# Patient Record
Sex: Male | Born: 1955 | Race: White | Hispanic: No | Marital: Married | Smoking: Former smoker
Health system: Southern US, Community
[De-identification: ages and names within clinical notes are randomized; demographics above are authoritative.]

## PROBLEM LIST (undated history)

## (undated) DIAGNOSIS — F32A Depression, unspecified: Secondary | ICD-10-CM

## (undated) DIAGNOSIS — C159 Malignant neoplasm of esophagus, unspecified: Secondary | ICD-10-CM

## (undated) DIAGNOSIS — C419 Malignant neoplasm of bone and articular cartilage, unspecified: Secondary | ICD-10-CM

## (undated) DIAGNOSIS — G4730 Sleep apnea, unspecified: Secondary | ICD-10-CM

## (undated) DIAGNOSIS — I2699 Other pulmonary embolism without acute cor pulmonale: Secondary | ICD-10-CM

## (undated) DIAGNOSIS — E785 Hyperlipidemia, unspecified: Secondary | ICD-10-CM

## (undated) DIAGNOSIS — F418 Other specified anxiety disorders: Secondary | ICD-10-CM

## (undated) DIAGNOSIS — I10 Essential (primary) hypertension: Secondary | ICD-10-CM

## (undated) DIAGNOSIS — F419 Anxiety disorder, unspecified: Secondary | ICD-10-CM

## (undated) DIAGNOSIS — C801 Malignant (primary) neoplasm, unspecified: Secondary | ICD-10-CM

## (undated) DIAGNOSIS — F329 Major depressive disorder, single episode, unspecified: Secondary | ICD-10-CM

## (undated) HISTORY — DX: Essential (primary) hypertension: I10

## (undated) HISTORY — PX: OTHER SURGICAL HISTORY: SHX169

## (undated) HISTORY — PX: CHOLECYSTECTOMY: SHX55

---

## 1997-07-30 ENCOUNTER — Ambulatory Visit (HOSPITAL_COMMUNITY): Admission: RE | Admit: 1997-07-30 | Discharge: 1997-07-30 | Payer: Self-pay | Admitting: Emergency Medicine

## 1997-07-30 ENCOUNTER — Emergency Department (HOSPITAL_COMMUNITY): Admission: EM | Admit: 1997-07-30 | Discharge: 1997-07-30 | Payer: Self-pay | Admitting: Emergency Medicine

## 1997-09-02 ENCOUNTER — Ambulatory Visit (HOSPITAL_COMMUNITY): Admission: RE | Admit: 1997-09-02 | Discharge: 1997-09-02 | Payer: Self-pay | Admitting: *Deleted

## 2009-11-12 ENCOUNTER — Emergency Department (HOSPITAL_COMMUNITY): Admission: EM | Admit: 2009-11-12 | Discharge: 2009-11-12 | Payer: Self-pay | Admitting: Emergency Medicine

## 2016-02-01 ENCOUNTER — Other Ambulatory Visit: Payer: Self-pay | Admitting: Family Medicine

## 2016-02-01 DIAGNOSIS — R0789 Other chest pain: Secondary | ICD-10-CM

## 2016-02-03 DIAGNOSIS — C801 Malignant (primary) neoplasm, unspecified: Secondary | ICD-10-CM

## 2016-02-03 HISTORY — DX: Malignant (primary) neoplasm, unspecified: C80.1

## 2016-02-09 ENCOUNTER — Ambulatory Visit
Admission: RE | Admit: 2016-02-09 | Discharge: 2016-02-09 | Disposition: A | Discharge status: Home / Self Care | Payer: BLUE CROSS/BLUE SHIELD | Source: Ambulatory Visit | Attending: Family Medicine | Admitting: Family Medicine

## 2016-02-09 DIAGNOSIS — R0789 Other chest pain: Secondary | ICD-10-CM

## 2016-02-09 MED ORDER — IOPAMIDOL (ISOVUE-300) INJECTION 61%
75.0000 mL | Freq: Once | INTRAVENOUS | Status: AC | PRN
  Administered 2016-02-09: 75 mL via INTRAVENOUS

## 2016-02-10 ENCOUNTER — Telehealth: Payer: Self-pay | Admitting: *Deleted

## 2016-02-10 ENCOUNTER — Other Ambulatory Visit: Payer: Self-pay

## 2016-02-10 NOTE — Telephone Encounter (Signed)
Oncology Nurse Navigator Documentation  Oncology Nurse Navigator Flowsheets 02/10/2016  Navigator Location [facility]  Referral date to RadOnc/MedOnc 02/10/2016  Navigator Encounter Type Introductory phone call  Abnormal Finding Date 02/09/2016  Spoke with patient and provided new patient appointment for 02/21/16 at 2:15/2:30 with Dr. Truitt Merle. Informed of location of [place], valet service, and registration process. Reminded to bring photo ID,  insurance cards and a current medication list, including supplements. Patient verbalizes understanding. Informed him that navigator called [facility] and he is scheduled to see Dr. Penelope Coop on 12/13, but if they can speak w/patient before, he may be able to arrange the EGD that same week so results will be available on 12/19 visit. Gave him the phone # for  [facility] and he will call when this conversation is completed. Tells RN he is able to swallow soft foods and liquids. Meat and pizza is difficult to swallow. The pain is managed with the Norco. Notified HIM of appointment.

## 2016-02-20 ENCOUNTER — Telehealth: Payer: Self-pay | Admitting: *Deleted

## 2016-02-20 NOTE — Telephone Encounter (Signed)
Call to [facility] to follow up on endoscopy report from 02/15/16 and pathology. Confirmed the upper endo was done. They will fax report. Pathology is pending. Stressed that we need this report by 12/19 at 2:30 pm when he sees Dr. Burr Medico. Verbal on the path will be adequate if printed report not available.

## 2016-02-21 ENCOUNTER — Ambulatory Visit (HOSPITAL_BASED_OUTPATIENT_CLINIC_OR_DEPARTMENT_OTHER): Payer: BLUE CROSS/BLUE SHIELD

## 2016-02-21 ENCOUNTER — Encounter: Payer: Self-pay | Admitting: *Deleted

## 2016-02-21 ENCOUNTER — Telehealth: Payer: Self-pay | Admitting: Hematology

## 2016-02-21 ENCOUNTER — Ambulatory Visit (HOSPITAL_BASED_OUTPATIENT_CLINIC_OR_DEPARTMENT_OTHER): Payer: BLUE CROSS/BLUE SHIELD | Admitting: Hematology

## 2016-02-21 ENCOUNTER — Encounter: Payer: Self-pay | Admitting: Hematology

## 2016-02-21 VITALS — BP 140/94 | HR 127 | Temp 97.6°F | Resp 16 | Wt 228.0 lb

## 2016-02-21 DIAGNOSIS — C155 Malignant neoplasm of lower third of esophagus: Secondary | ICD-10-CM

## 2016-02-21 DIAGNOSIS — C159 Malignant neoplasm of esophagus, unspecified: Secondary | ICD-10-CM

## 2016-02-21 DIAGNOSIS — R1310 Dysphagia, unspecified: Secondary | ICD-10-CM

## 2016-02-21 DIAGNOSIS — C7951 Secondary malignant neoplasm of bone: Secondary | ICD-10-CM

## 2016-02-21 DIAGNOSIS — R0789 Other chest pain: Secondary | ICD-10-CM

## 2016-02-21 DIAGNOSIS — R634 Abnormal weight loss: Secondary | ICD-10-CM

## 2016-02-21 LAB — CBC WITH DIFFERENTIAL/PLATELET
BASO%: 0.3 % (ref 0.0–2.0)
BASOS ABS: 0 10*3/uL (ref 0.0–0.1)
EOS ABS: 0.7 10*3/uL — AB (ref 0.0–0.5)
EOS%: 6.1 % (ref 0.0–7.0)
HEMATOCRIT: 45.3 % (ref 38.4–49.9)
HEMOGLOBIN: 15.8 g/dL (ref 13.0–17.1)
LYMPH%: 11.7 % — ABNORMAL LOW (ref 14.0–49.0)
MCH: 31.6 pg (ref 27.2–33.4)
MCHC: 34.9 g/dL (ref 32.0–36.0)
MCV: 90.6 fL (ref 79.3–98.0)
MONO#: 0.9 10*3/uL (ref 0.1–0.9)
MONO%: 7.3 % (ref 0.0–14.0)
NEUT#: 8.9 10*3/uL — ABNORMAL HIGH (ref 1.5–6.5)
NEUT%: 74.6 % (ref 39.0–75.0)
Platelets: 376 10*3/uL (ref 140–400)
RBC: 5 10*6/uL (ref 4.20–5.82)
RDW: 12.5 % (ref 11.0–14.6)
WBC: 12 10*3/uL — ABNORMAL HIGH (ref 4.0–10.3)
lymph#: 1.4 10*3/uL (ref 0.9–3.3)

## 2016-02-21 LAB — COMPREHENSIVE METABOLIC PANEL
ALBUMIN: 3.5 g/dL (ref 3.5–5.0)
ALK PHOS: 123 U/L (ref 40–150)
ALT: 55 U/L (ref 0–55)
AST: 47 U/L — ABNORMAL HIGH (ref 5–34)
Anion Gap: 11 mEq/L (ref 3–11)
BUN: 21.3 mg/dL (ref 7.0–26.0)
CALCIUM: 14.6 mg/dL — AB (ref 8.4–10.4)
CHLORIDE: 97 meq/L — AB (ref 98–109)
CO2: 32 mEq/L — ABNORMAL HIGH (ref 22–29)
Creatinine: 1.1 mg/dL (ref 0.7–1.3)
EGFR: 73 mL/min/{1.73_m2} — AB (ref >90)
Glucose: 110 mg/dl (ref 70–140)
POTASSIUM: 4 meq/L (ref 3.5–5.1)
SODIUM: 139 meq/L (ref 136–145)
Total Bilirubin: 0.6 mg/dL (ref 0.20–1.20)
Total Protein: 8.4 g/dL — ABNORMAL HIGH (ref 6.4–8.3)

## 2016-02-21 MED ORDER — MORPHINE SULFATE (CONCENTRATE) 10 MG/0.5ML PO SOLN: 5.0000 mg | ORAL | 0 refills | Status: DC | PRN

## 2016-02-21 MED FILL — MORPHINE SULF 100 MG/5 ML S: 100 | 20 days supply | Qty: 30 | Fill #0

## 2016-02-21 NOTE — Patient Instructions (Signed)
Care Plan Summary- 02/21/2016 Name:  Walter Craig      DOB:  03/18/1955 Your Medical Team:  Medical Oncologist:  Dr. Truitt Merle Radiation Oncologist:   Surgeon:    Type of Cancer: Esophageal Cancer  Stage/Grade: Stage IV *Exact staging of your cancer is based on size of the tumor, depth of invasion, involvement of lymph nodes or not, and whether or not the cancer has spread beyond the primary site   Recommendations: Based on information available as of today's consult. Recommendations may change depending on the results of further tests or exams. 1) Complete staging with PET and labs 2) Chemotherapy with FOLFOX (5FU, Leucovorin, Oxaliplatin) every 2 weeks (start week of January) 3) Work on your pain control-monitor for constipation Next Steps: 1) Labs today; PET scan in next 1-2 weeks (after authorized) 2)  Interventional radiology for port-they will call 3)  Chemo education class and see Dr. Burr Medico on 12/26 4)   Will work on dietician to see you soon     Questions? Merceda Elks, RN, BSN at [phone]. Manuela Schwartz is your Oncology Nurse Navigator and is available to assist you while you're receiving your medical care at [facility].

## 2016-02-21 NOTE — Telephone Encounter (Signed)
Lab added for today, per 02/21/16 los. Chemo class and follow up with Dr Burr Medico, was scheduled for 02/28/16. Nutrition appointment scheduled for 02/29/16 per 02/21/16 los. No orders entered for [facility] placement. Spoke with desk nurse, Janifer Adie, she stated that she will take care of it. Appointments scheduled per 02/21/16 los. A copy of the appointment schedule and AVS report, was given to the patient, per 02/21/16 los.

## 2016-02-21 NOTE — Progress Notes (Signed)
[place]  Telephone:([phone]) [phone] Fax:([phone]) [place] Note   Patient Care Team: Hulan Fess, MD as PCP - General (Family Medicine) Wonda Horner, MD as Consulting Physician (Gastroenterology) 02/21/2016  REFFERAL PHYSICIAN:  DR. Penelope Coop    No history exists.    CHIEF COMPLAINTS/PURPOSE OF CONSULTATION:  Newly diagnosed esophageal cancer   HISTORY OF PRESENTING ILLNESS:  Walter Craig 60 y.o. male is here because of His recently diagnosed metastatic esophageal cancer. He is accompanied by his sister-in-law to my clinic today. His daughter was on the phone during the clinic visit also.  He presented with bilateral chest and back pain in early February 08, 2023, after his brother's death. He was seen by his PCP. He had CXR which was negative and he took NSAIDs for the pain. He was referred to PT, bu this pain did not improve. A CT scan was subsequently obtained, which showed distal surgery. Thoracic esophagus masslike soft tissue thickening and irregularity up to his 2 cm, multiple mediastinal and osseous metastasis compatible with stage IV esophageal cancer. He was referred to S retinologist Dr. Penelope Coop, underwent EGD on 02/15/2016 which showed esophageal mucosal change consistent with long segment Barrett's esophagus, a large malignant mass was found in the distal esophagus, partially obstructing and 3/4 circumferential. Biopsies showed invasive adenocarcinoma.  He denies any dysphagia, he recently developed mid chest pain when he eats lately, better with soft and liquid diet. Pain 9/10, he takes hydrocordone 2 tab a day, not well controlled. He still works, he is a Chiropractor, desk job   He had not had BM for 8 days, probably secondary to his pain medication.  He has good appetite, but eats less due to dysphagea, he lost about 20 lbs in the past few months   He has three chidren, his daughter lives in [place], 2 sons living in [place]. His  brother Inocente Salles recently died from clinical carcinoma, and [name] is currently receiving Keytruda for her cholangiocarcinoma.  MEDICAL HISTORY:  Past Medical History:  Diagnosis Date  . Hypertension     SURGICAL HISTORY: Past Surgical History:  Procedure Laterality Date  . CHOLECYSTECTOMY    . left knee meniscus repare       SOCIAL HISTORY: Social History   Social History  . Marital status: Married    Spouse name: N/A  . Number of children: N/A  . Years of education: N/A   Occupational History  . Not on file.   Social History Main Topics  . Smoking status: Former Smoker    Packs/day: 1.50    Years: 30.00    Quit date: 03/05/2004  . Smokeless tobacco: Never Used  . Alcohol use 1.8 - 2.4 oz/week    1 Shots of liquor, 2 - 3 Cans of beer per week     Comment: daily   . Drug use:     Types: Marijuana  . Sexual activity: Not on file   Other Topics Concern  . 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  ALLERGIES:  has no allergies on file.  MEDICATIONS:  Current Outpatient Prescriptions  Medication Sig Dispense Refill  . pravastatin (PRAVACHOL) 80 MG tablet Take 80 mg by mouth daily.    Marland Kitchen ALPRAZolam (XANAX) 1 MG tablet Take 1 mg by mouth at bedtime  as needed.    . diclofenac (VOLTAREN) 75 MG EC tablet Take 1 tablet by mouth 2 (two) times daily as needed.  1  . FLUTICASONE FUROATE-VILANTEROL IN Inhale into the lungs.    . hydrochlorothiazide (HYDRODIURIL) 25 MG tablet Take 25 mg by mouth daily.  1  . HYDROcodone-acetaminophen (NORCO) 10-325 MG tablet Take 1 tablet by mouth 2 (two) times daily as needed.  0  . metoprolol succinate (TOPROL-XL) 25 MG 24 hr tablet Take 1 tablet by mouth daily.  1  . zolpidem (AMBIEN) 10 MG tablet Take 10 mg by mouth at bedtime as needed.  0   No current facility-administered medications for this  visit.     REVIEW OF SYSTEMS:   Constitutional: Denies fevers, chills or abnormal night sweats Eyes: Denies blurriness of vision, double vision or watery eyes Ears, nose, mouth, throat, and face: Denies mucositis or sore throat Respiratory: Denies cough, dyspnea or wheezes Cardiovascular: Denies palpitation, chest discomfort or lower extremity swelling Gastrointestinal:  Denies nausea, heartburn or change in bowel habits Skin: Denies abnormal skin rashes Lymphatics: Denies new lymphadenopathy or easy bruising Neurological:Denies numbness, tingling or new weaknesses Behavioral/Psych: Mood is stable, no new changes  All other systems were reviewed with the patient and are negative.  PHYSICAL EXAMINATION: ECOG PERFORMANCE STATUS: 1 - Symptomatic but completely ambulatory  Vitals:   02/21/16 1448  BP: (!) 140/94  Pulse: (!) 127  Resp: 16  Temp: 97.6 F (36.4 C)   Filed Weights   02/21/16 1448  Weight: 228 lb (103.4 kg)    GENERAL:alert, no distress and comfortable SKIN: skin color, texture, turgor are normal, no rashes or significant lesions EYES: normal, conjunctiva are pink and non-injected, sclera clear OROPHARYNX:no exudate, no erythema and lips, buccal mucosa, and tongue normal  NECK: supple, thyroid normal size, non-tender, without nodularity LYMPH:  no palpable lymphadenopathy in the cervical, axillary or inguinal LUNGS: clear to auscultation and percussion with normal breathing effort HEART: regular rate & rhythm and no murmurs and no lower extremity edema ABDOMEN:abdomen soft, non-tender and normal bowel sounds Musculoskeletal:no cyanosis of digits and no clubbing  PSYCH: alert & oriented x 3 with fluent speech NEURO: no focal motor/sensory deficits  LABORATORY DATA:  I have reviewed the data as listed CBC Latest Ref Rng & Units 02/21/2016  WBC 4.0 - 10.3 10e3/uL 12.0(H)  Hemoglobin 13.0 - 17.1 g/dL 15.8  Hematocrit 38.4 - 49.9 % 45.3  Platelets 140 - 400  10e3/uL 376     CMP Latest Ref Rng & Units 02/21/2016  Glucose 70 - 140 mg/dl 110  BUN 7.0 - 26.0 mg/dL 21.3  Creatinine 0.7 - 1.3 mg/dL 1.1  Sodium 136 - 145 mEq/L 139  Potassium 3.5 - 5.1 mEq/L 4.0  CO2 22 - 29 mEq/L 32(H)  Calcium 8.4 - 10.4 mg/dL 14.6(HH)  Total Protein 6.4 - 8.3 g/dL 8.4(H)  Total Bilirubin 0.20 - 1.20 mg/dL 0.60  Alkaline Phos 40 - 150 U/L 123  AST 5 - 34 U/L 47(H)  ALT 0 - 55 U/L 55    PATHOLOGY REPORT  Diagnosis 02/15/2016 Adenocarcinoma present involving at least laminal propria and arising in background of intestinal metaplasia associated high-grade granular dysplasia. Positive for lymphovascular invasion.   RADIOGRAPHIC STUDIES: I have personally reviewed the radiological images as listed and agreed with the findings in the report. Ct Chest W Contrast  Addendum Date: 02/09/2016   ADDENDUM REPORT: 02/09/2016 15:42 ADDENDUM: Study discussed by telephone with Dr. Lennette Bihari LITTLE on 02/09/2016 At  1528 hours. Electronically Signed   By: Genevie Ann M.D.   On: 02/09/2016 15:42   Result Date: 02/09/2016 CLINICAL DATA:  60 year old male with chest wall pain, she bilateral shoulder blade pain, low back pain. Symptoms for 3-4 weeks. Former smoker. Initial encounter. EXAM: CT CHEST WITH CONTRAST TECHNIQUE: Multidetector CT imaging of the chest was performed during intravenous contrast administration. CONTRAST:  54m ISOVUE-300 IOPAMIDOL (ISOVUE-300) INJECTION 61% COMPARISON:  Chest radiographs 01/05/2016. FINDINGS: Cardiovascular: Calcified coronary artery atherosclerosis. Minimal calcified aortic atherosclerosis. No pericardial effusion. Mediastinum/Nodes: Bulky and eccentric soft tissue thickening in the lower third of the thoracic esophagus which appears irregular and masslike. See sagittal image 100. Soft tissue thickening up to 2 cm. Associated posterior and subcarinal mediastinal lymphadenopathy, with increased size and number of lymph nodes. Nodes individually up to  13-16 mm short axis. Superimposed mild AP window and moderate right peritracheal adenopathy (right peritracheal node up to 15 mm). Lungs/Pleura: Major airways are patent. Areas of curvilinear subpleural and perihilar lung scarring mostly on the left. Two small 3-4 mm indeterminate left lung nodules (series 5, image 78 and image 91). No pleural effusion. Small probable intrapleural lymph node at the confluence of the right major and minor fissures (series 5, image 79 and coronal image 90). Other subtle nodularity along the pleural fissures is indeterminate. Upper Abdomen: The gastroesophageal junction appears normal. Negative visible stomach and other bowel in the upper abdomen. Mild retrocrural lymphadenopathy. Surgically absent gallbladder. Negative visible liver, spleen, pancreas, adrenal glands and kidneys. Musculoskeletal: Osseous metastatic disease. Destructive 3 x 5 cm lesion of the posterior right fifth rib (series 2, image 48). Lytic lesion of the posterior right seventh rib. Nearby mildly displaced right posterior lateral seventh rib fracture might be pathologic (series 5, image 77). 2.5 x 3.5 lytic metastasis anterior left third rib. Smaller lytic metastasis with pathologic fracture posterior left fifth rib (series 2, image 59). Mildly displaced probable pathologic fracture posterior lateral left third rib (image 43). Scattered vertebral metastases including multiple small lytic vertebral body metastases in the thoracic spine (e.g. T4 series 4, image 106). Larger lytic left T11 metastasis (series 2, image 125) without strong evidence of epidural extension. Similar lytic posterior element metastasis right L1 vertebra (series 2, image 154). IMPRESSION: 1. Constellation of abnormal posterior mediastinum and lytic osseous metastatic disease most compatible with stage IV esophageal carcinoma. 2. Distal third thoracic esophagus masslike soft tissue thickening and irregularity up to 2 cm. See sagittal image 100.  Recommend EGD. 3. Multiple destructive rib metastases with pathologic fractures. Multiple thoracic and upper lumbar vertebral metastases, though none with suspected epidural extension at this time. Electronically Signed: By: HGenevie AnnM.D. On: 02/09/2016 15:20    ASSESSMENT & PLAN:   60year old Caucasian male, with past medical history of hypertension, presented with bilateral chest pain and dysphagia  1. 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 -I recommend a PET scan for staging -I reviewed the natural history of metastatic esophageal cancer, unfortunately this is very aggressive malignancy, and it is not curable at this stage. His overall prognosis is very poor. -We discussed the role of palliative radiation, for his dysphagia and pain from bone metastasis. -Due  to the diffuse symptomatic metastasis, and he is still able to tolerate soft diet and adequate, I recommend him to start systemic chemotherapy as soon as possible. -We discussed the systemic therapy options, including chemotherapy, such as FOLFOX, carboplatin and Taxol, and immunotherapy Keytruda if his tumor expressed PD-L1, and biological agent such as Herceptin if his tumor expressed HER-2. -I will contact his path lab and order PD-L1 and HER2 IHC -Given his adenocarcinoma histology, I recommend him to start the first-line chemotherapy with FOLFOX --Chemotherapy consent: Side effects including but does not not limited to, fatigue, nausea, vomiting, diarrhea, hair loss, neuropathy, fluid retention, renal and kidney dysfunction, neutropenic fever, needed for blood transfusion,  bleeding, coronary artery spasm and heart attack were discussed with patient in great detail. He agrees to proceed. -Port placement -I'll tentatively starting his chemotherapy in 1-2 weeks   2. Hypercalcemia -His calcium is 14.6 today, likely secondary to his underlying malignancy -He has no mental status change, I recommend Zometa infusion tomorrow, along with IV fluids -On follow-up for his calcium level closely  3. Dysphagia, weight loss -I encouraged him to take nutrition supplement, such as ensure boost -Dietitian consult  4. Chest pain -Secondary to his bone metastasis -I recommend him to start morphine 5-10 mg every 6 hours as needed   *Recommendations: Based on information available as of today's consult. Recommendations may change depending on the results of further tests or exams. 1) Complete staging with PET and labs 2) Chemotherapy with FOLFOX (5FU, Leucovorin, Oxaliplatin) every 2 weeks (start week of January) 3) Work on your pain control-monitor for constipation Next Steps: 1) Labs today; PET scan in next 1-2 weeks (after authorized) 2)  Interventional radiology for port-they will call 3)  Chemo education class and see Dr. Burr Medico on 12/26 4)   Will work on dietician to see you soon ** No orders of the defined types were placed in this encounter.   All questions were answered. The patient knows to call the clinic with any problems, questions or concerns. I spent 55 minutes counseling the patient face to face. The total time spent in the appointment was 60 minutes and more than 50% was on counseling.     Truitt Merle, MD 02/21/2016 3:25 PM

## 2016-02-21 NOTE — Progress Notes (Signed)
Oncology Nurse Navigator Documentation  Oncology Nurse Navigator Flowsheets 02/21/2016  Navigator Location [facility]  Referral date to RadOnc/MedOnc -  Navigator Encounter Type Initial MedOnc  Abnormal Finding Date -  Patient Visit Type MedOnc;Initial  Treatment Phase Pre-Tx/Tx Discussion  Barriers/Navigation Needs Education;Coordination of Care  Education Newly Diagnosed Cancer Education;Pain/ Symptom Management;Understanding Cancer/ Treatment Options;Preparing for Upcoming Treatment  Interventions Education;Coordination of Care  Coordination of Care Radiology  Education Method Verbal;Written;Teach-back  Support Groups/Services GI Support Group;[place]  Acuity Level 2  Time Spent with Patient 30  Met with patient and sister-in-law, Manuela Schwartz after new patient visit. Explained the role of the GI Nurse Navigator and provided New Patient Packet with information on: 1.  Esophagus  Cancer--info provided on CEA test, PET scan, PAC and showed him what port looks like 2. Support groups 3. Advanced Directives 4. Fall Safety Plan Answered questions, reviewed current treatment plan using TEACH back and provided emotional support. Provided copy of current treatment plan. Discussed procedure for PET and needing prior authorization (message sent to managed care) and he is willing to go to [facility] if necessary. Informed him navigator will call him with the [facility] information and try to move his dietician appointment sooner as well. Currently he can swallow liquids and soft foods like yogurt, grits, puddings. Has lost 15-20 pounds since October according to his sister-in-law who has cholangiocarcinoma and is being treated in [place]. She adds that he has lost two siblings this year to cancer as well.   Merceda Elks, RN, BSN GI Oncology [place]

## 2016-02-22 ENCOUNTER — Ambulatory Visit (HOSPITAL_BASED_OUTPATIENT_CLINIC_OR_DEPARTMENT_OTHER): Payer: BLUE CROSS/BLUE SHIELD

## 2016-02-22 ENCOUNTER — Ambulatory Visit: Payer: BLUE CROSS/BLUE SHIELD | Admitting: Nutrition

## 2016-02-22 ENCOUNTER — Telehealth: Payer: Self-pay | Admitting: *Deleted

## 2016-02-22 ENCOUNTER — Encounter: Payer: Self-pay | Admitting: *Deleted

## 2016-02-22 VITALS — BP 156/97 | HR 121 | Temp 98.4°F | Resp 18

## 2016-02-22 DIAGNOSIS — C7951 Secondary malignant neoplasm of bone: Secondary | ICD-10-CM | POA: Diagnosis not present

## 2016-02-22 DIAGNOSIS — C159 Malignant neoplasm of esophagus, unspecified: Secondary | ICD-10-CM | POA: Insufficient documentation

## 2016-02-22 LAB — CEA (IN HOUSE-CHCC): CEA (CHCC-IN HOUSE): 17.75 ng/mL — AB (ref 0.00–5.00)

## 2016-02-22 MED ORDER — SODIUM CHLORIDE 0.9 % IV SOLN
Freq: Once | INTRAVENOUS | Status: AC
  Administered 2016-02-22: 13:00:00 via INTRAVENOUS

## 2016-02-22 MED ORDER — ZOLEDRONIC ACID 4 MG/100ML IV SOLN
4.0000 mg | Freq: Once | INTRAVENOUS | Status: AC
  Administered 2016-02-22: 4 mg via INTRAVENOUS
  Filled 2016-02-22: qty 100

## 2016-02-22 MED ORDER — HEPARIN SOD (PORK) LOCK FLUSH 100 UNIT/ML IV SOLN
500.0000 [IU] | Freq: Once | INTRAVENOUS | Status: AC | PRN
  Administered 2016-02-22: 500 [IU]
  Filled 2016-02-22: qty 5

## 2016-02-22 MED ORDER — SODIUM CHLORIDE 0.9% FLUSH
10.0000 mL | INTRAVENOUS | Status: DC | PRN
  Administered 2016-02-22: 10 mL
  Filled 2016-02-22: qty 10

## 2016-02-22 NOTE — Telephone Encounter (Signed)
Left VM for [facility] IR requesting port in 1-2 weeks. No blood thinners. Will be in office at 12:30 today and navigator can provide information at this time. Left direct # for return call.

## 2016-02-22 NOTE — Telephone Encounter (Signed)
Spoke with patient and let him know that labs from yesterday showed that his calcium is very high and we need to correct this.  He will come today at 12:15 for zometa and additional IVF.

## 2016-02-22 NOTE — Patient Instructions (Signed)
Dehydration, Adult Dehydration is when there is not enough fluid or water in your body. This happens when you lose more fluids than you take in. Dehydration can range from mild to very bad. It should be treated right away to keep it from getting very bad. Symptoms of mild dehydration may include:   Thirst.  Dry lips.  Slightly dry mouth.  Dry, warm skin.  Dizziness. Symptoms of moderate dehydration may include:   Very dry mouth.  Muscle cramps.  Dark pee (urine). Pee may be the color of tea.  Your body making less pee.  Your eyes making fewer tears.  Heartbeat that is uneven or faster than normal (palpitations).  Headache.  Light-headedness, especially when you stand up from sitting.  Fainting (syncope). Symptoms of very bad dehydration may include:   Changes in skin, such as:  Cold and clammy skin.  Blotchy (mottled) or pale skin.  Skin that does not quickly return to normal after being lightly pinched and let go (poor skin turgor).  Changes in body fluids, such as:  Feeling very thirsty.  Your eyes making fewer tears.  Not sweating when body temperature is high, such as in hot weather.  Your body making very little pee.  Changes in vital signs, such as:  Weak pulse.  Pulse that is more than 100 beats a minute when you are sitting still.  Fast breathing.  Low blood pressure.  Other changes, such as:  Sunken eyes.  Cold hands and feet.  Confusion.  Lack of energy (lethargy).  Trouble waking up from sleep.  Short-term weight loss.  Unconsciousness. Follow these instructions at home:  If told by your doctor, drink an ORS:  Make an ORS by using instructions on the package.  Start by drinking small amounts, about  cup (120 mL) every 5-10 minutes.  Slowly drink more until you have had the amount that your doctor said to have.  Drink enough clear fluid to keep your pee clear or pale yellow. If you were told to drink an ORS, finish the  ORS first, then start slowly drinking clear fluids. Drink fluids such as:  Water. Do not drink only water by itself. Doing that can make the salt (sodium) level in your body get too low (hyponatremia).  Ice chips.  Fruit juice that you have added water to (diluted).  Low-calorie sports drinks.  Avoid:  Alcohol.  Drinks that have a lot of sugar. These include high-calorie sports drinks, fruit juice that does not have water added, and soda.  Caffeine.  Foods that are greasy or have a lot of fat or sugar.  Take over-the-counter and prescription medicines only as told by your doctor.  Do not take salt tablets. Doing that can make the salt level in your body get too high (hypernatremia).  Eat foods that have minerals (electrolytes). Examples include bananas, oranges, potatoes, tomatoes, and spinach.  Keep all follow-up visits as told by your doctor. This is important. Contact a doctor if:  You have belly (abdominal) pain that:  Gets worse.  Stays in one area (localizes).  You have a rash.  You have a stiff neck.  You get angry or annoyed more easily than normal (irritability).  You are more sleepy than normal.  You have a harder time waking up than normal.  You feel:  Weak.  Dizzy.  Very thirsty.  You have peed (urinated) only a small amount of very dark pee during 6-8 hours. Get help right away if:  You   have symptoms of very bad dehydration.  You cannot drink fluids without throwing up (vomiting).  Your symptoms get worse with treatment.  You have a fever.  You have a very bad headache.  You are throwing up or having watery poop (diarrhea) and it:  Gets worse.  Does not go away.  You have blood or something green (bile) in your throw-up.  You have blood in your poop (stool). This may cause poop to look black and tarry.  You have not peed in 6-8 hours.  You pass out (faint).  Your heart rate when you are sitting still is more than 100 beats a  minute.  You have trouble breathing. This information is not intended to replace advice given to you by your health care provider. Make sure you discuss any questions you have with your health care provider. Document Released: 12/16/2008 Document Revised: 09/09/2015 Document Reviewed: 04/15/2015 Elsevier Interactive Patient Education  2017 [employer]vier Inc[employer]  

## 2016-02-22 NOTE — Progress Notes (Signed)
Oncology Nurse Navigator Documentation  Oncology Nurse Navigator Flowsheets 02/22/2016  Navigator Location [facility]  Referral date to RadOnc/MedOnc -  Navigator Encounter Type Treatment--IV fluids and zometa  Telephone -  Abnormal Finding Date -  Patient Visit Type MedOnc  Treatment Phase -  Barriers/Navigation Needs Coordination of Care;Education  Education Pain/ Symptom Management;Preparing for Upcoming Surgery/ Treatment--pain 8 before morphine and down to 5 afterwards. OK per MD to increase dose to 0.5 ml every 4 hours as needed and patient informed.  Interventions Coordination of Care  Coordination of Care Radiology--PAC scheduled for 12/22-provided appointment information and prep including need for driver  Education Method Verbal;Teach-back  Support Groups/Services -  Acuity -  Time Spent with Patient 15  Voice mail from daughter, Roxanna Mew left earlier today requesting to speak with navigator regarding treatment plans and prognosis [phone]). Attempted return call-no answer. Left VM that navigator called.

## 2016-02-22 NOTE — Progress Notes (Signed)
60 year old male diagnosed with esophageal cancer. He will receive FOLFOX.  He is a patient of Dr. Burr Medico.  Past medical history includes hypertension.  Medications include Xanax  Labs include albumin 3.5.  Height: 5 feet 10 inches. Weight: 228 pounds. Usual body weight: 242 pounds. BMI: 32.71.  Patient reports dysphagia with most foods but tolerates smooth foods such as applesauce and yogurt. He has been drinking smoothies every day. He would like to try a variety of oral nutrition supplements. He endorses 20 pound weight loss.  Nutrition diagnosis:  Unintended weight loss related to new diagnosis of esophagus cancer as evidenced by 6% weight loss from usual body weight.  Intervention: Educated patient to increase calories and protein utilizing high-calorie, high-protein foods in small frequent meals and snacks. Educated patient on pureing soft foods for increased tolerance. Recipes provided. Encouraged patient to continue oral nutrition supplements and provided samples and coupons. Questions were answered.  Teach back method used.  Contact information provided.  Monitoring, evaluation, goals: Patient will tolerate increased calories and protein to minimize weight loss.  Next visit: To be scheduled as needed.  **Disclaimer: This note was dictated with voice recognition software. Similar sounding words can inadvertently be transcribed and this note may contain transcription errors which may not have been corrected upon publication of note.**

## 2016-02-22 NOTE — Telephone Encounter (Signed)
Received call from patient's dtr Janett Billow.  She had questions re treatment and prognosis  in follow-up to his appt with Dr. Burr Medico yesterday.  I provided her [facility] phone number.  Gayleen Orem, RN, BSN, [facility] Nurse [place] [facility] [place] [phone]

## 2016-02-23 ENCOUNTER — Encounter: Payer: Self-pay | Admitting: Hematology

## 2016-02-23 ENCOUNTER — Telehealth: Payer: Self-pay | Admitting: *Deleted

## 2016-02-23 ENCOUNTER — Other Ambulatory Visit: Payer: Self-pay | Admitting: Hematology

## 2016-02-23 ENCOUNTER — Other Ambulatory Visit: Payer: Self-pay | Admitting: General Surgery

## 2016-02-23 DIAGNOSIS — C159 Malignant neoplasm of esophagus, unspecified: Secondary | ICD-10-CM

## 2016-02-23 NOTE — Telephone Encounter (Signed)
Call from daughter asking for information on how his treatments will go and when will he need someone to stay with him. She [place] of [place] and wants to be here to help during his potential "down" times. She would also want to be here for the 1st chemo treatment. Made her aware that he will most likely be feeling most of his fatigue on day 3-5 after the treatment, however many patients are still able to function normally and work. Expressed concern that waiting till next week or following is too long-assured her this will not impact his survival. Explained that the cancer has been in his body to some extent for many months. She is asking to speak with MD regarding prognosis and what/when plans need to be made. Forwarded her contact information to Dr. Burr Medico.

## 2016-02-24 ENCOUNTER — Other Ambulatory Visit: Payer: Self-pay | Admitting: Hematology

## 2016-02-24 ENCOUNTER — Ambulatory Visit (HOSPITAL_COMMUNITY)
Admission: RE | Admit: 2016-02-24 | Discharge: 2016-02-24 | Disposition: A | Discharge status: Home / Self Care | Payer: BLUE CROSS/BLUE SHIELD | Source: Ambulatory Visit | Attending: Hematology | Admitting: Hematology

## 2016-02-24 ENCOUNTER — Encounter (HOSPITAL_COMMUNITY): Payer: Self-pay

## 2016-02-24 DIAGNOSIS — Z87891 Personal history of nicotine dependence: Secondary | ICD-10-CM | POA: Diagnosis not present

## 2016-02-24 DIAGNOSIS — Z808 Family history of malignant neoplasm of other organs or systems: Secondary | ICD-10-CM | POA: Diagnosis not present

## 2016-02-24 DIAGNOSIS — Z800 Family history of malignant neoplasm of digestive organs: Secondary | ICD-10-CM | POA: Insufficient documentation

## 2016-02-24 DIAGNOSIS — I10 Essential (primary) hypertension: Secondary | ICD-10-CM | POA: Insufficient documentation

## 2016-02-24 DIAGNOSIS — C159 Malignant neoplasm of esophagus, unspecified: Secondary | ICD-10-CM

## 2016-02-24 DIAGNOSIS — C7951 Secondary malignant neoplasm of bone: Secondary | ICD-10-CM | POA: Insufficient documentation

## 2016-02-24 DIAGNOSIS — C155 Malignant neoplasm of lower third of esophagus: Secondary | ICD-10-CM | POA: Insufficient documentation

## 2016-02-24 DIAGNOSIS — F1290 Cannabis use, unspecified, uncomplicated: Secondary | ICD-10-CM | POA: Diagnosis not present

## 2016-02-24 DIAGNOSIS — G4730 Sleep apnea, unspecified: Secondary | ICD-10-CM | POA: Insufficient documentation

## 2016-02-24 HISTORY — DX: Sleep apnea, unspecified: G47.30

## 2016-02-24 HISTORY — DX: Malignant neoplasm of esophagus, unspecified: C15.9

## 2016-02-24 HISTORY — PX: IR GENERIC HISTORICAL: IMG1180011

## 2016-02-24 HISTORY — DX: Malignant (primary) neoplasm, unspecified: C80.1

## 2016-02-24 LAB — CBC WITH DIFFERENTIAL/PLATELET
Basophils Absolute: 0 10*3/uL (ref 0.0–0.1)
Basophils Relative: 0 %
EOS PCT: 1 %
Eosinophils Absolute: 0 10*3/uL (ref 0.0–0.7)
HCT: 40.3 % (ref 39.0–52.0)
Hemoglobin: 14.4 g/dL (ref 13.0–17.0)
LYMPHS ABS: 0.7 10*3/uL (ref 0.7–4.0)
LYMPHS PCT: 8 %
MCH: 31.5 pg (ref 26.0–34.0)
MCHC: 35.7 g/dL (ref 30.0–36.0)
MCV: 88.2 fL (ref 78.0–100.0)
MONO ABS: 0.4 10*3/uL (ref 0.1–1.0)
MONOS PCT: 5 %
Neutro Abs: 7.4 10*3/uL (ref 1.7–7.7)
Neutrophils Relative %: 86 %
PLATELETS: 229 10*3/uL (ref 150–400)
RBC: 4.57 MIL/uL (ref 4.22–5.81)
RDW: 12.4 % (ref 11.5–15.5)
WBC: 8.6 10*3/uL (ref 4.0–10.5)

## 2016-02-24 LAB — BASIC METABOLIC PANEL
Anion gap: 13 (ref 5–15)
BUN: 24 mg/dL — AB (ref 6–20)
CHLORIDE: 95 mmol/L — AB (ref 101–111)
CO2: 25 mmol/L (ref 22–32)
Calcium: 9.7 mg/dL (ref 8.9–10.3)
Creatinine, Ser: 1.18 mg/dL (ref 0.61–1.24)
GFR calc Af Amer: >60 mL/min (ref >60)
GFR calc non Af Amer: >60 mL/min (ref >60)
GLUCOSE: 128 mg/dL — AB (ref 65–99)
POTASSIUM: 3.2 mmol/L — AB (ref 3.5–5.1)
Sodium: 133 mmol/L — ABNORMAL LOW (ref 135–145)

## 2016-02-24 LAB — PROTIME-INR
INR: 1.11
Prothrombin Time: 14.3 seconds (ref 11.4–15.2)

## 2016-02-24 MED ORDER — HEPARIN SOD (PORK) LOCK FLUSH 100 UNIT/ML IV SOLN
INTRAVENOUS | Status: DC | PRN
  Administered 2016-02-24: 500 [IU] via INTRAVENOUS

## 2016-02-24 MED ORDER — LIDOCAINE HCL 1 % IJ SOLN
INTRAMUSCULAR | Status: AC
  Administered 2016-02-24: 10 mL
  Filled 2016-02-24: qty 20

## 2016-02-24 MED ORDER — FENTANYL CITRATE (PF) 100 MCG/2ML IJ SOLN
INTRAMUSCULAR | Status: AC | PRN
  Administered 2016-02-24: 50 ug via INTRAVENOUS
  Administered 2016-02-24: 25 ug via INTRAVENOUS

## 2016-02-24 MED ORDER — LIDOCAINE-EPINEPHRINE (PF) 2 %-1:200000 IJ SOLN
INTRAMUSCULAR | Status: AC
  Administered 2016-02-24: 10 mL
  Filled 2016-02-24: qty 20

## 2016-02-24 MED ORDER — MIDAZOLAM HCL 2 MG/2ML IJ SOLN
INTRAMUSCULAR | Status: AC | PRN
  Administered 2016-02-24 (×3): 1 mg via INTRAVENOUS

## 2016-02-24 MED ORDER — MIDAZOLAM HCL 2 MG/2ML IJ SOLN
INTRAMUSCULAR | Status: AC
  Filled 2016-02-24: qty 6

## 2016-02-24 MED ORDER — FENTANYL CITRATE (PF) 100 MCG/2ML IJ SOLN
INTRAMUSCULAR | Status: DC | PRN
  Administered 2016-02-24: 25 ug via INTRAVENOUS

## 2016-02-24 MED ORDER — MIDAZOLAM HCL 2 MG/2ML IJ SOLN
INTRAMUSCULAR | Status: DC | PRN
  Administered 2016-02-24: 1 mg via INTRAVENOUS

## 2016-02-24 MED ORDER — HEPARIN SOD (PORK) LOCK FLUSH 100 UNIT/ML IV SOLN
INTRAVENOUS | Status: AC
  Filled 2016-02-24: qty 5

## 2016-02-24 MED ORDER — FENTANYL CITRATE (PF) 100 MCG/2ML IJ SOLN
INTRAMUSCULAR | Status: AC
  Filled 2016-02-24: qty 4

## 2016-02-24 MED ORDER — SODIUM CHLORIDE 0.9 % IV SOLN
INTRAVENOUS | Status: DC
  Administered 2016-02-24: 12:00:00 via INTRAVENOUS

## 2016-02-24 MED ORDER — CEFAZOLIN SODIUM-DEXTROSE 2-4 GM/100ML-% IV SOLN
2.0000 g | INTRAVENOUS | Status: AC
  Administered 2016-02-24: 2 g via INTRAVENOUS
  Filled 2016-02-24: qty 100

## 2016-02-24 NOTE — Discharge Instructions (Signed)
Implanted Port Home Guide °An implanted port is a type of central line that is placed under the skin. Central lines are used to provide IV access when treatment or nutrition needs to be given through a person's veins. Implanted ports are used for long-term IV access. An implanted port may be placed because:  °· You need IV medicine that would be irritating to the small veins in your hands or arms.   °· You need long-term IV medicines, such as antibiotics.   °· You need IV nutrition for a long period.   °· You need frequent blood draws for lab tests.   °· You need dialysis.   °Implanted ports are usually placed in the chest area, but they can also be placed in the upper arm, the abdomen, or the leg. An implanted port has two main parts:  °· Reservoir. The reservoir is round and will appear as a small, raised area under your skin. The reservoir is the part where a needle is inserted to give medicines or draw blood.   °· Catheter. The catheter is a thin, flexible tube that extends from the reservoir. The catheter is placed into a large vein. Medicine that is inserted into the reservoir goes into the catheter and then into the vein.   °HOW WILL I CARE FOR MY INCISION SITE? °Do not get the incision site wet. Bathe or shower as directed by your health care provider.  °HOW IS MY PORT ACCESSED? °Special steps must be taken to access the port:  °· Before the port is accessed, a numbing cream can be placed on the skin. This helps numb the skin over the port site.   °· Your health care provider uses a sterile technique to access the port. °¨ Your health care provider must put on a mask and sterile gloves. °¨ The skin over your port is cleaned carefully with an antiseptic and allowed to dry. °¨ The port is gently pinched between sterile gloves, and a needle is inserted into the port. °· Only "non-coring" port needles should be used to access the port. Once the port is accessed, a blood return should be checked. This helps  ensure that the port is in the vein and is not clogged.   °· If your port needs to remain accessed for a constant infusion, a clear (transparent) bandage will be placed over the needle site. The bandage and needle will need to be changed every week, or as directed by your health care provider.   °· Keep the bandage covering the needle clean and dry. Do not get it wet. Follow your health care provider's instructions on how to take a shower or bath while the port is accessed.   °· If your port does not need to stay accessed, no bandage is needed over the port.   °WHAT IS FLUSHING? °Flushing helps keep the port from getting clogged. Follow your health care provider's instructions on how and when to flush the port. Ports are usually flushed with saline solution or a medicine called heparin. The need for flushing will depend on how the port is used.  °· If the port is used for intermittent medicines or blood draws, the port will need to be flushed:   °¨ After medicines have been given.   °¨ After blood has been drawn.   °¨ As part of routine maintenance.   °· If a constant infusion is running, the port may not need to be flushed.   °HOW LONG WILL MY PORT STAY IMPLANTED? °The port can stay in for as long as your health care   provider thinks it is needed. When it is time for the port to come out, surgery will be done to remove it. The procedure is similar to the one performed when the port was put in.  °WHEN SHOULD I SEEK IMMEDIATE MEDICAL CARE? °When you have an implanted port, you should seek immediate medical care if:  °· You notice a bad smell coming from the incision site.   °· You have swelling, redness, or drainage at the incision site.   °· You have more swelling or pain at the port site or the surrounding area.   °· You have a fever that is not controlled with medicine. °This information is not intended to replace advice given to you by your health care provider. Make sure you discuss any questions you have with  your health care provider. °Document Released: 02/19/2005 Document Revised: 12/10/2012 Document Reviewed: 10/27/2012 °Elsevier Interactive Patient Education © 2017 [employer] °Implanted Port Insertion, Care After °Refer to this sheet in the next few weeks. These instructions provide you with information on caring for yourself after your procedure. Your health care provider may also give you more specific instructions. Your treatment has been planned according to current medical practices, but problems sometimes occur. Call your health care provider if you have any problems or questions after your procedure. °WHAT TO EXPECT AFTER THE PROCEDURE °After your procedure, it is typical to have the following:  °· Discomfort at the port insertion site. Ice packs to the area will help. °· Bruising on the skin over the port. This will subside in 3-4 days. °HOME CARE INSTRUCTIONS °· After your port is placed, you will get a manufacturer's information card. The card has information about your port. Keep this card with you at all times.   °· Know what kind of port you have. There are many types of ports available.   °· Wear a medical alert bracelet in case of an emergency. This can help alert health care workers that you have a port.   °· The port can stay in for as long as your health care provider believes it is necessary.   °· A home health care nurse may give medicines and take care of the port.   °· You or a family member can get special training and directions for giving medicine and taking care of the port at home.   °SEEK MEDICAL CARE IF:  °· Your port does not flush or you are unable to get a blood return.   °· You have a fever or chills. °SEEK IMMEDIATE MEDICAL CARE IF: °· You have new fluid or pus coming from your incision.   °· You notice a bad smell coming from your incision site.   °· You have swelling, pain, or more redness at the incision or port site.   °· You have chest pain or shortness of breath. °This  information is not intended to replace advice given to you by your health care provider. Make sure you discuss any questions you have with your health care provider. °Document Released: 12/10/2012 Document Revised: 02/24/2013 Document Reviewed: 12/10/2012 °Elsevier Interactive Patient Education © 2017 [employer] °Moderate Conscious Sedation, Adult, Care After °These instructions provide you with information about caring for yourself after your procedure. Your health care provider may also give you more specific instructions. Your treatment has been planned according to current medical practices, but problems sometimes occur. Call your health care provider if you have any problems or questions after your procedure. °What can I expect after the procedure? °After your procedure, it is common: °·   To feel sleepy for several hours. °· To feel clumsy and have poor balance for several hours. °· To have poor judgment for several hours. °· To vomit if you eat too soon. °Follow these instructions at home: °For at least 24 hours after the procedure:  °· Do not: °¨ Participate in activities where you could fall or become injured. °¨ Drive. °¨ Use heavy machinery. °¨ Drink alcohol. °¨ Take sleeping pills or medicines that cause drowsiness. °¨ Make important decisions or sign legal documents. °¨ Take care of children on your own. °· Rest. °Eating and drinking °· Follow the diet recommended by your health care provider. °· If you vomit: °¨ Drink water, juice, or soup when you can drink without vomiting. °¨ Make sure you have little or no nausea before eating solid foods. °General instructions °· Have a responsible adult stay with you until you are awake and alert. °· Take over-the-counter and prescription medicines only as told by your health care provider. °· If you smoke, do not smoke without supervision. °· Keep all follow-up visits as told by your health care provider. This is important. °Contact a health care provider  if: °· You keep feeling nauseous or you keep vomiting. °· You feel light-headed. °· You develop a rash. °· You have a fever. °Get help right away if: °· You have trouble breathing. °This information is not intended to replace advice given to you by your health care provider. Make sure you discuss any questions you have with your health care provider. °Document Released: 12/10/2012 Document Revised: 07/25/2015 Document Reviewed: 06/11/2015 °Elsevier Interactive Patient Education © 2017 Else[employer] Inc. ° °

## 2016-02-24 NOTE — Procedures (Signed)
Esophageal cancer stage 4  S/p RT IJ POWER PORT Tip svcra No comp Ready for use Full report in PACS

## 2016-02-24 NOTE — H&P (Signed)
Chief Complaint: esophageal cancer  Referring Physician:Dr. Truitt Merle  Supervising Physician: Daryll Brod  Patient Status: [facility] - Out-pt  HPI: Walter Craig is an 60 y.o. male who was recently diagnosed with metastatic esophageal cancer.  He is going to start chemotherapy hopefully in the first week of January.  A request has been made for a PAC to be placed to assist with access.  The patient denies any SOB, URI symptoms, urinary symptoms, abdominal pain etc.  He denies recent fevers at home.  He admits that his pulse has been up recently, but isn't sure when it went up as high.  He presents today for placement of this PAC.  Past Medical History:  Past Medical History:  Diagnosis Date  . Cancer ([place]) 02/2016   low esophagus cancer, mets   . Hypertension   . Sleep apnea     Past Surgical History:  Past Surgical History:  Procedure Laterality Date  . CHOLECYSTECTOMY    . left knee meniscus repare       Family History:  Family History  Problem Relation Age of Onset  . Cancer Father     colon cancer   . Cancer Sister     colon cancer   . Cancer Brother 47    gallbladder cancer     Social History:  reports that he quit smoking about 11 years ago. He has a 45.00 pack-year smoking history. He has never used smokeless tobacco. He reports that he drinks about 1.8 - 2.4 oz of alcohol per week . He reports that he uses drugs, including Marijuana.  Allergies: Not on File  Medications: Medications reviewed in epic  Please HPI for pertinent positives, otherwise complete 10 system ROS negative.  Mallampati Score: MD Evaluation Airway: WNL Heart: WNL Abdomen: WNL Chest/ Lungs: WNL ASA  Classification: 3 Mallampati/Airway Score: Two  Physical Exam: BP (!) 156/101 (BP Location: Right Arm)   Pulse (!) 128   Temp (!) 100.6 F (38.1 C) (Oral)   Resp 18   SpO2 100%  There is no height or weight on file to calculate BMI. General: pleasant, WD, WN white male who is laying  in bed in NAD HEENT: head is normocephalic, atraumatic.  Sclera are noninjected.  PERRL.  Ears and nose without any masses or lesions.  Mouth is pink and moist Heart: regular rhythm, but tachycardic.  Normal s1,s2. No obvious murmurs, gallops, or rubs noted.  Palpable radial and pedal pulses bilaterally Lungs: CTAB, no wheezes, rhonchi, or rales noted.  Respiratory effort nonlabored Abd: soft, NT, ND, +BS, no masses, hernias, or organomegaly MS: all 4 extremities are symmetrical with no cyanosis, clubbing, or edema. Skin: warm and dry with no masses, lesions, or rashes Psych: A&Ox3 with an appropriate affect.   Labs: Results for orders placed or performed during the hospital encounter of 02/24/16 (from the past 48 hour(s))  Basic metabolic panel     Status: Abnormal   Collection Time: 02/24/16 12:07 PM  Result Value Ref Range   Sodium 133 (L) 135 - 145 mmol/L   Potassium 3.2 (L) 3.5 - 5.1 mmol/L   Chloride 95 (L) 101 - 111 mmol/L   CO2 25 22 - 32 mmol/L   Glucose, Bld 128 (H) 65 - 99 mg/dL   BUN 24 (H) 6 - 20 mg/dL   Creatinine, Ser 1.18 0.61 - 1.24 mg/dL   Calcium 9.7 8.9 - 10.3 mg/dL   GFR calc non Af Amer >60 >60 mL/min  GFR calc Af Amer >60 >60 mL/min    Comment: (NOTE) The eGFR has been calculated using the CKD EPI equation. This calculation has not been validated in all clinical situations. eGFR's persistently <60 mL/min signify possible Chronic Kidney Disease.    Anion gap 13 5 - 15  CBC with Differential/Platelet     Status: None   Collection Time: 02/24/16 12:07 PM  Result Value Ref Range   WBC 8.6 4.0 - 10.5 K/uL   RBC 4.57 4.22 - 5.81 MIL/uL   Hemoglobin 14.4 13.0 - 17.0 g/dL   HCT 40.3 39.0 - 52.0 %   MCV 88.2 78.0 - 100.0 fL   MCH 31.5 26.0 - 34.0 pg   MCHC 35.7 30.0 - 36.0 g/dL   RDW 12.4 11.5 - 15.5 %   Platelets 229 150 - 400 K/uL   Neutrophils Relative % 86 %   Neutro Abs 7.4 1.7 - 7.7 K/uL   Lymphocytes Relative 8 %   Lymphs Abs 0.7 0.7 - 4.0 K/uL    Monocytes Relative 5 %   Monocytes Absolute 0.4 0.1 - 1.0 K/uL   Eosinophils Relative 1 %   Eosinophils Absolute 0.0 0.0 - 0.7 K/uL   Basophils Relative 0 %   Basophils Absolute 0.0 0.0 - 0.1 K/uL  Protime-INR     Status: None   Collection Time: 02/24/16 12:07 PM  Result Value Ref Range   Prothrombin Time 14.3 11.4 - 15.2 seconds   INR 1.11     Imaging: No results found.  Assessment/Plan 1. 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 -his labs and vitals reviewed.  His admitting temp was 100.6, but has come down to 99.5.  His WBC has normalized from 3 days ago from 12 to 8.6 today.  This was reviewed with Dr. Annamaria Boots and he is agreeable to proceed -Risks and Benefits discussed with the patient including, but not limited to bleeding, infection, pneumothorax, or fibrin sheath development and need for additional procedures. All of the patient's questions were answered, patient is agreeable to proceed. Consent signed and in chart.  Thank you for this interesting consult.  I greatly enjoyed meeting [name] and look forward to participating in their care.  A copy of this report was sent to the requesting provider on this date.  Electronically Signed: Henreitta Cea 02/24/2016, 12:46 PM   I spent a total of  30 Minutes   in face to face in clinical consultation, greater than 50% of which was counseling/coordinating care for esophageal cancer

## 2016-02-28 ENCOUNTER — Telehealth: Payer: Self-pay | Admitting: Hematology

## 2016-02-28 ENCOUNTER — Encounter: Payer: Self-pay | Admitting: *Deleted

## 2016-02-28 ENCOUNTER — Encounter: Payer: Self-pay | Admitting: Hematology

## 2016-02-28 ENCOUNTER — Other Ambulatory Visit: Payer: BLUE CROSS/BLUE SHIELD

## 2016-02-28 ENCOUNTER — Ambulatory Visit (HOSPITAL_BASED_OUTPATIENT_CLINIC_OR_DEPARTMENT_OTHER): Payer: BLUE CROSS/BLUE SHIELD | Admitting: Hematology

## 2016-02-28 VITALS — BP 147/95 | HR 135 | Resp 18 | Ht 70.0 in | Wt 219.5 lb

## 2016-02-28 DIAGNOSIS — C7951 Secondary malignant neoplasm of bone: Secondary | ICD-10-CM

## 2016-02-28 DIAGNOSIS — R634 Abnormal weight loss: Secondary | ICD-10-CM

## 2016-02-28 DIAGNOSIS — G893 Neoplasm related pain (acute) (chronic): Secondary | ICD-10-CM

## 2016-02-28 DIAGNOSIS — R1310 Dysphagia, unspecified: Secondary | ICD-10-CM

## 2016-02-28 DIAGNOSIS — C155 Malignant neoplasm of lower third of esophagus: Secondary | ICD-10-CM | POA: Diagnosis not present

## 2016-02-28 DIAGNOSIS — C159 Malignant neoplasm of esophagus, unspecified: Secondary | ICD-10-CM

## 2016-02-28 MED ORDER — ONDANSETRON HCL 8 MG PO TABS: 8.0000 mg | ORAL_TABLET | Freq: Two times a day (BID) | ORAL | 1 refills | Status: DC | PRN

## 2016-02-28 MED ORDER — LIDOCAINE-PRILOCAINE 2.5-2.5 % EX CREA: TOPICAL_CREAM | CUTANEOUS | 3 refills | Status: DC

## 2016-02-28 MED ORDER — CYCLOBENZAPRINE HCL 5 MG PO TABS: 5.0000 mg | ORAL_TABLET | Freq: Three times a day (TID) | ORAL | 0 refills | Status: DC | PRN

## 2016-02-28 MED ORDER — PROCHLORPERAZINE MALEATE 10 MG PO TABS: 10.0000 mg | ORAL_TABLET | Freq: Four times a day (QID) | ORAL | 1 refills | Status: DC | PRN

## 2016-02-28 MED ORDER — MORPHINE SULFATE ER 15 MG PO TBCR: 15.0000 mg | EXTENDED_RELEASE_TABLET | Freq: Three times a day (TID) | ORAL | 0 refills | Status: DC

## 2016-02-28 MED FILL — MORPHINE SULF ER 15 MG TAB: 15 | 30 days supply | Qty: 90 | Fill #0

## 2016-02-28 MED FILL — CYCLOBENZAPRINE 5 MG TABLET: 5 | 5 days supply | Qty: 30 | Fill #0

## 2016-02-28 NOTE — Telephone Encounter (Signed)
Per Dr Burr Medico, patient is to start Chemo on 03/01/16, per 03/02/16 los. Date is capped. This is also the patient's 1st time for treatment and will need prior-authorization. A message was sent to [place], Cc: Thompson Caul & myself. Will await response from [place] regarding start date of 03/01/16. Patient was advised that he will receive a call regarding this appointment. 02/28/16

## 2016-02-28 NOTE — Progress Notes (Signed)
[place]  [phone] Fax:([phone]) [phone]  Clinic follow Up Note   Patient Care Team: Hulan Fess, MD as PCP - General (Family Medicine) Wonda Horner, MD as Consulting Physician (Gastroenterology) Truitt Merle, MD as Consulting Physician (Hematology) 02/28/2016     Esophageal cancer, stage IV ([place])   02/09/2016 Imaging    CT chest with contrast showed constellation of abnormal posterior mediastinum and lytic osseous metastatic disease most compatible with stage IV esophageal cancer, distal thoracic esophagus masslike soft tissue thickening and irregularity up to 2 cm. Multiple destructive rib metastasis with pathological fractures. Multiple thoracic and upper lumbar vertebral metastasis, without suspected epidural extension.      02/15/2016 Procedure     esophageal mucosal change consistent with long segment Barrett's esophagus, a large malignant mass was found in the distal esophagus, partially obstructing and 3/4 circumferential.      02/15/2016 Initial Biopsy    Distal esophageal mass biopsy showed adenocarcinoma, arising in the background of intestinal metaplasia associate with high-grade granular dysplasia. Positive for lymphovascular invasion.      02/15/2016 Initial Diagnosis    Esophageal cancer, stage IV ([facility])       CHIEF COMPLAINTS:  Follow up metastatic esophageal cancer   HISTORY OF PRESENTING ILLNESS (02/21/2016):  Walter Craig 60 y.o. male is here because of His recently diagnosed metastatic esophageal cancer. He is accompanied by his sister-in-law to my clinic today. His daughter was on the phone during the clinic visit also.  He presented with bilateral chest and back pain in early 2023/02/04, after his brother's death. He was seen by his PCP. He had CXR which was negative and he took NSAIDs for the pain. He was referred to PT, bu this pain did not improve. A CT scan was subsequently obtained, which showed distal surgery. Thoracic esophagus  masslike soft tissue thickening and irregularity up to his 2 cm, multiple mediastinal and osseous metastasis compatible with stage IV esophageal cancer. He was referred to S retinologist Dr. Penelope Coop, underwent EGD on 02/15/2016 which showed esophageal mucosal change consistent with long segment Barrett's esophagus, a large malignant mass was found in the distal esophagus, partially obstructing and 3/4 circumferential. Biopsies showed invasive adenocarcinoma.  He denies any dysphagia, he recently developed mid chest pain when he eats lately, better with soft and liquid diet. Pain 9/10, he takes hydrocordone 2 tab a day, not well controlled. He still works, he is a Chiropractor, desk job   He had not had BM for 8 days, probably secondary to his pain medication.  He has good appetite, but eats less due to dysphagea, he lost about 20 lbs in the past few months   He has three chidren, his daughter lives in [place], 2 sons living in [place]. His brother Inocente Salles recently died from clinical carcinoma, and [name] is currently receiving Keytruda for her cholangiocarcinoma.  CURRENT THERAPY: pending first line chemo FOLFOX   INTERIM HISTORY: [name] returns for follow up. He has been using morphine 10 mg every 4 hours as needed, 4-5 times a day. He is tolerating well, mild drowsiness, and his pain overall has improved. He still has quite significant pain when he changes position, no significant pain when he lays down. He feels his back muscle is very tight. He has been able to tolerate soft and liquid site, he drinks ensure 2 bottles a day. He has lost about 9 pounds in the past week. He did meet our dietician.   MEDICAL  HISTORY:  Past Medical History:  Diagnosis Date  . Cancer ([place]) 02/2016   low esophagus cancer, mets   . Hypertension   . Sleep apnea     SURGICAL HISTORY: Past Surgical History:  Procedure Laterality Date  . CHOLECYSTECTOMY    . IR GENERIC HISTORICAL  02/24/2016     IR US GUIDE VASC ACCESS RIGHT 02/24/2016 Greggory Keen, MD [facility]  . IR GENERIC HISTORICAL  02/24/2016   IR FLUORO GUIDE PORT INSERTION RIGHT 02/24/2016 Greggory Keen, MD [facility]  . left knee meniscus repare       SOCIAL HISTORY: Social History   Social History  . Marital status: Married    Spouse name: N/A  . Number of children: N/A  . Years of education: N/A   Occupational History  . Not on file.   Social History Main Topics  . Smoking status: Former Smoker    Packs/day: 1.50    Years: 30.00    Quit date: 03/05/2004  . Smokeless tobacco: Never Used  . Alcohol use 1.8 - 2.4 oz/week    2 - 3 Cans of beer, 1 Shots of liquor per week     Comment: daily   . Drug use:     Types: Marijuana  . Sexual activity: Not on file   Other Topics Concern  . 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 Cancer Father     colon cancer   . Cancer Sister     colon cancer   . Cancer Brother 58    gallbladder cancer     ALLERGIES:  has No Known Allergies.  MEDICATIONS:  Current Outpatient Prescriptions  Medication Sig Dispense Refill  . ALPRAZolam (XANAX) 1 MG tablet Take 1 mg by mouth at bedtime as needed.    . diclofenac (VOLTAREN) 75 MG EC tablet Take 1 tablet by mouth 2 (two) times daily as needed.  1  . FLUTICASONE FUROATE-VILANTEROL IN Inhale into the lungs.    . hydrochlorothiazide (HYDRODIURIL) 25 MG tablet Take 25 mg by mouth daily.  1  . HYDROcodone-acetaminophen (NORCO) 10-325 MG tablet Take 1 tablet by mouth 2 (two) times daily as needed.  0  . metoprolol succinate (TOPROL-XL) 25 MG 24 hr tablet Take 1 tablet by mouth daily.  1  . Morphine Sulfate (MORPHINE CONCENTRATE) 10 MG/0.5ML SOLN concentrated solution Take 0.25 mLs (5 mg total) by mouth every 4 (four) hours as needed. 30 mL 0  . pravastatin  (PRAVACHOL) 80 MG tablet Take 80 mg by mouth daily.    Marland Kitchen zolpidem (AMBIEN) 10 MG tablet Take 10 mg by mouth at bedtime as needed.  0  . cyclobenzaprine (FLEXERIL) 5 MG tablet Take 1-2 tablets (5-10 mg total) by mouth 3 (three) times daily as needed for muscle spasms. 30 tablet 0  . lidocaine-prilocaine (EMLA) cream Apply to affected area once 30 g 3  . morphine (MS CONTIN) 15 MG 12 hr tablet Take 1 tablet (15 mg total) by mouth every 8 (eight) hours. 90 tablet 0  . ondansetron (ZOFRAN) 8 MG tablet Take 1 tablet (8 mg total) by mouth 2 (two) times daily as needed for refractory nausea / vomiting. Start on day 3 after chemotherapy. 30 tablet 1  . prochlorperazine (COMPAZINE) 10 MG tablet Take 1 tablet (10  mg total) by mouth every 6 (six) hours as needed (Nausea or vomiting). 30 tablet 1   No current facility-administered medications for this visit.     REVIEW OF SYSTEMS:   Constitutional: Denies fevers, chills or abnormal night sweats Eyes: Denies blurriness of vision, double vision or watery eyes Ears, nose, mouth, throat, and face: Denies mucositis or sore throat Respiratory: Denies cough, dyspnea or wheezes Cardiovascular: Denies palpitation, chest discomfort or lower extremity swelling Gastrointestinal:  Denies nausea, heartburn or change in bowel habits Skin: Denies abnormal skin rashes Lymphatics: Denies new lymphadenopathy or easy bruising Neurological:Denies numbness, tingling or new weaknesses Behavioral/Psych: Mood is stable, no new changes  All other systems were reviewed with the patient and are negative.  PHYSICAL EXAMINATION: ECOG PERFORMANCE STATUS: 1 - Symptomatic but completely ambulatory  Vitals:   02/28/16 0853  BP: (!) 147/95  Pulse: (!) 135  Resp: 18   Filed Weights   02/28/16 0853  Weight: 219 lb 8 oz (99.6 kg)    GENERAL:alert, no distress and comfortable SKIN: skin color, texture, turgor are normal, no rashes or significant lesions EYES: normal,  conjunctiva are pink and non-injected, sclera clear OROPHARYNX:no exudate, no erythema and lips, buccal mucosa, and tongue normal  NECK: supple, thyroid normal size, non-tender, without nodularity LYMPH:  no palpable lymphadenopathy in the cervical, axillary or inguinal LUNGS: clear to auscultation and percussion with normal breathing effort HEART: regular rate & rhythm and no murmurs and no lower extremity edema ABDOMEN:abdomen soft, non-tender and normal bowel sounds Musculoskeletal:no cyanosis of digits and no clubbing  PSYCH: alert & oriented x 3 with fluent speech NEURO: no focal motor/sensory deficits  LABORATORY DATA:  I have reviewed the data as listed CBC Latest Ref Rng & Units 02/24/2016 02/21/2016  WBC 4.0 - 10.5 K/uL 8.6 12.0(H)  Hemoglobin 13.0 - 17.0 g/dL 14.4 15.8  Hematocrit 39.0 - 52.0 % 40.3 45.3  Platelets 150 - 400 K/uL 229 376     CMP Latest Ref Rng & Units 02/24/2016 02/21/2016  Glucose 65 - 99 mg/dL 128(H) 110  BUN 6 - 20 mg/dL 24(H) 21.3  Creatinine 0.61 - 1.24 mg/dL 1.18 1.1  Sodium 135 - 145 mmol/L 133(L) 139  Potassium 3.5 - 5.1 mmol/L 3.2(L) 4.0  Chloride 101 - 111 mmol/L 95(L) -  CO2 22 - 32 mmol/L 25 32(H)  Calcium 8.9 - 10.3 mg/dL 9.7 14.6(HH)  Total Protein 6.4 - 8.3 g/dL - 8.4(H)  Total Bilirubin 0.20 - 1.20 mg/dL - 0.60  Alkaline Phos 40 - 150 U/L - 123  AST 5 - 34 U/L - 47(H)  ALT 0 - 55 U/L - 55    PATHOLOGY REPORT  Diagnosis 02/15/2016 Adenocarcinoma present involving at least laminal propria and arising in background of intestinal metaplasia associated high-grade granular dysplasia. Positive for lymphovascular invasion.   RADIOGRAPHIC STUDIES: I have personally reviewed the radiological images as listed and agreed with the findings in the report. Ct Chest W Contrast  Addendum Date: 02/09/2016   ADDENDUM REPORT: 02/09/2016 15:42 ADDENDUM: Study discussed by telephone with Dr. Lennette Bihari LITTLE on 02/09/2016 At 1528 hours. Electronically  Signed   By: Genevie Ann M.D.   On: 02/09/2016 15:42   Result Date: 02/09/2016 CLINICAL DATA:  60 year old male with chest wall pain, she bilateral shoulder blade pain, low back pain. Symptoms for 3-4 weeks. Former smoker. Initial encounter. EXAM: CT CHEST WITH CONTRAST TECHNIQUE: Multidetector CT imaging of the chest was performed during intravenous contrast administration. CONTRAST:  55m ISOVUE-300 IOPAMIDOL (  ISOVUE-300) INJECTION 61% COMPARISON:  Chest radiographs 01/05/2016. FINDINGS: Cardiovascular: Calcified coronary artery atherosclerosis. Minimal calcified aortic atherosclerosis. No pericardial effusion. Mediastinum/Nodes: Bulky and eccentric soft tissue thickening in the lower third of the thoracic esophagus which appears irregular and masslike. See sagittal image 100. Soft tissue thickening up to 2 cm. Associated posterior and subcarinal mediastinal lymphadenopathy, with increased size and number of lymph nodes. Nodes individually up to 13-16 mm short axis. Superimposed mild AP window and moderate right peritracheal adenopathy (right peritracheal node up to 15 mm). Lungs/Pleura: Major airways are patent. Areas of curvilinear subpleural and perihilar lung scarring mostly on the left. Two small 3-4 mm indeterminate left lung nodules (series 5, image 78 and image 91). No pleural effusion. Small probable intrapleural lymph node at the confluence of the right major and minor fissures (series 5, image 79 and coronal image 90). Other subtle nodularity along the pleural fissures is indeterminate. Upper Abdomen: The gastroesophageal junction appears normal. Negative visible stomach and other bowel in the upper abdomen. Mild retrocrural lymphadenopathy. Surgically absent gallbladder. Negative visible liver, spleen, pancreas, adrenal glands and kidneys. Musculoskeletal: Osseous metastatic disease. Destructive 3 x 5 cm lesion of the posterior right fifth rib (series 2, image 48). Lytic lesion of the posterior right  seventh rib. Nearby mildly displaced right posterior lateral seventh rib fracture might be pathologic (series 5, image 77). 2.5 x 3.5 lytic metastasis anterior left third rib. Smaller lytic metastasis with pathologic fracture posterior left fifth rib (series 2, image 59). Mildly displaced probable pathologic fracture posterior lateral left third rib (image 43). Scattered vertebral metastases including multiple small lytic vertebral body metastases in the thoracic spine (e.g. T4 series 4, image 106). Larger lytic left T11 metastasis (series 2, image 125) without strong evidence of epidural extension. Similar lytic posterior element metastasis right L1 vertebra (series 2, image 154). IMPRESSION: 1. Constellation of abnormal posterior mediastinum and lytic osseous metastatic disease most compatible with stage IV esophageal carcinoma. 2. Distal third thoracic esophagus masslike soft tissue thickening and irregularity up to 2 cm. See sagittal image 100. Recommend EGD. 3. Multiple destructive rib metastases with pathologic fractures. Multiple thoracic and upper lumbar vertebral metastases, though none with suspected epidural extension at this time. Electronically Signed: By: Genevie Ann M.D. On: 02/09/2016 15:20   Ir US Guide Vasc Access Right  Result Date: 02/24/2016 CLINICAL DATA:  Metastatic esophageal cancer EXAM: RIGHT INTERNAL JUGULAR SINGLE LUMEN POWER PORT CATHETER INSERTION Date:  12/22/201712/22/2017 2:30 pm Radiologist:  M. Daryll Brod, MD Guidance:  Ultrasound and fluoroscopic MEDICATIONS: 2 g Ancef; The antibiotic was administered within an appropriate time interval prior to skin puncture. ANESTHESIA/SEDATION: Versed 4.0 mg IV; Fentanyl 100 mcg IV; Moderate Sedation Time:  27 minutes The patient was continuously monitored during the procedure by the interventional radiology nurse under my direct supervision. FLUOROSCOPY TIME:  36 seconds (16 mGy) COMPLICATIONS: None immediate. CONTRAST:  None. PROCEDURE:  Informed consent was obtained from the patient following explanation of the procedure, risks, benefits and alternatives. The patient understands, agrees and consents for the procedure. All questions were addressed. A time out was performed. Maximal barrier sterile technique utilized including caps, mask, sterile gowns, sterile gloves, large sterile drape, hand hygiene, and 2% chlorhexidine scrub. Under sterile conditions and local anesthesia, right internal jugular micropuncture venous access was performed. Access was performed with ultrasound. Images were obtained for documentation. A guide wire was inserted followed by a transitional dilator. This allowed insertion of a guide wire and catheter into the IVC. Measurements  were obtained from the SVC / RA junction back to the right IJ venotomy site. In the right infraclavicular chest, a subcutaneous pocket was created over the second anterior rib. This was done under sterile conditions and local anesthesia. 1% lidocaine with epinephrine was utilized for this. A 2.5 cm incision was made in the skin. Blunt dissection was performed to create a subcutaneous pocket over the right pectoralis major muscle. The pocket was flushed with saline vigorously. There was adequate hemostasis. The port catheter was assembled and checked for leakage. The port catheter was secured in the pocket with two retention sutures. The tubing was tunneled subcutaneously to the right venotomy site and inserted into the SVC/RA junction through a valved peel-away sheath. Position was confirmed with fluoroscopy. Images were obtained for documentation. The patient tolerated the procedure well. No immediate complications. Incisions were closed in a two layer fashion with 4 - 0 Vicryl suture. Dermabond was applied to the skin. The port catheter was accessed, blood was aspirated followed by saline and heparin flushes. Needle was removed. A dry sterile dressing was applied. IMPRESSION: Ultrasound and  fluoroscopically guided right internal jugular single lumen power port catheter insertion. Tip in the SVC/RA junction. Catheter ready for use. Electronically Signed   By: Jerilynn Mages.  Shick M.D.   On: 02/24/2016 14:34   Ir Fluoro Guide Port Insertion Right  Result Date: 02/24/2016 CLINICAL DATA:  Metastatic esophageal cancer EXAM: RIGHT INTERNAL JUGULAR SINGLE LUMEN POWER PORT CATHETER INSERTION Date:  12/22/201712/22/2017 2:30 pm Radiologist:  M. Daryll Brod, MD Guidance:  Ultrasound and fluoroscopic MEDICATIONS: 2 g Ancef; The antibiotic was administered within an appropriate time interval prior to skin puncture. ANESTHESIA/SEDATION: Versed 4.0 mg IV; Fentanyl 100 mcg IV; Moderate Sedation Time:  27 minutes The patient was continuously monitored during the procedure by the interventional radiology nurse under my direct supervision. FLUOROSCOPY TIME:  36 seconds (16 mGy) COMPLICATIONS: None immediate. CONTRAST:  None. PROCEDURE: Informed consent was obtained from the patient following explanation of the procedure, risks, benefits and alternatives. The patient understands, agrees and consents for the procedure. All questions were addressed. A time out was performed. Maximal barrier sterile technique utilized including caps, mask, sterile gowns, sterile gloves, large sterile drape, hand hygiene, and 2% chlorhexidine scrub. Under sterile conditions and local anesthesia, right internal jugular micropuncture venous access was performed. Access was performed with ultrasound. Images were obtained for documentation. A guide wire was inserted followed by a transitional dilator. This allowed insertion of a guide wire and catheter into the IVC. Measurements were obtained from the SVC / RA junction back to the right IJ venotomy site. In the right infraclavicular chest, a subcutaneous pocket was created over the second anterior rib. This was done under sterile conditions and local anesthesia. 1% lidocaine with epinephrine was  utilized for this. A 2.5 cm incision was made in the skin. Blunt dissection was performed to create a subcutaneous pocket over the right pectoralis major muscle. The pocket was flushed with saline vigorously. There was adequate hemostasis. The port catheter was assembled and checked for leakage. The port catheter was secured in the pocket with two retention sutures. The tubing was tunneled subcutaneously to the right venotomy site and inserted into the SVC/RA junction through a valved peel-away sheath. Position was confirmed with fluoroscopy. Images were obtained for documentation. The patient tolerated the procedure well. No immediate complications. Incisions were closed in a two layer fashion with 4 - 0 Vicryl suture. Dermabond was applied to the skin. The port catheter  was accessed, blood was aspirated followed by saline and heparin flushes. Needle was removed. A dry sterile dressing was applied. IMPRESSION: Ultrasound and fluoroscopically guided right internal jugular single lumen power port catheter insertion. Tip in the SVC/RA junction. Catheter ready for use. Electronically Signed   By: Judie Petit.  Shick M.D.   On: 02/24/2016 14:34    ASSESSMENT & PLAN:  60 y.o. Caucasian male, with past medical history of hypertension, presented with bilateral chest pain and dysphagia  1. 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 -I recommend a PET scan for staging but it was due now to by his insurance. He is scheduled to have a CT of abdomen and pelvis with contrast and a bone scan later this week. -I reviewed the natural history of metastatic esophageal  cancer, unfortunately this is very aggressive malignancy, and it is not curable at this stage. His overall prognosis is very poor. -We discussed the role of palliative radiation, for his dysphagia and pain from bone metastasis. -Due to the diffuse symptomatic metastasis, and he is still able to tolerate soft diet and adequate, I recommend him to start systemic chemotherapy as soon as possible. -We discussed the systemic therapy options, including chemotherapy, such as FOLFOX, carboplatin and Taxol, and immunotherapy Keytruda if his tumor expressed PD-L1, and biological agent such as Herceptin if his tumor expressed HER-2. -I have requested PD-L1 and HER2 IHC on his biopsy  -Given his adenocarcinoma histology, I recommend him to start the first-line chemotherapy with FOLFOX --Chemotherapy consent: Side effects including but does not not limited to, fatigue, nausea, vomiting, diarrhea, hair loss, neuropathy, fluid retention, renal and kidney dysfunction, neutropenic fever, needed for blood transfusion, bleeding, coronary artery spasm and heart attack were discussed with patient in great detail. He agrees to proceed. -He is scheduled to start FOLFOX this Thursday 12/28 -I called in EMLA cream, Compazine and Zofran  2. Hypercalcemia -His calcium was 14.6 on 12/19, likely secondary to his underlying malignancy -he received zometa on 12/20 -follow-up for his calcium level closely  3. Dysphagia, weight loss -I encouraged him to take nutrition supplement, such as ensure boost, at least 3 bottles daily  -follow up with dietician   4. Chest pain -Secondary to his bone metastasis -He is on morphine 10 mg every 4 hours as needed, tolerating well -I recommend him to start morphine extended release 15 mg every 8 hours for better pain control  -I also called in Flexeril 5-10 mg every 8 hours as needed  -Constipation and management strategies reviewed with patient again  5. Hypertension and his  tachycardia -Likely secondary to his pain -We'll monitor  Plan -First cycle chemotherapy FOLFOX on 12/28, no neulasta  -He is scheduled for CT abdomen and pelvis with contrast and a bone scan on 12/29 -he will start morphine extensively is 15 mg every 8 hours, and continue morphine 10 mg every 4-6 hours as needed -I plan to see him back in 2-3 weeks before cycle 2 chemo   All questions were answered. The patient knows to call the clinic with any problems, questions or concerns.  I spent 25 minutes counseling the patient face to face. The total time  spent in the appointment was 30 minutes and more than 50% was on counseling.     Truitt Merle, MD 02/28/2016 8:51 PM

## 2016-02-29 ENCOUNTER — Telehealth: Payer: Self-pay | Admitting: Hematology

## 2016-02-29 ENCOUNTER — Other Ambulatory Visit (HOSPITAL_BASED_OUTPATIENT_CLINIC_OR_DEPARTMENT_OTHER): Payer: BLUE CROSS/BLUE SHIELD

## 2016-02-29 ENCOUNTER — Encounter: Payer: BLUE CROSS/BLUE SHIELD | Admitting: Nutrition

## 2016-02-29 DIAGNOSIS — C159 Malignant neoplasm of esophagus, unspecified: Secondary | ICD-10-CM | POA: Diagnosis not present

## 2016-02-29 LAB — COMPREHENSIVE METABOLIC PANEL
ALBUMIN: 3.4 g/dL — AB (ref 3.5–5.0)
ALK PHOS: 123 U/L (ref 40–150)
ALT: 41 U/L (ref 0–55)
AST: 48 U/L — ABNORMAL HIGH (ref 5–34)
Anion Gap: 12 mEq/L — ABNORMAL HIGH (ref 3–11)
BUN: 22.6 mg/dL (ref 7.0–26.0)
CO2: 29 mEq/L (ref 22–29)
Calcium: 9.8 mg/dL (ref 8.4–10.4)
Chloride: 91 mEq/L — ABNORMAL LOW (ref 98–109)
Creatinine: 1.3 mg/dL (ref 0.7–1.3)
EGFR: 60 mL/min/{1.73_m2} — ABNORMAL LOW (ref >90)
GLUCOSE: 118 mg/dL (ref 70–140)
POTASSIUM: 3.2 meq/L — AB (ref 3.5–5.1)
SODIUM: 133 meq/L — AB (ref 136–145)
Total Bilirubin: 0.85 mg/dL (ref 0.20–1.20)
Total Protein: 7.9 g/dL (ref 6.4–8.3)

## 2016-02-29 LAB — CBC WITH DIFFERENTIAL/PLATELET
BASO%: 0.5 % (ref 0.0–2.0)
BASOS ABS: 0.1 10*3/uL (ref 0.0–0.1)
EOS ABS: 0.4 10*3/uL (ref 0.0–0.5)
EOS%: 2.3 % (ref 0.0–7.0)
HCT: 43.1 % (ref 38.4–49.9)
HEMOGLOBIN: 15.1 g/dL (ref 13.0–17.1)
LYMPH%: 5.4 % — ABNORMAL LOW (ref 14.0–49.0)
MCH: 31.1 pg (ref 27.2–33.4)
MCHC: 34.9 g/dL (ref 32.0–36.0)
MCV: 89.1 fL (ref 79.3–98.0)
MONO#: 1.2 10*3/uL — ABNORMAL HIGH (ref 0.1–0.9)
MONO%: 7.3 % (ref 0.0–14.0)
NEUT#: 13.6 10*3/uL — ABNORMAL HIGH (ref 1.5–6.5)
NEUT%: 84.5 % — AB (ref 39.0–75.0)
Platelets: 316 10*3/uL (ref 140–400)
RBC: 4.84 10*6/uL (ref 4.20–5.82)
RDW: 12.5 % (ref 11.0–14.6)
WBC: 16.1 10*3/uL — ABNORMAL HIGH (ref 4.0–10.3)
lymph#: 0.9 10*3/uL (ref 0.9–3.3)

## 2016-02-29 NOTE — Telephone Encounter (Signed)
Lab, flush, follow up and chemo scheduled for 03/14/16, per 02/28/16 los. Patient was given a copy of the AVS report and appointment schedule, per 02/28/16 los.

## 2016-03-01 ENCOUNTER — Other Ambulatory Visit: Payer: Self-pay | Admitting: Hematology

## 2016-03-01 ENCOUNTER — Ambulatory Visit (HOSPITAL_BASED_OUTPATIENT_CLINIC_OR_DEPARTMENT_OTHER): Payer: BLUE CROSS/BLUE SHIELD

## 2016-03-01 ENCOUNTER — Other Ambulatory Visit: Payer: Self-pay | Admitting: *Deleted

## 2016-03-01 ENCOUNTER — Encounter: Payer: Self-pay | Admitting: *Deleted

## 2016-03-01 VITALS — BP 112/80 | HR 106 | Temp 97.9°F | Resp 18

## 2016-03-01 DIAGNOSIS — C159 Malignant neoplasm of esophagus, unspecified: Secondary | ICD-10-CM

## 2016-03-01 DIAGNOSIS — Z5111 Encounter for antineoplastic chemotherapy: Secondary | ICD-10-CM

## 2016-03-01 DIAGNOSIS — Z452 Encounter for adjustment and management of vascular access device: Secondary | ICD-10-CM

## 2016-03-01 MED ORDER — PALONOSETRON HCL INJECTION 0.25 MG/5ML
INTRAVENOUS | Status: AC
  Filled 2016-03-01: qty 5

## 2016-03-01 MED ORDER — SODIUM CHLORIDE 0.9 % IV SOLN
2400.0000 mg/m2 | INTRAVENOUS | Status: DC
  Administered 2016-03-01: 5350 mg via INTRAVENOUS
  Filled 2016-03-01: qty 107

## 2016-03-01 MED ORDER — DEXAMETHASONE SODIUM PHOSPHATE 10 MG/ML IJ SOLN
INTRAMUSCULAR | Status: AC
  Filled 2016-03-01: qty 1

## 2016-03-01 MED ORDER — OXALIPLATIN CHEMO INJECTION 100 MG/20ML
89.0000 mg/m2 | Freq: Once | INTRAVENOUS | Status: AC
  Administered 2016-03-01: 200 mg via INTRAVENOUS
  Filled 2016-03-01: qty 40

## 2016-03-01 MED ORDER — LEUCOVORIN CALCIUM INJECTION 350 MG
400.0000 mg/m2 | Freq: Once | INTRAMUSCULAR | Status: AC
  Administered 2016-03-01: 888 mg via INTRAVENOUS
  Filled 2016-03-01: qty 44.4

## 2016-03-01 MED ORDER — DEXTROSE 5 % IV SOLN
Freq: Once | INTRAVENOUS | Status: AC
  Administered 2016-03-01: 10:00:00 via INTRAVENOUS

## 2016-03-01 MED ORDER — PALONOSETRON HCL INJECTION 0.25 MG/5ML
0.2500 mg | Freq: Once | INTRAVENOUS | Status: AC
  Administered 2016-03-01: 0.25 mg via INTRAVENOUS

## 2016-03-01 MED ORDER — MORPHINE SULFATE (CONCENTRATE) 10 MG/0.5ML PO SOLN: 5.0000 mg | ORAL | 0 refills | Status: AC | PRN

## 2016-03-01 MED ORDER — ALTEPLASE 2 MG IJ SOLR
2.0000 mg | Freq: Once | INTRAMUSCULAR | Status: AC | PRN
  Administered 2016-03-01: 2 mg
  Filled 2016-03-01: qty 2

## 2016-03-01 MED ORDER — DEXAMETHASONE SODIUM PHOSPHATE 10 MG/ML IJ SOLN
10.0000 mg | Freq: Once | INTRAMUSCULAR | Status: AC
  Administered 2016-03-01: 10 mg via INTRAVENOUS

## 2016-03-01 NOTE — Progress Notes (Signed)
Oncology Nurse Navigator Documentation  Oncology Nurse Navigator Flowsheets 03/01/2016  Navigator Location [facility]  Referral date to RadOnc/MedOnc -  Navigator Encounter Type Treatment  Telephone -  Abnormal Finding Date -  Confirmed Diagnosis Date 02/15/2016  Treatment Initiated Date 03/01/2016  Patient Visit Type MedOnc  Treatment Phase First Chemo Tx--FOLFOX #1  Barriers/Navigation Needs Education;Coordination of Care  Education Pain/ Symptom Management;Other--prep and times/locations of bone scan and CT scan tomorrow; discussed how to take Zofran and Compazine. Also reassured him that fibrin sheath on port cath is very common. Encouraged him to eat/drink constantly for nutrition and try to get in 30 minutes exercise/day--can break up into 10 minute blocks--just walk. Informed him of Saturday procedure for pump d/c  Interventions Education;Coordination of Care  Coordination of Care Other--LOS to scheduling for next chemo 03/14/16 and he requests early am appointments and on Wednesday; inquired if he needs refill on MS liquid and had APP refill for him. Explained in future to call 24 hour notice for narcotic refills and he needs to pick these up in person w/photo ID>  Education Method Verbal;Teach-back  Support Groups/Services -  Acuity -  Time Spent with Patient 30  Met with patient and his sister during initial chemotherapy treatment to provide support and assess for needs to promote continuity of care  Merceda Elks, RN, BSN GI Oncology [place]

## 2016-03-01 NOTE — Patient Instructions (Addendum)
[facility] Discharge Instructions for Patients Receiving Chemotherapy  Today you received the following chemotherapy agents 5-FU/Leucovorin/ Oxaliplatin.   To help prevent nausea and vomiting after your treatment, we encourage you to take your nausea medication as directed.    If you develop nausea and vomiting that is not controlled by your nausea medication, call the clinic.   BELOW ARE SYMPTOMS THAT SHOULD BE REPORTED IMMEDIATELY:  *FEVER GREATER THAN 100.5 F  *CHILLS WITH OR WITHOUT FEVER  NAUSEA AND VOMITING THAT IS NOT CONTROLLED WITH YOUR NAUSEA MEDICATION  *UNUSUAL SHORTNESS OF BREATH  *UNUSUAL BRUISING OR BLEEDING  TENDERNESS IN MOUTH AND THROAT WITH OR WITHOUT PRESENCE OF ULCERS  *URINARY PROBLEMS  *BOWEL PROBLEMS  UNUSUAL RASH Items with * indicate a potential emergency and should be followed up as soon as possible.  Feel free to call the clinic you have any questions or concerns. The clinic phone number is [phone].  Please show the [facility] at check-in to the Emergency Department and triage nurse.  Leucovorin injection What is this medicine? LEUCOVORIN (loo koe VOR in) is used to prevent or treat the harmful effects of some medicines. This medicine is used to treat anemia caused by a low amount of folic acid in the body. It is also used with 5-fluorouracil (5-FU) to treat colon cancer. This medicine may be used for other purposes; ask your health care provider or pharmacist if you have questions. What should I tell my health care provider before I take this medicine? They need to know if you have any of these conditions: -anemia from low levels of vitamin B-12 in the blood -an unusual or allergic reaction to leucovorin, folic acid, other medicines, foods, dyes, or preservatives -pregnant or trying to get pregnant -breast-feeding How should I use this medicine? This medicine is for injection into a muscle or into a vein. It is  given by a health care professional in a hospital or clinic setting. Talk to your pediatrician regarding the use of this medicine in children. Special care may be needed. Overdosage: If you think you have taken too much of this medicine contact a poison control center or emergency room at once. NOTE: This medicine is only for you. Do not share this medicine with others. What if I miss a dose? This does not apply. What may interact with this medicine? -capecitabine -fluorouracil -phenobarbital -phenytoin -primidone -trimethoprim-sulfamethoxazole This list may not describe all possible interactions. Give your health care provider a list of all the medicines, herbs, non-prescription drugs, or dietary supplements you use. Also tell them if you smoke, drink alcohol, or use illegal drugs. Some items may interact with your medicine. What should I watch for while using this medicine? Your condition will be monitored carefully while you are receiving this medicine. This medicine may increase the side effects of 5-fluorouracil, 5-FU. Tell your doctor or health care professional if you have diarrhea or mouth sores that do not get better or that get worse. What side effects may I notice from receiving this medicine? Side effects that you should report to your doctor or health care professional as soon as possible: -allergic reactions like skin rash, itching or hives, swelling of the face, lips, or tongue -breathing problems -fever, infection -mouth sores -unusual bleeding or bruising -unusually weak or tired Side effects that usually do not require medical attention (report to your doctor or health care professional if they continue or are bothersome): -constipation or diarrhea -loss of appetite -nausea,  vomiting This list may not describe all possible side effects. Call your doctor for medical advice about side effects. You may report side effects to FDA at 1-800-FDA-1088. Where should I keep my  medicine? This drug is given in a hospital or clinic and will not be stored at home. NOTE: This sheet is a summary. It may not cover all possible information. If you have questions about this medicine, talk to your doctor, pharmacist, or health care provider.  2017 Elsevier/Gold Standard (2007-08-26 16:50:29)   Fluorouracil, 5-FU injection What is this medicine? FLUOROURACIL, 5-FU (flure oh YOOR a sil) is a chemotherapy drug. It slows the growth of cancer cells. This medicine is used to treat many types of cancer like breast cancer, colon or rectal cancer, pancreatic cancer, and stomach cancer. This medicine may be used for other purposes; ask your health care provider or pharmacist if you have questions. COMMON BRAND NAME(S): Adrucil What should I tell my health care provider before I take this medicine? They need to know if you have any of these conditions: -blood disorders -dihydropyrimidine dehydrogenase (DPD) deficiency -infection (especially a virus infection such as chickenpox, cold sores, or herpes) -kidney disease -liver disease -malnourished, poor nutrition -recent or ongoing radiation therapy -an unusual or allergic reaction to fluorouracil, other chemotherapy, other medicines, foods, dyes, or preservatives -pregnant or trying to get pregnant -breast-feeding How should I use this medicine? This drug is given as an infusion or injection into a vein. It is administered in a hospital or clinic by a specially trained health care professional. Talk to your pediatrician regarding the use of this medicine in children. Special care may be needed. Overdosage: If you think you have taken too much of this medicine contact a poison control center or emergency room at once. NOTE: This medicine is only for you. Do not share this medicine with others. What if I miss a dose? It is important not to miss your dose. Call your doctor or health care professional if you are unable to keep an  appointment. What may interact with this medicine? -allopurinol -cimetidine -dapsone -digoxin -hydroxyurea -leucovorin -levamisole -medicines for seizures like ethotoin, fosphenytoin, phenytoin -medicines to increase blood counts like filgrastim, pegfilgrastim, sargramostim -medicines that treat or prevent blood clots like warfarin, enoxaparin, and dalteparin -methotrexate -metronidazole -pyrimethamine -some other chemotherapy drugs like busulfan, cisplatin, estramustine, vinblastine -trimethoprim -trimetrexate -vaccines Talk to your doctor or health care professional before taking any of these medicines: -acetaminophen -aspirin -ibuprofen -ketoprofen -naproxen This list may not describe all possible interactions. Give your health care provider a list of all the medicines, herbs, non-prescription drugs, or dietary supplements you use. Also tell them if you smoke, drink alcohol, or use illegal drugs. Some items may interact with your medicine. What should I watch for while using this medicine? Visit your doctor for checks on your progress. This drug may make you feel generally unwell. This is not uncommon, as chemotherapy can affect healthy cells as well as cancer cells. Report any side effects. Continue your course of treatment even though you feel ill unless your doctor tells you to stop. In some cases, you may be given additional medicines to help with side effects. Follow all directions for their use. Call your doctor or health care professional for advice if you get a fever, chills or sore throat, or other symptoms of a cold or flu. Do not treat yourself. This drug decreases your body's ability to fight infections. Try to avoid being around people who are sick.  This medicine may increase your risk to bruise or bleed. Call your doctor or health care professional if you notice any unusual bleeding. Be careful brushing and flossing your teeth or using a toothpick because you may get  an infection or bleed more easily. If you have any dental work done, tell your dentist you are receiving this medicine. Avoid taking products that contain aspirin, acetaminophen, ibuprofen, naproxen, or ketoprofen unless instructed by your doctor. These medicines may hide a fever. Do not become pregnant while taking this medicine. Women should inform their doctor if they wish to become pregnant or think they might be pregnant. There is a potential for serious side effects to an unborn child. Talk to your health care professional or pharmacist for more information. Do not breast-feed an infant while taking this medicine. Men should inform their doctor if they wish to father a child. This medicine may lower sperm counts. Do not treat diarrhea with over the counter products. Contact your doctor if you have diarrhea that lasts more than 2 days or if it is severe and watery. This medicine can make you more sensitive to the sun. Keep out of the sun. If you cannot avoid being in the sun, wear protective clothing and use sunscreen. Do not use sun lamps or tanning beds/booths. What side effects may I notice from receiving this medicine? Side effects that you should report to your doctor or health care professional as soon as possible: -allergic reactions like skin rash, itching or hives, swelling of the face, lips, or tongue -low blood counts - this medicine may decrease the number of white blood cells, red blood cells and platelets. You may be at increased risk for infections and bleeding. -signs of infection - fever or chills, cough, sore throat, pain or difficulty passing urine -signs of decreased platelets or bleeding - bruising, pinpoint red spots on the skin, black, tarry stools, blood in the urine -signs of decreased red blood cells - unusually weak or tired, fainting spells, lightheadedness -breathing problems -changes in vision -chest pain -mouth sores -nausea and vomiting -pain, swelling, redness  at site where injected -pain, tingling, numbness in the hands or feet -redness, swelling, or sores on hands or feet -stomach pain -unusual bleeding Side effects that usually do not require medical attention (report to your doctor or health care professional if they continue or are bothersome): -changes in finger or toe nails -diarrhea -dry or itchy skin -hair loss -headache -loss of appetite -sensitivity of eyes to the light -stomach upset -unusually teary eyes This list may not describe all possible side effects. Call your doctor for medical advice about side effects. You may report side effects to FDA at 1-800-FDA-1088. Where should I keep my medicine? This drug is given in a hospital or clinic and will not be stored at home. NOTE: This sheet is a summary. It may not cover all possible information. If you have questions about this medicine, talk to your doctor, pharmacist, or health care provider.  2017 Elsevier/Gold Standard (2007-06-25 13:53:16)  Oxaliplatin Injection What is this medicine? OXALIPLATIN (ox AL i PLA tin) is a chemotherapy drug. It targets fast dividing cells, like cancer cells, and causes these cells to die. This medicine is used to treat cancers of the colon and rectum, and many other cancers. This medicine may be used for other purposes; ask your health care provider or pharmacist if you have questions. COMMON BRAND NAME(S): Eloxatin What should I tell my health care provider before I take  this medicine? They need to know if you have any of these conditions: -kidney disease -an unusual or allergic reaction to oxaliplatin, other chemotherapy, other medicines, foods, dyes, or preservatives -pregnant or trying to get pregnant -breast-feeding How should I use this medicine? This drug is given as an infusion into a vein. It is administered in a hospital or clinic by a specially trained health care professional. Talk to your pediatrician regarding the use of this  medicine in children. Special care may be needed. Overdosage: If you think you have taken too much of this medicine contact a poison control center or emergency room at once. NOTE: This medicine is only for you. Do not share this medicine with others. What if I miss a dose? It is important not to miss a dose. Call your doctor or health care professional if you are unable to keep an appointment. What may interact with this medicine? -medicines to increase blood counts like filgrastim, pegfilgrastim, sargramostim -probenecid -some antibiotics like amikacin, gentamicin, neomycin, polymyxin B, streptomycin, tobramycin -zalcitabine Talk to your doctor or health care professional before taking any of these medicines: -acetaminophen -aspirin -ibuprofen -ketoprofen -naproxen This list may not describe all possible interactions. Give your health care provider a list of all the medicines, herbs, non-prescription drugs, or dietary supplements you use. Also tell them if you smoke, drink alcohol, or use illegal drugs. Some items may interact with your medicine. What should I watch for while using this medicine? Your condition will be monitored carefully while you are receiving this medicine. You will need important blood work done while you are taking this medicine. This medicine can make you more sensitive to cold. Do not drink cold drinks or use ice. Cover exposed skin before coming in contact with cold temperatures or cold objects. When out in cold weather wear warm clothing and cover your mouth and nose to warm the air that goes into your lungs. Tell your doctor if you get sensitive to the cold. This drug may make you feel generally unwell. This is not uncommon, as chemotherapy can affect healthy cells as well as cancer cells. Report any side effects. Continue your course of treatment even though you feel ill unless your doctor tells you to stop. In some cases, you may be given additional medicines to  help with side effects. Follow all directions for their use. Call your doctor or health care professional for advice if you get a fever, chills or sore throat, or other symptoms of a cold or flu. Do not treat yourself. This drug decreases your body's ability to fight infections. Try to avoid being around people who are sick. This medicine may increase your risk to bruise or bleed. Call your doctor or health care professional if you notice any unusual bleeding. Be careful brushing and flossing your teeth or using a toothpick because you may get an infection or bleed more easily. If you have any dental work done, tell your dentist you are receiving this medicine. Avoid taking products that contain aspirin, acetaminophen, ibuprofen, naproxen, or ketoprofen unless instructed by your doctor. These medicines may hide a fever. Do not become pregnant while taking this medicine. Women should inform their doctor if they wish to become pregnant or think they might be pregnant. There is a potential for serious side effects to an unborn child. Talk to your health care professional or pharmacist for more information. Do not breast-feed an infant while taking this medicine. Call your doctor or health care professional if you  get diarrhea. Do not treat yourself. What side effects may I notice from receiving this medicine? Side effects that you should report to your doctor or health care professional as soon as possible: -allergic reactions like skin rash, itching or hives, swelling of the face, lips, or tongue -low blood counts - This drug may decrease the number of white blood cells, red blood cells and platelets. You may be at increased risk for infections and bleeding. -signs of infection - fever or chills, cough, sore throat, pain or difficulty passing urine -signs of decreased platelets or bleeding - bruising, pinpoint red spots on the skin, black, tarry stools, nosebleeds -signs of decreased red blood cells -  unusually weak or tired, fainting spells, lightheadedness -breathing problems -chest pain, pressure -cough -diarrhea -jaw tightness -mouth sores -nausea and vomiting -pain, swelling, redness or irritation at the injection site -pain, tingling, numbness in the hands or feet -problems with balance, talking, walking -redness, blistering, peeling or loosening of the skin, including inside the mouth -trouble passing urine or change in the amount of urine Side effects that usually do not require medical attention (report to your doctor or health care professional if they continue or are bothersome): -changes in vision -constipation -hair loss -loss of appetite -metallic taste in the mouth or changes in taste -stomach pain This list may not describe all possible side effects. Call your doctor for medical advice about side effects. You may report side effects to FDA at 1-800-FDA-1088. Where should I keep my medicine? This drug is given in a hospital or clinic and will not be stored at home. NOTE: This sheet is a summary. It may not cover all possible information. If you have questions about this medicine, talk to your doctor, pharmacist, or health care provider.  2017 Elsevier/Gold Standard (2007-09-16 17:22:47)

## 2016-03-02 ENCOUNTER — Encounter (HOSPITAL_COMMUNITY): Payer: Self-pay

## 2016-03-02 ENCOUNTER — Other Ambulatory Visit: Payer: Self-pay

## 2016-03-02 ENCOUNTER — Other Ambulatory Visit (HOSPITAL_COMMUNITY): Payer: Self-pay

## 2016-03-02 ENCOUNTER — Ambulatory Visit (HOSPITAL_COMMUNITY): Admission: RE | Admit: 2016-03-02 | Payer: BLUE CROSS/BLUE SHIELD | Source: Ambulatory Visit

## 2016-03-02 ENCOUNTER — Telehealth: Payer: Self-pay | Admitting: Hematology & Oncology

## 2016-03-02 ENCOUNTER — Encounter (HOSPITAL_COMMUNITY)
Admission: RE | Admit: 2016-03-02 | Discharge: 2016-03-02 | Disposition: A | Discharge status: Home / Self Care | Payer: BLUE CROSS/BLUE SHIELD | Source: Ambulatory Visit | Attending: Hematology | Admitting: Hematology

## 2016-03-02 ENCOUNTER — Inpatient Hospital Stay (HOSPITAL_COMMUNITY)
Admission: EM | Admit: 2016-03-02 | Discharge: 2016-03-06 | DRG: 176 | Disposition: A | Discharge status: Home / Self Care | Payer: BLUE CROSS/BLUE SHIELD | Attending: Family Medicine | Admitting: Family Medicine

## 2016-03-02 ENCOUNTER — Ambulatory Visit (HOSPITAL_COMMUNITY)
Admission: RE | Admit: 2016-03-02 | Discharge: 2016-03-02 | Disposition: A | Discharge status: Home / Self Care | Payer: BLUE CROSS/BLUE SHIELD | Source: Ambulatory Visit | Attending: Hematology | Admitting: Hematology

## 2016-03-02 ENCOUNTER — Emergency Department (HOSPITAL_COMMUNITY): Payer: BLUE CROSS/BLUE SHIELD

## 2016-03-02 DIAGNOSIS — R918 Other nonspecific abnormal finding of lung field: Secondary | ICD-10-CM

## 2016-03-02 DIAGNOSIS — E785 Hyperlipidemia, unspecified: Secondary | ICD-10-CM | POA: Diagnosis present

## 2016-03-02 DIAGNOSIS — I10 Essential (primary) hypertension: Secondary | ICD-10-CM | POA: Diagnosis not present

## 2016-03-02 DIAGNOSIS — E876 Hypokalemia: Secondary | ICD-10-CM | POA: Diagnosis present

## 2016-03-02 DIAGNOSIS — C159 Malignant neoplasm of esophagus, unspecified: Secondary | ICD-10-CM

## 2016-03-02 DIAGNOSIS — C787 Secondary malignant neoplasm of liver and intrahepatic bile duct: Secondary | ICD-10-CM | POA: Insufficient documentation

## 2016-03-02 DIAGNOSIS — M799 Soft tissue disorder, unspecified: Secondary | ICD-10-CM | POA: Insufficient documentation

## 2016-03-02 DIAGNOSIS — I2699 Other pulmonary embolism without acute cor pulmonale: Secondary | ICD-10-CM | POA: Diagnosis not present

## 2016-03-02 DIAGNOSIS — F418 Other specified anxiety disorders: Secondary | ICD-10-CM | POA: Diagnosis not present

## 2016-03-02 DIAGNOSIS — Z800 Family history of malignant neoplasm of digestive organs: Secondary | ICD-10-CM

## 2016-03-02 DIAGNOSIS — F419 Anxiety disorder, unspecified: Secondary | ICD-10-CM | POA: Diagnosis present

## 2016-03-02 DIAGNOSIS — S46912A Strain of unspecified muscle, fascia and tendon at shoulder and upper arm level, left arm, initial encounter: Secondary | ICD-10-CM | POA: Diagnosis present

## 2016-03-02 DIAGNOSIS — C7951 Secondary malignant neoplasm of bone: Secondary | ICD-10-CM | POA: Diagnosis present

## 2016-03-02 DIAGNOSIS — G893 Neoplasm related pain (acute) (chronic): Secondary | ICD-10-CM | POA: Diagnosis present

## 2016-03-02 DIAGNOSIS — C799 Secondary malignant neoplasm of unspecified site: Secondary | ICD-10-CM

## 2016-03-02 DIAGNOSIS — R590 Localized enlarged lymph nodes: Secondary | ICD-10-CM

## 2016-03-02 DIAGNOSIS — Z7951 Long term (current) use of inhaled steroids: Secondary | ICD-10-CM

## 2016-03-02 DIAGNOSIS — Z87891 Personal history of nicotine dependence: Secondary | ICD-10-CM

## 2016-03-02 DIAGNOSIS — M899 Disorder of bone, unspecified: Secondary | ICD-10-CM

## 2016-03-02 DIAGNOSIS — G4733 Obstructive sleep apnea (adult) (pediatric): Secondary | ICD-10-CM | POA: Diagnosis present

## 2016-03-02 HISTORY — DX: Hyperlipidemia, unspecified: E78.5

## 2016-03-02 HISTORY — DX: Other specified anxiety disorders: F41.8

## 2016-03-02 LAB — I-STAT TROPONIN, ED: Troponin i, poc: 0.01 ng/mL (ref 0.00–0.08)

## 2016-03-02 LAB — PROTIME-INR
INR: 1.17
PROTHROMBIN TIME: 15 s (ref 11.4–15.2)

## 2016-03-02 LAB — CBC
HEMATOCRIT: 36.6 % — AB (ref 39.0–52.0)
HEMOGLOBIN: 12.9 g/dL — AB (ref 13.0–17.0)
MCH: 30.8 pg (ref 26.0–34.0)
MCHC: 35.2 g/dL (ref 30.0–36.0)
MCV: 87.4 fL (ref 78.0–100.0)
Platelets: 351 10*3/uL (ref 150–400)
RBC: 4.19 MIL/uL — AB (ref 4.22–5.81)
RDW: 12.3 % (ref 11.5–15.5)
WBC: 17.5 10*3/uL — AB (ref 4.0–10.5)

## 2016-03-02 LAB — BASIC METABOLIC PANEL
ANION GAP: 12 (ref 5–15)
BUN: 26 mg/dL — AB (ref 6–20)
CHLORIDE: 93 mmol/L — AB (ref 101–111)
CO2: 28 mmol/L (ref 22–32)
Calcium: 8.9 mg/dL (ref 8.9–10.3)
Creatinine, Ser: 1.1 mg/dL (ref 0.61–1.24)
GFR calc Af Amer: >60 mL/min (ref >60)
GFR calc non Af Amer: >60 mL/min (ref >60)
Glucose, Bld: 178 mg/dL — ABNORMAL HIGH (ref 65–99)
POTASSIUM: 3.2 mmol/L — AB (ref 3.5–5.1)
SODIUM: 133 mmol/L — AB (ref 135–145)

## 2016-03-02 LAB — APTT: APTT: 29 s (ref 24–36)

## 2016-03-02 MED ORDER — IOPAMIDOL (ISOVUE-300) INJECTION 61%: 100.0000 mL | Freq: Once | INTRAVENOUS | Status: DC | PRN

## 2016-03-02 MED ORDER — HEPARIN (PORCINE) IN NACL 100-0.45 UNIT/ML-% IJ SOLN
1500.0000 [IU]/h | INTRAMUSCULAR | Status: DC
  Administered 2016-03-03 – 2016-03-04 (×3): 1500 [IU]/h via INTRAVENOUS
  Filled 2016-03-02 (×3): qty 250

## 2016-03-02 MED ORDER — ADULT MULTIVITAMIN W/MINERALS CH
1.0000 | ORAL_TABLET | Freq: Every day | ORAL | Status: DC
  Administered 2016-03-02 – 2016-03-06 (×4): 1 via ORAL
  Filled 2016-03-02 (×5): qty 1

## 2016-03-02 MED ORDER — PRAVASTATIN SODIUM 40 MG PO TABS
80.0000 mg | ORAL_TABLET | Freq: Every day | ORAL | Status: DC
  Administered 2016-03-02 – 2016-03-06 (×5): 80 mg via ORAL
  Filled 2016-03-02 (×5): qty 2

## 2016-03-02 MED ORDER — ONDANSETRON HCL 4 MG PO TABS
8.0000 mg | ORAL_TABLET | Freq: Two times a day (BID) | ORAL | Status: DC | PRN
Start: 2016-03-02 — End: 2016-03-06

## 2016-03-02 MED ORDER — POLYETHYLENE GLYCOL 3350 17 G PO PACK: 17.0000 g | PACK | Freq: Every day | ORAL | Status: DC | PRN

## 2016-03-02 MED ORDER — ACETAMINOPHEN 650 MG RE SUPP: 650.0000 mg | Freq: Four times a day (QID) | RECTAL | Status: DC | PRN

## 2016-03-02 MED ORDER — IOPAMIDOL (ISOVUE-370) INJECTION 76%
INTRAVENOUS | Status: AC
  Filled 2016-03-02: qty 100

## 2016-03-02 MED ORDER — THIAMINE HCL 100 MG/ML IJ SOLN: 100.0000 mg | Freq: Every day | INTRAMUSCULAR | Status: DC

## 2016-03-02 MED ORDER — LORAZEPAM 1 MG PO TABS: 1.0000 mg | ORAL_TABLET | Freq: Four times a day (QID) | ORAL | Status: AC | PRN

## 2016-03-02 MED ORDER — LIDOCAINE-PRILOCAINE 2.5-2.5 % EX CREA
1.0000 "application " | TOPICAL_CREAM | Freq: Once | CUTANEOUS | Status: DC | PRN
  Filled 2016-03-02: qty 5

## 2016-03-02 MED ORDER — FLUTICASONE PROPIONATE 50 MCG/ACT NA SUSP: 1.0000 | Freq: Every day | NASAL | Status: DC | PRN

## 2016-03-02 MED ORDER — CYCLOBENZAPRINE HCL 10 MG PO TABS
5.0000 mg | ORAL_TABLET | Freq: Three times a day (TID) | ORAL | Status: DC | PRN
  Administered 2016-03-03 – 2016-03-05 (×5): 10 mg via ORAL
  Filled 2016-03-02 (×5): qty 1

## 2016-03-02 MED ORDER — LORAZEPAM 2 MG/ML IJ SOLN
0.0000 mg | Freq: Two times a day (BID) | INTRAMUSCULAR | Status: DC
Start: 2016-03-04 — End: 2016-03-06

## 2016-03-02 MED ORDER — MORPHINE SULFATE (CONCENTRATE) 10 MG/0.5ML PO SOLN
5.0000 mg | ORAL | Status: DC | PRN
  Administered 2016-03-03 – 2016-03-06 (×3): 10 mg via ORAL
  Filled 2016-03-02 (×3): qty 0.5

## 2016-03-02 MED ORDER — VITAMIN B-1 100 MG PO TABS
100.0000 mg | ORAL_TABLET | Freq: Every day | ORAL | Status: DC
  Administered 2016-03-02 – 2016-03-06 (×5): 100 mg via ORAL
  Filled 2016-03-02 (×5): qty 1

## 2016-03-02 MED ORDER — LORAZEPAM 2 MG/ML IJ SOLN: 1.0000 mg | Freq: Four times a day (QID) | INTRAMUSCULAR | Status: AC | PRN

## 2016-03-02 MED ORDER — LORAZEPAM 2 MG/ML IJ SOLN
0.0000 mg | Freq: Four times a day (QID) | INTRAMUSCULAR | Status: AC
Start: 2016-03-02 — End: 2016-03-04
  Administered 2016-03-02: 2 mg via INTRAVENOUS
  Filled 2016-03-02: qty 1

## 2016-03-02 MED ORDER — ENOXAPARIN SODIUM 100 MG/ML ~~LOC~~ SOLN
100.0000 mg | Freq: Two times a day (BID) | SUBCUTANEOUS | Status: DC
  Administered 2016-03-02: 100 mg via SUBCUTANEOUS
  Filled 2016-03-02 (×2): qty 1

## 2016-03-02 MED ORDER — HYDRALAZINE HCL 20 MG/ML IJ SOLN: 5.0000 mg | INTRAMUSCULAR | Status: DC | PRN

## 2016-03-02 MED ORDER — DOCUSATE SODIUM 100 MG PO CAPS: 100.0000 mg | ORAL_CAPSULE | Freq: Two times a day (BID) | ORAL | Status: DC | PRN

## 2016-03-02 MED ORDER — ZOLPIDEM TARTRATE 5 MG PO TABS
10.0000 mg | ORAL_TABLET | Freq: Every day | ORAL | Status: DC
  Administered 2016-03-02 – 2016-03-05 (×4): 10 mg via ORAL
  Filled 2016-03-02 (×4): qty 2

## 2016-03-02 MED ORDER — TECHNETIUM TC 99M MEDRONATE IV KIT
25.0000 | PACK | Freq: Once | INTRAVENOUS | Status: AC | PRN
  Administered 2016-03-02: 25 via INTRAVENOUS

## 2016-03-02 MED ORDER — ALPRAZOLAM 0.5 MG PO TABS: 0.5000 mg | ORAL_TABLET | Freq: Every evening | ORAL | Status: DC | PRN

## 2016-03-02 MED ORDER — FOLIC ACID 1 MG PO TABS
1.0000 mg | ORAL_TABLET | Freq: Every day | ORAL | Status: DC
  Administered 2016-03-02 – 2016-03-06 (×5): 1 mg via ORAL
  Filled 2016-03-02 (×5): qty 1

## 2016-03-02 MED ORDER — ALBUTEROL SULFATE (2.5 MG/3ML) 0.083% IN NEBU: 2.5000 mg | INHALATION_SOLUTION | Freq: Four times a day (QID) | RESPIRATORY_TRACT | Status: DC | PRN

## 2016-03-02 MED ORDER — IOPAMIDOL (ISOVUE-370) INJECTION 76%
100.0000 mL | Freq: Once | INTRAVENOUS | Status: AC | PRN
  Administered 2016-03-02: 80 mL via INTRAVENOUS

## 2016-03-02 MED ORDER — MORPHINE SULFATE ER 15 MG PO TBCR
15.0000 mg | EXTENDED_RELEASE_TABLET | Freq: Three times a day (TID) | ORAL | Status: DC
  Administered 2016-03-03 – 2016-03-06 (×11): 15 mg via ORAL
  Filled 2016-03-02 (×11): qty 1

## 2016-03-02 MED ORDER — METOPROLOL SUCCINATE ER 25 MG PO TB24
25.0000 mg | ORAL_TABLET | Freq: Every day | ORAL | Status: DC
  Administered 2016-03-03 – 2016-03-06 (×4): 25 mg via ORAL
  Filled 2016-03-02 (×4): qty 1

## 2016-03-02 MED ORDER — IOPAMIDOL (ISOVUE-300) INJECTION 61%
INTRAVENOUS | Status: AC
  Filled 2016-03-02: qty 100

## 2016-03-02 MED ORDER — TECHNETIUM TC 99M MEDRONATE IV KIT: 25.0000 | PACK | Freq: Once | INTRAVENOUS | Status: DC | PRN

## 2016-03-02 MED ORDER — POTASSIUM CHLORIDE 20 MEQ/15ML (10%) PO SOLN
30.0000 meq | Freq: Once | ORAL | Status: AC
  Administered 2016-03-02: 30 meq via ORAL
  Filled 2016-03-02: qty 30

## 2016-03-02 MED ORDER — SODIUM CHLORIDE 0.9% FLUSH: 3.0000 mL | Freq: Two times a day (BID) | INTRAVENOUS | Status: DC

## 2016-03-02 MED ORDER — ACETAMINOPHEN 325 MG PO TABS: 650.0000 mg | ORAL_TABLET | Freq: Four times a day (QID) | ORAL | Status: DC | PRN

## 2016-03-02 MED ORDER — SODIUM CHLORIDE 0.9 % IV SOLN
INTRAVENOUS | Status: DC
  Administered 2016-03-02 – 2016-03-04 (×3): via INTRAVENOUS

## 2016-03-02 NOTE — ED Notes (Signed)
Patient transported to CT 

## 2016-03-02 NOTE — ED Notes (Signed)
Pt asked if he could take his regularly scheduled meds, Dr. Alvino Chapel agreed.  Pt notified

## 2016-03-02 NOTE — ED Triage Notes (Addendum)
Pt reports he had a CT scan of his abdomen today which revealed a PE. Pt has esophageal cancer and his oncologist set up the CT for him today. Pt denies CP/SOB.   Pt also reports left shoulder pain 'i heard a pop when I got up today." he also reports blurry vision.

## 2016-03-02 NOTE — Consult Note (Signed)
Name: Walter Craig MRN: [id number] DOB: 05/15/1955    ADMISSION DATE:  03/02/2016 CONSULTATION DATE:  03/02/16  REFERRING MD :  EDP  CHIEF COMPLAINT:  Back pain  BRIEF PATIENT DESCRIPTION:  68M with acute PE. PMH significant for stage IV esophageal cancer, HTN, HLD, anxiety, and OSA (unable to tolerate CPAP).   SIGNIFICANT EVENTS   STUDIES:  CTA chest:  Acute bilateral lobar through segmental right upper, lobar through subsegmental right lower and left upper lobar through segmental pulmonary emboli.   HISTORY OF PRESENT ILLNESS:  Walter Craig is a 68M with PMH as above who presented to the ED with complaints of back pain. He underwent CT abdomen for staging of his esophageal cancer and incidentally a PE was noted. This was followed up with CTA chest which revealed significant clot burden. He complains only of back pain, but denies shortness of breath, cough, hemoptysis, sputum, dizziness, chest pain, palpitations, fever / chills, nausea, vomiting, urinary symptoms.   He is currently resting comfortably, although he complains of left shoulder, left hip and back pain. He is on room air and saturating adequately. He does not feel short of breath. No cough or hemoptysis. No fever / chills. No history of blood clots. He does report that he has been much less active for the last 6 weeks or so 2/2 pain and generally not feeling well, but was still ambulating independently.   PAST MEDICAL HISTORY :   has a past medical history of Cancer ([place]) (02/2016); Depression with anxiety; HLD (hyperlipidemia); Hypertension; and Sleep apnea.  has a past surgical history that includes Cholecystectomy; left knee meniscus repare ; ir generic historical (02/24/2016); and ir generic historical (02/24/2016). Prior to Admission medications   Medication Sig Start Date End Date Taking? Authorizing Provider  ALPRAZolam Duanne Moron) 0.5 MG tablet Take 0.5 mg by mouth at bedtime as needed for sleep.   Yes Historical Provider,  MD  cyclobenzaprine (FLEXERIL) 5 MG tablet Take 1-2 tablets (5-10 mg total) by mouth 3 (three) times daily as needed for muscle spasms. 02/28/16  Yes Truitt Merle, MD  diclofenac (VOLTAREN) 75 MG EC tablet Take 75 mg by mouth 2 (two) times daily as needed (pain/inflammation).  02/01/16  Yes Historical Provider, MD  fluticasone (FLONASE) 50 MCG/ACT nasal spray Place 1 spray into both nostrils daily as needed (congestion).   Yes Historical Provider, MD  hydrochlorothiazide (HYDRODIURIL) 25 MG tablet Take 25 mg by mouth daily. 01/18/16  Yes Historical Provider, MD  lidocaine-prilocaine (EMLA) cream Apply to affected area once Patient taking differently: Apply 1 application topically once as needed (prior to port access).  02/28/16  Yes Truitt Merle, MD  metoprolol succinate (TOPROL-XL) 25 MG 24 hr tablet Take 25 mg by mouth daily.  01/23/16  Yes Historical Provider, MD  morphine (MS CONTIN) 15 MG 12 hr tablet Take 1 tablet (15 mg total) by mouth every 8 (eight) hours. 02/28/16  Yes Truitt Merle, MD  Morphine Sulfate (MORPHINE CONCENTRATE) 10 MG/0.5ML SOLN concentrated solution Take 0.25-0.5 mLs (5-10 mg total) by mouth every 4 (four) hours as needed for severe pain. 03/01/16  Yes Maryanna Shape, NP  ondansetron (ZOFRAN) 8 MG tablet Take 1 tablet (8 mg total) by mouth 2 (two) times daily as needed for refractory nausea / vomiting. Start on day 3 after chemotherapy. 02/28/16  Yes Truitt Merle, MD  pravastatin (PRAVACHOL) 80 MG tablet Take 80 mg by mouth daily.   Yes Historical Provider, MD  prochlorperazine (COMPAZINE) 10 MG tablet  Take 1 tablet (10 mg total) by mouth every 6 (six) hours as needed (Nausea or vomiting). 02/28/16  Yes Truitt Merle, MD  zolpidem (AMBIEN) 10 MG tablet Take 10 mg by mouth at bedtime.  01/20/16  Yes Historical Provider, MD   No Known Allergies  FAMILY HISTORY:  family history includes Cancer in his father and sister; Cancer (age of onset: 76) in his brother.   SOCIAL HISTORY:  reports that  he quit smoking about 12 years ago. He has a 45.00 pack-year smoking history. He has never used smokeless tobacco. He reports that he drinks about 1.8 - 2.4 oz of alcohol per week . He reports that he uses drugs, including Marijuana.  REVIEW OF SYSTEMS:   General: no weight change, no fever, no chills Cardiovascular: no chest pain, palpitations, orthopnea, or PND Respiratory: see HPI Gastrointestinal: no nausea, vomiting, diarrhea, or constipation Genitourinary: no pain with urination, no foul odor, no frequency, no urgency Musculoskeletal: + back pain, left shoulder pain, left hip pain; no joint pain or swelling, no muscle aches Skin: no rashes, sores, or ulcers Endocrine: no blood sugar problems, no thyroid problems Neurologic: no numbness, tingling, dizziness, or visual changes Hematologic: no prior history of bleeding, bruising, or clotting problems Psychiatric: no depression or anxiety, no suicidal ideation or intent  SUBJECTIVE:   VITAL SIGNS: Temp:  [98.4 F (36.9 C)] 98.4 F (36.9 C) (12/29 1736) Pulse Rate:  [87-102] 87 (12/29 2208) Resp:  [16-23] 17 (12/29 2208) BP: (112-134)/(76-88) 118/76 (12/29 2200) SpO2:  [88 %-96 %] 95 % (12/29 2208) Weight:  [100.5 kg (221 lb 8 oz)] 100.5 kg (221 lb 8 oz) (12/29 1731)  PHYSICAL EXAMINATION: General Well nourished, well developed, no apparent distress  HEENT No gross abnormalities. Oropharynx clear. Mallampati IV. Good dentition.  Pulmonary Clear to auscultation bilaterally with no wheezes, rales or ronchi. Good effort, symmetrical expansion.   Cardiovascular Normal rate, regular rhythm. S1, s2. No m/r/g. Distal pulses palpable.  Abdomen Soft, non-tender, non-distended, positive bowel sounds, no palpable organomegaly or masses. Normoresonant to percussion.  Musculoskeletal Grossly normal.   Lymphatics No cervical, supraclavicular or axillary adenopathy.   Neurologic Grossly intact. No focal deficits.   Skin/Integuement No rash, no  cyanosis, no clubbing. No edema.       Recent Labs Lab 02/29/16 0942 03/02/16 1900  NA 133* 133*  K 3.2* 3.2*  CL  --  93*  CO2 29 28  BUN 22.6 26*  CREATININE 1.3 1.10  GLUCOSE 118 178*    Recent Labs Lab 02/29/16 0942 03/02/16 1900  HGB 15.1 12.9*  HCT 43.1 36.6*  WBC 16.1* 17.5*  PLT 316 351   Ct Angio Chest Pe W And/or Wo Contrast  Addendum Date: 03/02/2016   ADDENDUM REPORT: 03/02/2016 20:59 ADDENDUM: There soft tissue densities outlined by contrast in the distal SVC and right atrium potentially representing thrombus as well. Electronically Signed   By: Ashley Royalty M.D.   On: 03/02/2016 20:59   Result Date: 03/02/2016 CLINICAL DATA:  Metastatic esophageal cancer with dyspnea. EXAM: CT ANGIOGRAPHY CHEST WITH CONTRAST TECHNIQUE: Multidetector CT imaging of the chest was performed using the standard protocol during bolus administration of intravenous contrast. Multiplanar CT image reconstructions and MIPs were obtained to evaluate the vascular anatomy. CONTRAST:  80 cc of Isovue 370 IV COMPARISON:  02/09/2016 chest CT FINDINGS: Cardiovascular: Acute bilateral lobar through segmental right upper, lobar through subsegmental right lower and left upper lobar through segmental pulmonary emboli are noted with right heart  strain (RV/ LV ratio = 1). Calcified coronary atherosclerosis with minimal calcified aortic atherosclerosis. Top normal size cardiac chambers without pericardial effusion. Mediastinum/Nodes: Masslike abnormality in the distal esophagus is again noted involving the lower third with single wall thickness up to 2.1 cm unchanged in appearance. Associated posterior and subcarinal mediastinal lymphadenopathy is again identified without significant change. Superimposed mild AP window adenopathy to 12 mm short axis, paraesophageal adenopathy to 14 mm short axis and cluster of subcarinal lymph nodes are again noted slightly more prominent in the subcarinal region with slight  increase in number of lymph nodes suggested. Stable left para-aortic lymphadenopathy. Lungs/Pleura: Patchy ground-glass opacities are noted the visualized lung possibly representing hypoventilatory change. There is atelectasis at the right lung base. Upper Abdomen: The GE junction appears unremarkable. Stomach is contracted to the extent visible. 12 mm indeterminate left hepatic hypodensity is suggested on current exam not apparent on previous. Musculoskeletal: Interval progression of osseous metastatic disease with interval increase in size of several lytic foci in particular the manubrium, right scapula, T2,T4, the T9 and T11 vertebral bodies. Suspicious for epidural involvement of the left at T11. Multiple osteolytic bony destruction of bilateral ribs soft tissue components. Review of the MIP images confirms the above findings. IMPRESSION: Positive for acute PE with CT evidence of right heart strain (RV/LV Ratio = 1) consistent with at least submassive (intermediate risk) PE. The presence of right heart strain has been associated with an increased risk of morbidity and mortality. Please activate Code PE by paging [phone]. Critical Value/emergent results were called by telephone at the time of interpretation on 03/02/2016 at 8:05 pm to Dr. Davonna Belling , who verbally acknowledged these results. Distal esophageal mass with progression of metastatic disease. Of note, suggestion of epidural involvement at T11 on the left. Electronically Signed: By: Ashley Royalty M.D. On: 03/02/2016 20:05   Nm Bone Scan Whole Body  Result Date: 03/02/2016 CLINICAL DATA:  History of esophageal cancer.  Back and chest pain. EXAM: NUCLEAR MEDICINE WHOLE BODY BONE SCAN TECHNIQUE: Whole body anterior and posterior images were obtained approximately 3 hours after intravenous injection of radiopharmaceutical. RADIOPHARMACEUTICALS:  26.0 mCi Technetium-1m MDP IV COMPARISON:  Abdomen and pelvis CT, 03/02/2016. Chest CT,  02/09/2016. FINDINGS: There multiple areas of abnormal radiotracer localization consistent with metastatic disease to bone, corresponding to lytic lesions noted on the prior CTs. Specifically, there are small foci of uptake in the calvarium. Abnormal uptake is seen in the sternum and involving multiple ribs as well as the right scapula. There is uptake from the spine most prominently from the T10. More mild appearing uptake is noted in the pelvis. There is uptake involving the right mid femur. There also areas of degenerative uptake involving the sternoclavicular joints knees and feet. Renal uptake is symmetric. IMPRESSION: Multiple areas of abnormal uptake consistent with metastatic disease to bone, corresponding to lytic bone lesions noted on the recent chest and abdomen and pelvis CTs. Electronically Signed   By: Lajean Manes M.D.   On: 03/02/2016 16:40   Ct Abdomen Pelvis W Contrast  Result Date: 03/02/2016 CLINICAL DATA:  Patient with esophageal carcinoma. EXAM: CT ABDOMEN AND PELVIS WITH CONTRAST TECHNIQUE: Multidetector CT imaging of the abdomen and pelvis was performed using the standard protocol following bolus administration of intravenous contrast. CONTRAST:  100 cc Isovue-300 COMPARISON:  Chest CT 02/09/2016. FINDINGS: Lower chest: There is masslike thickening of the distal esophagus (image 4; series 201. Enlarged periaortic lymph nodes about the distal descending thoracic aorta measuring  up to 1.4 cm (image 8; series 201). Suggestion of possible filling defect within a right lower lobe pulmonary artery (image 4; series 201). Additionally within the subpleural right lower lobe there is triangular-shaped ground-glass and consolidative opacity (image 15; series 205). Hepatobiliary: Multiple low-attenuation lesions are demonstrated throughout the liver with a reference lesion in the left hepatic lobe measuring 1.2 cm (image 9; series 201) and a reference lesion within the inferior right hepatic lobe  measuring 5.0 x 3.3 cm (image 35; series 201). Patient status post cholecystectomy. Pancreas: Within the pancreatic tail there is a 1.5 cm enhancing lesion (image 33; series 201). The remainder the pancreas is unremarkable. Spleen: Unremarkable Adrenals/Urinary Tract: Mild nodularity of the adrenal glands bilaterally. Kidneys enhance symmetrically with contrast. Urinary bladder is unremarkable. Too small to characterize subcentimeter partially exophytic lesion off the interpolar region of the right kidney (image 32; series 201). Stomach/Bowel: No evidence for bowel obstruction. Stool throughout the colon. No free fluid or free intraperitoneal air. Normal morphology of the stomach. Two adjacent prominent small bowel mesenteric lymph nodes measuring up to 10 mm (image 42; series 2 with 3), potentially reactive. Metastatic lesions not excluded. Vascular/Lymphatic: Normal caliber abdominal aorta. Peripheral calcified atherosclerotic plaque. Retroaortic left renal vein. Reproductive: Prostate is unremarkable. Other: Small bilateral fat containing inguinal hernias. Musculoskeletal: Multiple lytic lesions are demonstrated throughout the visualized ribs, thoracic spine, lumbar spine as well as the pelvis. There is a 4.5 x 2.2 cm lytic lesion with soft tissue mass involving the posterior aspect of the T11 vertebral body which appears to narrow the spinal canal (image 14; series 201). Reference 4.3 cm lytic lesion within the left ilium (image 64; series 201). IMPRESSION: Findings suggestive of embolus within the visualized right lower lobe pulmonary arteries. There is an adjacent triangular-shaped subpleural ground-glass and consolidative opacity within the right lower lobe which is favored represent associated pulmonary infarct. Consider dedicated evaluation with CTA chest. Masslike thickening of the distal esophagus compatible with primary esophageal carcinoma. Multiple enlarged para-aortic lymph nodes compatible with  metastatic disease. Additionally there are multiple metastatic lesions involving the liver and visualized skeleton. Note is made of a large lytic lesion with soft tissue mass involving the T11 vertebral body which appears to narrow the spinal canal at this level. Nonspecific enhancing soft tissue mass at the level of the distal pancreas which may represent enhancing primary pancreatic lesion versus splenule. Metastatic disease is considered less likely. Critical Value/emergent results were called by telephone at the time of interpretation on 03/02/2016 at 2:16 pm to Dr. Marin Olp, who verbally acknowledged these results. Electronically Signed   By: Lovey Newcomer M.D.   On: 03/02/2016 14:17   Dg Shoulder Left  Result Date: 03/02/2016 CLINICAL DATA:  Felt pop at left shoulder when getting up. Initial encounter. EXAM: LEFT SHOULDER - 2+ VIEW COMPARISON:  None. FINDINGS: There is no evidence of fracture or dislocation. The left humeral head is seated within the glenoid fossa. The acromioclavicular joint is unremarkable in appearance. No significant soft tissue abnormalities are seen. The visualized portions of the left lung are clear. IMPRESSION: No evidence of fracture or dislocation. Electronically Signed   By: Garald Balding M.D.   On: 03/02/2016 19:55    ASSESSMENT / PLAN:  Walter Craig is a 21M with bilateral PE. He has not had any evidence of hemodynamic compromise and is saturating well on 2L O2. Recommend obtaining TTE, BNP and trending troponins. He does not meet submassive criteria clinically, so would not pursue lytics (  1/2 dose or catheter directed) at this time. Would favor enoxaparin for anticoagulation in the setting of underlying malignancy, but choice of anticoagulant should be made in conjunction with his oncologist.    Systemic anticoagulation with LMWH or heparin  TTE  BNP  Trend troponin  LE duplex  Supplemental O2 as needed to maintain sats > 88%  Walk for desat prior to  discharge   Yisroel Ramming, MD Pulmonary and [place] Pager: [phone]  03/02/2016, 10:20 PM

## 2016-03-02 NOTE — Telephone Encounter (Signed)
I received a call from radiology about Walter Craig CAT scan. He probably had a CAT scan of the abdomen and pelvis. However, it looked like that on that CT scan, there was a blood clot in the lung with a pulmonary infarct.  I called Walter Craig. He is to have a bone scan. I think that it is okay for him to have the bone scan. He was not having any cough. There is no shortness of breath. He is having some chest discomfort because of osseous metastasis.  Given that he has metastatic esophageal cancer, he is at higher risk for thromboembolic disease.  I told him that once he has a bone scan, that he can go to the emergency room at [facility]. I spoke to the charge nurse. They will be able to work him up. I would think that he would need a dedicated CT angiogram of the chest to confirm this pulmonary embolism.  Given that he has an underlying malignancy, he may need to go onto therapeutic anticoagulation with Lovenox. I think this probably is still the treatment of choice for thromboembolic disease in malignancy.  He probably needs to be admitted for a couple of days so that everything can be arranged for him to have Lovenox as an outpatient.  Again, he sounds pretty good. He really does not have any symptoms. I don't think he described any kind of leg swelling. He probably would need a Doppler of his legs to see he has any thromboembolic disease in his extremities.  He understood what I was telling him. He will go to the emergency room at [facility] this afternoon.  Lattie Haw, MD

## 2016-03-02 NOTE — Progress Notes (Signed)
ANTICOAGULATION CONSULT NOTE - Initial Consult  Pharmacy Consult for heparin Indication: pulmonary embolus  No Known Allergies  Patient Measurements: Height: 5\' 10"  (177.8 cm) Weight: 221 lb 8 oz (100.5 kg) IBW/kg (Calculated) : 73 Heparin Dosing Weight: 94kg  Vital Signs: Temp: 98.4 F (36.9 C) (12/29 1736) Temp Source: Oral (12/29 1736) BP: 122/87 (12/29 2015) Pulse Rate: 96 (12/29 2015)  Labs:  Recent Labs  02/29/16 0942 02/29/16 0942 03/02/16 1832 03/02/16 1900  HGB 15.1  --   --  12.9*  HCT 43.1  --   --  36.6*  PLT 316  --   --  351  APTT  --   --  29  --   LABPROT  --   --  15.0  --   INR  --   --  1.17  --   CREATININE  --  1.3  --  1.10    Estimated Creatinine Clearance: 85.9 mL/min (by C-G formula based on SCr of 1.1 mg/dL).   Medical History: Past Medical History:  Diagnosis Date  . Cancer ([place]) 02/2016   low esophagus cancer, [facility]   . Depression with anxiety   . HLD (hyperlipidemia)   . Hypertension   . Sleep apnea     Medications:  Infusions:  . sodium chloride    . [START ON 03/03/2016] heparin      Assessment: 23 yom presented to the ED with confirmed PE in the setting of malignancy. Initially started on lovenox but now changing to IV heparin. Received a dose of lovenox tonight at ~8pm. Baseline labs WNL and he is not on anticoagulation PTA.   Goal of Therapy:  Heparin level 0.3-0.7 units/ml Monitor platelets by anticoagulation protocol: Yes   Plan:  Start IV heparin tomorrow at 0800 at 1500 units/hr Check an 8 hr heparin level Daily heparin level and CBC  Leilany Digeronimo, Rande Lawman 03/02/2016,9:24 PM

## 2016-03-02 NOTE — H&P (Addendum)
History and Physical    Walter Craig [id number] DOB: October 11, 1955 DOA: 03/02/2016  Referring MD/NP/PA:   PCP: [name], MD   Patient coming from:  The patient is coming from home.  At baseline, pt is independent for most of ADL.   Chief Complaint: back pain  HPI: Walter Craig is a 60 y.o. male with medical history significant of hypertension, hyperlipidemia, anxiety, OSA not on CPAP, alcohol drinking, newly diagnosed metastasized stage for esophageal cancer, who presents with back pain.  Patient states he has been having back pain recently. It involves both lower and upper back. It is constant, 8 out of 10 in severity, nonradiating. He also has intermittent bilateral shoulder blade pain. His PCP ordered CT abdomen/pelvis today, which showed pulmonary embolism. Pt denies cough, shortness of breath and chest pain. No fever, chills, nausea, vomiting, diarrhea, abdominal pain, symptoms of UTI or unilateral weakness.  ED Course: pt was found to have WBC 17.5, INR 1.7, negative troponin, potassium of 3.2, sodium 133, creatinine 1.10, temperature normal, tachycardia, oxygen saturation 96% on room air. CTA of chest is positive for acute PE with CT evidence of right heart strain. Pt is placed on tele bed for obs. PCCM, Dr. Oletta Darter was consulted.  Review of Systems:   General: no fevers, chills, no changes in body weight, has fatigue HEENT: no blurry vision, hearing changes or sore throat Respiratory: no dyspnea, coughing, wheezing CV: no chest pain, no palpitations GI: no nausea, vomiting, abdominal pain, diarrhea, constipation GU: no dysuria, burning on urination, increased urinary frequency, hematuria  Ext: no leg edema Neuro: no unilateral weakness, numbness, or tingling, no vision change or hearing loss Skin: no rash, no skin tear. MSK: has back pain Heme: No easy bruising.  Travel history: No recent long distant travel.  Allergy: No Known Allergies  Past Medical History:    Diagnosis Date  . Cancer ([place]) 02/2016   low esophagus cancer, mets   . Depression with anxiety   . HLD (hyperlipidemia)   . Hypertension   . Sleep apnea     Past Surgical History:  Procedure Laterality Date  . CHOLECYSTECTOMY    . IR GENERIC HISTORICAL  02/24/2016   IR US GUIDE VASC ACCESS RIGHT 02/24/2016 Greggory Keen, MD [facility]  . IR GENERIC HISTORICAL  02/24/2016   IR FLUORO GUIDE PORT INSERTION RIGHT 02/24/2016 Greggory Keen, MD [facility]  . left knee meniscus repare       Social History:  reports that he quit smoking about 12 years ago. He has a 45.00 pack-year smoking history. He has never used smokeless tobacco. He reports that he drinks about 1.8 - 2.4 oz of alcohol per week . He reports that he uses drugs, including Marijuana.  Family History:  Family History  Problem Relation Age of Onset  . Cancer Father     colon cancer   . Cancer Sister     colon cancer   . Cancer Brother 56    gallbladder cancer      Prior to Admission medications   Medication Sig Start Date End Date Taking? Authorizing Provider  ALPRAZolam Duanne Moron) 1 MG tablet Take 1 mg by mouth at bedtime as needed.    Historical Provider, MD  cyclobenzaprine (FLEXERIL) 5 MG tablet Take 1-2 tablets (5-10 mg total) by mouth 3 (three) times daily as needed for muscle spasms. 02/28/16   Truitt Merle, MD  diclofenac (VOLTAREN) 75 MG EC tablet Take 1 tablet by mouth 2 (two) times  daily as needed. 02/01/16   Historical Provider, MD  FLUTICASONE FUROATE-VILANTEROL IN Inhale into the lungs.    Historical Provider, MD  hydrochlorothiazide (HYDRODIURIL) 25 MG tablet Take 25 mg by mouth daily. 01/18/16   Historical Provider, MD  HYDROcodone-acetaminophen (NORCO) 10-325 MG tablet Take 1 tablet by mouth 2 (two) times daily as needed. 02/09/16   Historical Provider, MD  lidocaine-prilocaine (EMLA) cream Apply to affected area once 02/28/16   Truitt Merle, MD  metoprolol succinate (TOPROL-XL) 25 MG 24 hr tablet Take 1  tablet by mouth daily. 01/23/16   Historical Provider, MD  morphine (MS CONTIN) 15 MG 12 hr tablet Take 1 tablet (15 mg total) by mouth every 8 (eight) hours. 02/28/16   Truitt Merle, MD  Morphine Sulfate (MORPHINE CONCENTRATE) 10 MG/0.5ML SOLN concentrated solution Take 0.25-0.5 mLs (5-10 mg total) by mouth every 4 (four) hours as needed for severe pain. 03/01/16   Maryanna Shape, NP  ondansetron (ZOFRAN) 8 MG tablet Take 1 tablet (8 mg total) by mouth 2 (two) times daily as needed for refractory nausea / vomiting. Start on day 3 after chemotherapy. 02/28/16   Truitt Merle, MD  pravastatin (PRAVACHOL) 80 MG tablet Take 80 mg by mouth daily.    Historical Provider, MD  prochlorperazine (COMPAZINE) 10 MG tablet Take 1 tablet (10 mg total) by mouth every 6 (six) hours as needed (Nausea or vomiting). 02/28/16   Truitt Merle, MD  zolpidem (AMBIEN) 10 MG tablet Take 10 mg by mouth at bedtime as needed. 01/20/16   Historical Provider, MD    Physical Exam: Vitals:   03/02/16 2000 03/02/16 2015 03/02/16 2045 03/02/16 2115  BP: 127/83 122/87 119/84 134/81  Pulse: 97 96 95 96  Resp: 23 19 19 16   Temp:      TempSrc:      SpO2: 93% 94% 94% 94%  Weight:      Height:       General: Not in acute distress HEENT:       Eyes: PERRL, EOMI, no scleral icterus.       ENT: No discharge from the ears and nose, no pharynx injection, no tonsillar enlargement.        Neck: No JVD, no bruit, no mass felt. Heme: No neck lymph node enlargement. Cardiac: S1/S2, RRR, No murmurs, No gallops or rubs. Respiratory: No rales, wheezing, rhonchi or rubs. GI: Soft, nondistended, nontender, no rebound pain, no organomegaly, BS present. GU: No hematuria Ext: No pitting leg edema bilaterally. 2+DP/PT pulse bilaterally. Musculoskeletal: No joint deformities, No joint redness or warmth, no limitation of ROM in spin. Skin: No rashes.  Neuro: Alert, oriented X3, cranial nerves II-XII grossly intact, moves all extremities normally.    Psych: Patient is not psychotic, no suicidal or hemocidal ideation.  Labs on Admission: I have personally reviewed following labs and imaging studies  CBC:  Recent Labs Lab 02/29/16 0942 03/02/16 1900  WBC 16.1* 17.5*  NEUTROABS 13.6*  --   HGB 15.1 12.9*  HCT 43.1 36.6*  MCV 89.1 87.4  PLT 316 [id number]   Basic Metabolic Panel:  Recent Labs Lab 02/29/16 0942 03/02/16 1900  NA 133* 133*  K 3.2* 3.2*  CL  --  93*  CO2 29 28  GLUCOSE 118 178*  BUN 22.6 26*  CREATININE 1.3 1.10  CALCIUM 9.8 8.9   GFR: Estimated Creatinine Clearance: 85.9 mL/min (by C-G formula based on SCr of 1.1 mg/dL). Liver Function Tests:  Recent Labs Lab 02/29/16 0942  AST  48*  ALT 41  ALKPHOS 123  BILITOT 0.85  PROT 7.9  ALBUMIN 3.4*   No results for input(s): LIPASE, AMYLASE in the last 168 hours. No results for input(s): AMMONIA in the last 168 hours. Coagulation Profile:  Recent Labs Lab 03/02/16 1832  INR 1.17   Cardiac Enzymes: No results for input(s): CKTOTAL, CKMB, CKMBINDEX, TROPONINI in the last 168 hours. BNP (last 3 results) No results for input(s): PROBNP in the last 8760 hours. HbA1C: No results for input(s): HGBA1C in the last 72 hours. CBG: No results for input(s): GLUCAP in the last 168 hours. Lipid Profile: No results for input(s): CHOL, HDL, LDLCALC, TRIG, CHOLHDL, LDLDIRECT in the last 72 hours. Thyroid Function Tests: No results for input(s): TSH, T4TOTAL, FREET4, T3FREE, THYROIDAB in the last 72 hours. Anemia Panel: No results for input(s): VITAMINB12, FOLATE, FERRITIN, TIBC, IRON, RETICCTPCT in the last 72 hours. Urine analysis: No results found for: COLORURINE, APPEARANCEUR, LABSPEC, PHURINE, GLUCOSEU, HGBUR, BILIRUBINUR, KETONESUR, PROTEINUR, UROBILINOGEN, NITRITE, LEUKOCYTESUR Sepsis Labs: @LABRCNTIP (procalcitonin:4,lacticidven:4) )No results found for this or any previous visit (from the past 240 hour(s)).   Radiological Exams on Admission: Ct Angio  Chest Pe W And/or Wo Contrast  Addendum Date: 03/02/2016   ADDENDUM REPORT: 03/02/2016 20:59 ADDENDUM: There soft tissue densities outlined by contrast in the distal SVC and right atrium potentially representing thrombus as well. Electronically Signed   By: Ashley Royalty M.D.   On: 03/02/2016 20:59   Result Date: 03/02/2016 CLINICAL DATA:  Metastatic esophageal cancer with dyspnea. EXAM: CT ANGIOGRAPHY CHEST WITH CONTRAST TECHNIQUE: Multidetector CT imaging of the chest was performed using the standard protocol during bolus administration of intravenous contrast. Multiplanar CT image reconstructions and MIPs were obtained to evaluate the vascular anatomy. CONTRAST:  80 cc of Isovue 370 IV COMPARISON:  02/09/2016 chest CT FINDINGS: Cardiovascular: Acute bilateral lobar through segmental right upper, lobar through subsegmental right lower and left upper lobar through segmental pulmonary emboli are noted with right heart strain (RV/ LV ratio = 1). Calcified coronary atherosclerosis with minimal calcified aortic atherosclerosis. Top normal size cardiac chambers without pericardial effusion. Mediastinum/Nodes: Masslike abnormality in the distal esophagus is again noted involving the lower third with single wall thickness up to 2.1 cm unchanged in appearance. Associated posterior and subcarinal mediastinal lymphadenopathy is again identified without significant change. Superimposed mild AP window adenopathy to 12 mm short axis, paraesophageal adenopathy to 14 mm short axis and cluster of subcarinal lymph nodes are again noted slightly more prominent in the subcarinal region with slight increase in number of lymph nodes suggested. Stable left para-aortic lymphadenopathy. Lungs/Pleura: Patchy ground-glass opacities are noted the visualized lung possibly representing hypoventilatory change. There is atelectasis at the right lung base. Upper Abdomen: The GE junction appears unremarkable. Stomach is contracted to the  extent visible. 12 mm indeterminate left hepatic hypodensity is suggested on current exam not apparent on previous. Musculoskeletal: Interval progression of osseous metastatic disease with interval increase in size of several lytic foci in particular the manubrium, right scapula, T2,T4, the T9 and T11 vertebral bodies. Suspicious for epidural involvement of the left at T11. Multiple osteolytic bony destruction of bilateral ribs soft tissue components. Review of the MIP images confirms the above findings. IMPRESSION: Positive for acute PE with CT evidence of right heart strain (RV/LV Ratio = 1) consistent with at least submassive (intermediate risk) PE. The presence of right heart strain has been associated with an increased risk of morbidity and mortality. Please activate Code PE by paging [phone].  Critical Value/emergent results were called by telephone at the time of interpretation on 03/02/2016 at 8:05 pm to Dr. Davonna Belling , who verbally acknowledged these results. Distal esophageal mass with progression of metastatic disease. Of note, suggestion of epidural involvement at T11 on the left. Electronically Signed: By: Ashley Royalty M.D. On: 03/02/2016 20:05   Nm Bone Scan Whole Body  Result Date: 03/02/2016 CLINICAL DATA:  History of esophageal cancer.  Back and chest pain. EXAM: NUCLEAR MEDICINE WHOLE BODY BONE SCAN TECHNIQUE: Whole body anterior and posterior images were obtained approximately 3 hours after intravenous injection of radiopharmaceutical. RADIOPHARMACEUTICALS:  26.0 mCi Technetium-54m MDP IV COMPARISON:  Abdomen and pelvis CT, 03/02/2016. Chest CT, 02/09/2016. FINDINGS: There multiple areas of abnormal radiotracer localization consistent with metastatic disease to bone, corresponding to lytic lesions noted on the prior CTs. Specifically, there are small foci of uptake in the calvarium. Abnormal uptake is seen in the sternum and involving multiple ribs as well as the right scapula.  There is uptake from the spine most prominently from the T10. More mild appearing uptake is noted in the pelvis. There is uptake involving the right mid femur. There also areas of degenerative uptake involving the sternoclavicular joints knees and feet. Renal uptake is symmetric. IMPRESSION: Multiple areas of abnormal uptake consistent with metastatic disease to bone, corresponding to lytic bone lesions noted on the recent chest and abdomen and pelvis CTs. Electronically Signed   By: Lajean Manes M.D.   On: 03/02/2016 16:40   Ct Abdomen Pelvis W Contrast  Result Date: 03/02/2016 CLINICAL DATA:  Patient with esophageal carcinoma. EXAM: CT ABDOMEN AND PELVIS WITH CONTRAST TECHNIQUE: Multidetector CT imaging of the abdomen and pelvis was performed using the standard protocol following bolus administration of intravenous contrast. CONTRAST:  100 cc Isovue-300 COMPARISON:  Chest CT 02/09/2016. FINDINGS: Lower chest: There is masslike thickening of the distal esophagus (image 4; series 201. Enlarged periaortic lymph nodes about the distal descending thoracic aorta measuring up to 1.4 cm (image 8; series 201). Suggestion of possible filling defect within a right lower lobe pulmonary artery (image 4; series 201). Additionally within the subpleural right lower lobe there is triangular-shaped ground-glass and consolidative opacity (image 15; series 205). Hepatobiliary: Multiple low-attenuation lesions are demonstrated throughout the liver with a reference lesion in the left hepatic lobe measuring 1.2 cm (image 9; series 201) and a reference lesion within the inferior right hepatic lobe measuring 5.0 x 3.3 cm (image 35; series 201). Patient status post cholecystectomy. Pancreas: Within the pancreatic tail there is a 1.5 cm enhancing lesion (image 33; series 201). The remainder the pancreas is unremarkable. Spleen: Unremarkable Adrenals/Urinary Tract: Mild nodularity of the adrenal glands bilaterally. Kidneys enhance  symmetrically with contrast. Urinary bladder is unremarkable. Too small to characterize subcentimeter partially exophytic lesion off the interpolar region of the right kidney (image 32; series 201). Stomach/Bowel: No evidence for bowel obstruction. Stool throughout the colon. No free fluid or free intraperitoneal air. Normal morphology of the stomach. Two adjacent prominent small bowel mesenteric lymph nodes measuring up to 10 mm (image 42; series 2 with 3), potentially reactive. Metastatic lesions not excluded. Vascular/Lymphatic: Normal caliber abdominal aorta. Peripheral calcified atherosclerotic plaque. Retroaortic left renal vein. Reproductive: Prostate is unremarkable. Other: Small bilateral fat containing inguinal hernias. Musculoskeletal: Multiple lytic lesions are demonstrated throughout the visualized ribs, thoracic spine, lumbar spine as well as the pelvis. There is a 4.5 x 2.2 cm lytic lesion with soft tissue mass involving the posterior aspect of  the T11 vertebral body which appears to narrow the spinal canal (image 14; series 201). Reference 4.3 cm lytic lesion within the left ilium (image 64; series 201). IMPRESSION: Findings suggestive of embolus within the visualized right lower lobe pulmonary arteries. There is an adjacent triangular-shaped subpleural ground-glass and consolidative opacity within the right lower lobe which is favored represent associated pulmonary infarct. Consider dedicated evaluation with CTA chest. Masslike thickening of the distal esophagus compatible with primary esophageal carcinoma. Multiple enlarged para-aortic lymph nodes compatible with metastatic disease. Additionally there are multiple metastatic lesions involving the liver and visualized skeleton. Note is made of a large lytic lesion with soft tissue mass involving the T11 vertebral body which appears to narrow the spinal canal at this level. Nonspecific enhancing soft tissue mass at the level of the distal pancreas  which may represent enhancing primary pancreatic lesion versus splenule. Metastatic disease is considered less likely. Critical Value/emergent results were called by telephone at the time of interpretation on 03/02/2016 at 2:16 pm to Dr. Marin Olp, who verbally acknowledged these results. Electronically Signed   By: Lovey Newcomer M.D.   On: 03/02/2016 14:17   Dg Shoulder Left  Result Date: 03/02/2016 CLINICAL DATA:  Felt pop at left shoulder when getting up. Initial encounter. EXAM: LEFT SHOULDER - 2+ VIEW COMPARISON:  None. FINDINGS: There is no evidence of fracture or dislocation. The left humeral head is seated within the glenoid fossa. The acromioclavicular joint is unremarkable in appearance. No significant soft tissue abnormalities are seen. The visualized portions of the left lung are clear. IMPRESSION: No evidence of fracture or dislocation. Electronically Signed   By: Garald Balding M.D.   On: 03/02/2016 19:55     EKG: Independently reviewed. Sinus rhythm, tachycardia, QTc 479, early R-wave progression  Assessment/Plan Principal Problem:   PE (pulmonary thromboembolism) (HCC) Active Problems:   Esophageal cancer, stage IV (HCC)   Essential hypertension   HLD (hyperlipidemia)   Anxiety   Pulmonary embolism (HCC)   Hypokalemia   PE (pulmonary thromboembolism) ([place]): pt has submassive PE with evidence of R heart straining by CTA. Patient is asymptomatic except for back pain, normal oxygen saturation on room air, hemodynamically stable. EDP dicussed with oncology, Dr. Marin Olp who recommended Lovenox and pt received 1 dose of Lovenox. EDP also consulted PCCM, Dr. Oletta Darter recommended IV heparin in case patient deteriorates and needs procedure.  -Place on tele bed for obs.  -switch to IV heparin drip per pharm (pt received one dose of Lovenox). -2D echocardiogram ordered -LE dopplers ordered to evaluate for DVT -pain control: continue home MS contin and Morphine -prn Albuterol nebs  HLD:  Last LDL was not on record -Continue home medications: Pravastatin  Anxiety: -prn Xanax  Essential hypertension: -continue metoprolol -Hold HCTZ in the setting of acute submassive PE -IV hydralazine when necessary  Esophageal cancer, stage IV ([place]): Metastasized to bone. Followed up by oncology, Dr. Burr Medico. Started chemo on 12/28. No surgery and XRT.  -please inform Dr. Burr Medico in AM (pt states that his chemo infusion is supposed to be stopped tomorrow) -continue home pain meds.  Alcohol drinking: pt states he drinks 2 or 3 beers every day. -start CIWA  Hypokalemia: K= 3.2 on admission. - Repleted - Check Mg level    DVT ppx: IV Heparin  (received one dose of  Lovenox) Code Status: Full code Family Communication: Yes, patient's  daughter and son  at bed side Disposition Plan:  Anticipate discharge back to previous home environment Consults called:  PCCM, Dr. Oletta Darter Admission status: Obs / tele    Date of Service 03/02/2016    Ivor Costa [facility] Pager [phone]  If 7PM-7AM, please contact night-coverage [email or web address] Password [email or web address] 03/02/2016, 10:02 PM

## 2016-03-02 NOTE — ED Notes (Signed)
Placed pt on 2L of O2 [place] due to low O2 stats (88%)  O2 level increased to normal levels

## 2016-03-02 NOTE — ED Provider Notes (Signed)
[facility] DEPT Provider Note   CSN: [id number] Arrival date & time: 03/02/16  1548     History   Chief Complaint Chief Complaint  Patient presents with  . Pulmonary Embolis    HPI Walter Craig is a 60 y.o. male.  HPI Patient has a history of esophageal cancer. Had CT scan of his abdomen done today that showed pulmonary embolism. No chest pain shortness of breath. Also had a bone scan today. States he was attempting to get up he had a pop in his left shoulder. He states he previously dislocated this felt a little different. No swelling in his legs. No hemoptysis.   Past Medical History:  Diagnosis Date  . Cancer ([place]) 02/2016   low esophagus cancer, mets   . Depression with anxiety   . HLD (hyperlipidemia)   . Hypertension   . Sleep apnea     Patient Active Problem List   Diagnosis Date Noted  . PE (pulmonary thromboembolism) ([place]) 03/02/2016  . Pulmonary embolism ([place]) 03/02/2016  . Hypokalemia 03/02/2016  . Essential hypertension   . HLD (hyperlipidemia)   . Anxiety   . Hypercalcemia 02/28/2016  . Pain of metastatic malignancy 02/28/2016  . Esophageal cancer, stage IV ([place]) 02/22/2016    Past Surgical History:  Procedure Laterality Date  . CHOLECYSTECTOMY    . IR GENERIC HISTORICAL  02/24/2016   IR US GUIDE VASC ACCESS RIGHT 02/24/2016 Greggory Keen, MD [facility]  . IR GENERIC HISTORICAL  02/24/2016   IR FLUORO GUIDE PORT INSERTION RIGHT 02/24/2016 Greggory Keen, MD [facility]  . left knee meniscus repare          Home Medications    Prior to Admission medications   Medication Sig Start Date End Date Taking? Authorizing Provider  ALPRAZolam Duanne Moron) 0.5 MG tablet Take 0.5 mg by mouth at bedtime as needed for sleep.   Yes Historical Provider, MD  cyclobenzaprine (FLEXERIL) 5 MG tablet Take 1-2 tablets (5-10 mg total) by mouth 3 (three) times daily as needed for muscle spasms. 02/28/16  Yes Truitt Merle, MD  diclofenac (VOLTAREN) 75 MG EC tablet  Take 75 mg by mouth 2 (two) times daily as needed (pain/inflammation).  02/01/16  Yes Historical Provider, MD  fluticasone (FLONASE) 50 MCG/ACT nasal spray Place 1 spray into both nostrils daily as needed (congestion).   Yes Historical Provider, MD  hydrochlorothiazide (HYDRODIURIL) 25 MG tablet Take 25 mg by mouth daily. 01/18/16  Yes Historical Provider, MD  lidocaine-prilocaine (EMLA) cream Apply to affected area once Patient taking differently: Apply 1 application topically once as needed (prior to port access).  02/28/16  Yes Truitt Merle, MD  metoprolol succinate (TOPROL-XL) 25 MG 24 hr tablet Take 25 mg by mouth daily.  01/23/16  Yes Historical Provider, MD  morphine (MS CONTIN) 15 MG 12 hr tablet Take 1 tablet (15 mg total) by mouth every 8 (eight) hours. 02/28/16  Yes Truitt Merle, MD  Morphine Sulfate (MORPHINE CONCENTRATE) 10 MG/0.5ML SOLN concentrated solution Take 0.25-0.5 mLs (5-10 mg total) by mouth every 4 (four) hours as needed for severe pain. 03/01/16  Yes Maryanna Shape, NP  ondansetron (ZOFRAN) 8 MG tablet Take 1 tablet (8 mg total) by mouth 2 (two) times daily as needed for refractory nausea / vomiting. Start on day 3 after chemotherapy. 02/28/16  Yes Truitt Merle, MD  pravastatin (PRAVACHOL) 80 MG tablet Take 80 mg by mouth daily.   Yes Historical Provider, MD  prochlorperazine (COMPAZINE) 10 MG tablet Take  1 tablet (10 mg total) by mouth every 6 (six) hours as needed (Nausea or vomiting). 02/28/16  Yes Truitt Merle, MD  zolpidem (AMBIEN) 10 MG tablet Take 10 mg by mouth at bedtime.  01/20/16  Yes Historical Provider, MD    Family History Family History  Problem Relation Age of Onset  . Cancer Father     colon cancer   . Cancer Sister     colon cancer   . Cancer Brother 91    gallbladder cancer     Social History Social History  Substance Use Topics  . Smoking status: Former Smoker    Packs/day: 1.50    Years: 30.00    Quit date: 03/05/2004  . Smokeless tobacco: Never Used  .  Alcohol use 1.8 - 2.4 oz/week    2 - 3 Cans of beer, 1 Shots of liquor per week     Comment: daily      Allergies   Patient has no known allergies.   Review of Systems Review of Systems  Constitutional: Negative for appetite change.  HENT: Negative for congestion.   Respiratory: Negative for shortness of breath.   Cardiovascular: Negative for chest pain and leg swelling.  Gastrointestinal: Positive for abdominal pain.  Genitourinary: Negative for dysuria.  Musculoskeletal: Negative for back pain.  Neurological: Negative for headaches.  Psychiatric/Behavioral: Negative for confusion.     Physical Exam Updated Vital Signs BP 125/79 (BP Location: Left Arm)   Pulse 94   Temp 98.8 F (37.1 C) (Oral)   Resp 18   Ht 5\' 10"  (1.778 m)   Wt 222 lb 4.8 oz (100.8 kg)   SpO2 97%   BMI 31.90 kg/m   Physical Exam  Constitutional: He appears well-developed.  HENT:  Head: Normocephalic.  Eyes: Pupils are equal, round, and reactive to light.  Cardiovascular:  Mild tachycardia with speaking. No dyspnea.  Pulmonary/Chest: Effort normal. No respiratory distress.  Abdominal: Soft. There is no tenderness.  Musculoskeletal: He exhibits no edema.  Neurological: He is alert.  Skin: Skin is warm. Capillary refill takes less than 2 seconds.     ED Treatments / Results  Labs (all labs ordered are listed, but only abnormal results are displayed) Labs Reviewed  BASIC METABOLIC PANEL - Abnormal; Notable for the following:       Result Value   Sodium 133 (*)    Potassium 3.2 (*)    Chloride 93 (*)    Glucose, Bld 178 (*)    BUN 26 (*)    All other components within normal limits  CBC - Abnormal; Notable for the following:    WBC 17.5 (*)    RBC 4.19 (*)    Hemoglobin 12.9 (*)    HCT 36.6 (*)    All other components within normal limits  PROTIME-INR  APTT  MAGNESIUM  URINALYSIS, ROUTINE W REFLEX MICROSCOPIC  BASIC METABOLIC PANEL  CBC  APTT  I-STAT TROPOININ, ED    EKG   EKG Interpretation None       Radiology Ct Angio Chest Pe W And/or Wo Contrast  Addendum Date: 03/02/2016   ADDENDUM REPORT: 03/02/2016 20:59 ADDENDUM: There soft tissue densities outlined by contrast in the distal SVC and right atrium potentially representing thrombus as well. Electronically Signed   By: Ashley Royalty M.D.   On: 03/02/2016 20:59   Result Date: 03/02/2016 CLINICAL DATA:  Metastatic esophageal cancer with dyspnea. EXAM: CT ANGIOGRAPHY CHEST WITH CONTRAST TECHNIQUE: Multidetector CT imaging of the chest was  performed using the standard protocol during bolus administration of intravenous contrast. Multiplanar CT image reconstructions and MIPs were obtained to evaluate the vascular anatomy. CONTRAST:  80 cc of Isovue 370 IV COMPARISON:  02/09/2016 chest CT FINDINGS: Cardiovascular: Acute bilateral lobar through segmental right upper, lobar through subsegmental right lower and left upper lobar through segmental pulmonary emboli are noted with right heart strain (RV/ LV ratio = 1). Calcified coronary atherosclerosis with minimal calcified aortic atherosclerosis. Top normal size cardiac chambers without pericardial effusion. Mediastinum/Nodes: Masslike abnormality in the distal esophagus is again noted involving the lower third with single wall thickness up to 2.1 cm unchanged in appearance. Associated posterior and subcarinal mediastinal lymphadenopathy is again identified without significant change. Superimposed mild AP window adenopathy to 12 mm short axis, paraesophageal adenopathy to 14 mm short axis and cluster of subcarinal lymph nodes are again noted slightly more prominent in the subcarinal region with slight increase in number of lymph nodes suggested. Stable left para-aortic lymphadenopathy. Lungs/Pleura: Patchy ground-glass opacities are noted the visualized lung possibly representing hypoventilatory change. There is atelectasis at the right lung base. Upper Abdomen: The GE junction  appears unremarkable. Stomach is contracted to the extent visible. 12 mm indeterminate left hepatic hypodensity is suggested on current exam not apparent on previous. Musculoskeletal: Interval progression of osseous metastatic disease with interval increase in size of several lytic foci in particular the manubrium, right scapula, T2,T4, the T9 and T11 vertebral bodies. Suspicious for epidural involvement of the left at T11. Multiple osteolytic bony destruction of bilateral ribs soft tissue components. Review of the MIP images confirms the above findings. IMPRESSION: Positive for acute PE with CT evidence of right heart strain (RV/LV Ratio = 1) consistent with at least submassive (intermediate risk) PE. The presence of right heart strain has been associated with an increased risk of morbidity and mortality. Please activate Code PE by paging [phone]. Critical Value/emergent results were called by telephone at the time of interpretation on 03/02/2016 at 8:05 pm to Dr. Davonna Belling , who verbally acknowledged these results. Distal esophageal mass with progression of metastatic disease. Of note, suggestion of epidural involvement at T11 on the left. Electronically Signed: By: Ashley Royalty M.D. On: 03/02/2016 20:05   Nm Bone Scan Whole Body  Result Date: 03/02/2016 CLINICAL DATA:  History of esophageal cancer.  Back and chest pain. EXAM: NUCLEAR MEDICINE WHOLE BODY BONE SCAN TECHNIQUE: Whole body anterior and posterior images were obtained approximately 3 hours after intravenous injection of radiopharmaceutical. RADIOPHARMACEUTICALS:  26.0 mCi Technetium-24m MDP IV COMPARISON:  Abdomen and pelvis CT, 03/02/2016. Chest CT, 02/09/2016. FINDINGS: There multiple areas of abnormal radiotracer localization consistent with metastatic disease to bone, corresponding to lytic lesions noted on the prior CTs. Specifically, there are small foci of uptake in the calvarium. Abnormal uptake is seen in the sternum and  involving multiple ribs as well as the right scapula. There is uptake from the spine most prominently from the T10. More mild appearing uptake is noted in the pelvis. There is uptake involving the right mid femur. There also areas of degenerative uptake involving the sternoclavicular joints knees and feet. Renal uptake is symmetric. IMPRESSION: Multiple areas of abnormal uptake consistent with metastatic disease to bone, corresponding to lytic bone lesions noted on the recent chest and abdomen and pelvis CTs. Electronically Signed   By: Lajean Manes M.D.   On: 03/02/2016 16:40   Ct Abdomen Pelvis W Contrast  Result Date: 03/02/2016 CLINICAL DATA:  Patient with esophageal carcinoma.  EXAM: CT ABDOMEN AND PELVIS WITH CONTRAST TECHNIQUE: Multidetector CT imaging of the abdomen and pelvis was performed using the standard protocol following bolus administration of intravenous contrast. CONTRAST:  100 cc Isovue-300 COMPARISON:  Chest CT 02/09/2016. FINDINGS: Lower chest: There is masslike thickening of the distal esophagus (image 4; series 201. Enlarged periaortic lymph nodes about the distal descending thoracic aorta measuring up to 1.4 cm (image 8; series 201). Suggestion of possible filling defect within a right lower lobe pulmonary artery (image 4; series 201). Additionally within the subpleural right lower lobe there is triangular-shaped ground-glass and consolidative opacity (image 15; series 205). Hepatobiliary: Multiple low-attenuation lesions are demonstrated throughout the liver with a reference lesion in the left hepatic lobe measuring 1.2 cm (image 9; series 201) and a reference lesion within the inferior right hepatic lobe measuring 5.0 x 3.3 cm (image 35; series 201). Patient status post cholecystectomy. Pancreas: Within the pancreatic tail there is a 1.5 cm enhancing lesion (image 33; series 201). The remainder the pancreas is unremarkable. Spleen: Unremarkable Adrenals/Urinary Tract: Mild nodularity  of the adrenal glands bilaterally. Kidneys enhance symmetrically with contrast. Urinary bladder is unremarkable. Too small to characterize subcentimeter partially exophytic lesion off the interpolar region of the right kidney (image 32; series 201). Stomach/Bowel: No evidence for bowel obstruction. Stool throughout the colon. No free fluid or free intraperitoneal air. Normal morphology of the stomach. Two adjacent prominent small bowel mesenteric lymph nodes measuring up to 10 mm (image 42; series 2 with 3), potentially reactive. Metastatic lesions not excluded. Vascular/Lymphatic: Normal caliber abdominal aorta. Peripheral calcified atherosclerotic plaque. Retroaortic left renal vein. Reproductive: Prostate is unremarkable. Other: Small bilateral fat containing inguinal hernias. Musculoskeletal: Multiple lytic lesions are demonstrated throughout the visualized ribs, thoracic spine, lumbar spine as well as the pelvis. There is a 4.5 x 2.2 cm lytic lesion with soft tissue mass involving the posterior aspect of the T11 vertebral body which appears to narrow the spinal canal (image 14; series 201). Reference 4.3 cm lytic lesion within the left ilium (image 64; series 201). IMPRESSION: Findings suggestive of embolus within the visualized right lower lobe pulmonary arteries. There is an adjacent triangular-shaped subpleural ground-glass and consolidative opacity within the right lower lobe which is favored represent associated pulmonary infarct. Consider dedicated evaluation with CTA chest. Masslike thickening of the distal esophagus compatible with primary esophageal carcinoma. Multiple enlarged para-aortic lymph nodes compatible with metastatic disease. Additionally there are multiple metastatic lesions involving the liver and visualized skeleton. Note is made of a large lytic lesion with soft tissue mass involving the T11 vertebral body which appears to narrow the spinal canal at this level. Nonspecific enhancing soft  tissue mass at the level of the distal pancreas which may represent enhancing primary pancreatic lesion versus splenule. Metastatic disease is considered less likely. Critical Value/emergent results were called by telephone at the time of interpretation on 03/02/2016 at 2:16 pm to Dr. Marin Olp, who verbally acknowledged these results. Electronically Signed   By: Lovey Newcomer M.D.   On: 03/02/2016 14:17   Dg Shoulder Left  Result Date: 03/02/2016 CLINICAL DATA:  Felt pop at left shoulder when getting up. Initial encounter. EXAM: LEFT SHOULDER - 2+ VIEW COMPARISON:  None. FINDINGS: There is no evidence of fracture or dislocation. The left humeral head is seated within the glenoid fossa. The acromioclavicular joint is unremarkable in appearance. No significant soft tissue abnormalities are seen. The visualized portions of the left lung are clear. IMPRESSION: No evidence of fracture or dislocation.  Electronically Signed   By: Garald Balding M.D.   On: 03/02/2016 19:55    Procedures Procedures (including critical care time)  Medications Ordered in ED Medications  ALPRAZolam (XANAX) tablet 0.5 mg (not administered)  fluticasone (FLONASE) 50 MCG/ACT nasal spray 1 spray (not administered)  morphine CONCENTRATE 10 MG/0.5ML oral solution 5-10 mg (not administered)  cyclobenzaprine (FLEXERIL) tablet 5-10 mg (not administered)  lidocaine-prilocaine (EMLA) cream 1 application (not administered)  morphine (MS CONTIN) 12 hr tablet 15 mg (not administered)  ondansetron (ZOFRAN) tablet 8 mg (not administered)  metoprolol succinate (TOPROL-XL) 24 hr tablet 25 mg (not administered)  pravastatin (PRAVACHOL) tablet 80 mg (80 mg Oral Given 03/02/16 2313)  zolpidem (AMBIEN) tablet 10 mg (10 mg Oral Given 03/02/16 2307)  0.9 %  sodium chloride infusion ( Intravenous New Bag/Given 03/02/16 2156)  sodium chloride flush (NS) 0.9 % injection 3 mL (3 mLs Intravenous Not Given 03/02/16 2200)  acetaminophen (TYLENOL) tablet  650 mg (not administered)    Or  acetaminophen (TYLENOL) suppository 650 mg (not administered)  hydrALAZINE (APRESOLINE) injection 5 mg (not administered)  LORazepam (ATIVAN) tablet 1 mg (not administered)    Or  LORazepam (ATIVAN) injection 1 mg (not administered)  thiamine (VITAMIN B-1) tablet 100 mg (100 mg Oral Given 03/02/16 2308)    Or  thiamine (B-1) injection 100 mg ( Intravenous See Alternative [id number])  folic acid (FOLVITE) tablet 1 mg (1 mg Oral Given 03/02/16 2308)  multivitamin with minerals tablet 1 tablet (1 tablet Oral Given 03/02/16 2308)  LORazepam (ATIVAN) injection 0-4 mg (2 mg Intravenous Given 03/02/16 2309)    Followed by  LORazepam (ATIVAN) injection 0-4 mg (not administered)  heparin ADULT infusion 100 units/mL (25000 units/272mL sodium chloride 0.45%) (not administered)  albuterol (PROVENTIL) (2.5 MG/3ML) 0.083% nebulizer solution 2.5 mg (not administered)  polyethylene glycol (MIRALAX / GLYCOLAX) packet 17 g (not administered)  docusate sodium (COLACE) capsule 100 mg (not administered)  iopamidol (ISOVUE-370) 76 % injection 100 mL (80 mLs Intravenous Contrast Given 03/02/16 1914)  potassium chloride 20 MEQ/15ML (10%) solution 30 mEq (30 mEq Oral Given 03/02/16 2309)     Initial Impression / Assessment and Plan / ED Course  I have reviewed the triage vital signs and the nursing notes.  Pertinent labs & imaging results that were available during my care of the patient were reviewed by me and considered in my medical decision making (see chart for details).  Clinical Course     Patient with incidental finding of pulmonary embolism on abdominal pelvis CT. Has history of cancer. CT scan of the chest done and showed bilateral PEs was slightly elevated RV size. Discussed with pulmonology, who will follow the patient. Patient started on Lovenox under oncology's recommendation. Admit to internal medicine.  Final Clinical Impressions(s) / ED Diagnoses    Final diagnoses:  Essential hypertension  Hyperlipidemia, unspecified hyperlipidemia type  Depression with anxiety  Other acute pulmonary embolism without acute cor pulmonale ([place])  PE (pulmonary thromboembolism) [facility])    New Prescriptions Current Discharge Medication List       Davonna Belling, MD 03/02/16 2344

## 2016-03-02 NOTE — ED Notes (Signed)
Attempted to call report

## 2016-03-03 ENCOUNTER — Observation Stay (HOSPITAL_COMMUNITY): Payer: BLUE CROSS/BLUE SHIELD

## 2016-03-03 ENCOUNTER — Observation Stay (HOSPITAL_BASED_OUTPATIENT_CLINIC_OR_DEPARTMENT_OTHER): Payer: BLUE CROSS/BLUE SHIELD

## 2016-03-03 DIAGNOSIS — Z87891 Personal history of nicotine dependence: Secondary | ICD-10-CM | POA: Diagnosis not present

## 2016-03-03 DIAGNOSIS — I2782 Chronic pulmonary embolism: Secondary | ICD-10-CM

## 2016-03-03 DIAGNOSIS — C7951 Secondary malignant neoplasm of bone: Secondary | ICD-10-CM | POA: Diagnosis present

## 2016-03-03 DIAGNOSIS — S46912A Strain of unspecified muscle, fascia and tendon at shoulder and upper arm level, left arm, initial encounter: Secondary | ICD-10-CM | POA: Diagnosis present

## 2016-03-03 DIAGNOSIS — I10 Essential (primary) hypertension: Secondary | ICD-10-CM | POA: Diagnosis present

## 2016-03-03 DIAGNOSIS — Z800 Family history of malignant neoplasm of digestive organs: Secondary | ICD-10-CM | POA: Diagnosis not present

## 2016-03-03 DIAGNOSIS — E785 Hyperlipidemia, unspecified: Secondary | ICD-10-CM

## 2016-03-03 DIAGNOSIS — G4733 Obstructive sleep apnea (adult) (pediatric): Secondary | ICD-10-CM | POA: Diagnosis present

## 2016-03-03 DIAGNOSIS — I2699 Other pulmonary embolism without acute cor pulmonale: Secondary | ICD-10-CM

## 2016-03-03 DIAGNOSIS — G893 Neoplasm related pain (acute) (chronic): Secondary | ICD-10-CM | POA: Diagnosis present

## 2016-03-03 DIAGNOSIS — C159 Malignant neoplasm of esophagus, unspecified: Secondary | ICD-10-CM | POA: Diagnosis present

## 2016-03-03 DIAGNOSIS — Z7951 Long term (current) use of inhaled steroids: Secondary | ICD-10-CM | POA: Diagnosis not present

## 2016-03-03 DIAGNOSIS — E876 Hypokalemia: Secondary | ICD-10-CM | POA: Diagnosis present

## 2016-03-03 DIAGNOSIS — F418 Other specified anxiety disorders: Secondary | ICD-10-CM | POA: Diagnosis present

## 2016-03-03 LAB — URINALYSIS, ROUTINE W REFLEX MICROSCOPIC
BILIRUBIN URINE: NEGATIVE
Bacteria, UA: NONE SEEN
Glucose, UA: 50 mg/dL — AB
HGB URINE DIPSTICK: NEGATIVE
KETONES UR: NEGATIVE mg/dL
Leukocytes, UA: NEGATIVE
Nitrite: NEGATIVE
PROTEIN: 30 mg/dL — AB
Specific Gravity, Urine: 1.03 (ref 1.005–1.030)
Squamous Epithelial / LPF: NONE SEEN
pH: 7 (ref 5.0–8.0)

## 2016-03-03 LAB — BASIC METABOLIC PANEL
Anion gap: 11 (ref 5–15)
BUN: 20 mg/dL (ref 6–20)
CALCIUM: 8.6 mg/dL — AB (ref 8.9–10.3)
CHLORIDE: 95 mmol/L — AB (ref 101–111)
CO2: 29 mmol/L (ref 22–32)
CREATININE: 0.99 mg/dL (ref 0.61–1.24)
GFR calc Af Amer: >60 mL/min (ref >60)
GFR calc non Af Amer: >60 mL/min (ref >60)
GLUCOSE: 138 mg/dL — AB (ref 65–99)
Potassium: 3.8 mmol/L (ref 3.5–5.1)
Sodium: 135 mmol/L (ref 135–145)

## 2016-03-03 LAB — ECHOCARDIOGRAM COMPLETE
FS: 41 % (ref 28–44)
HEIGHTINCHES: 70 in
IVS/LV PW RATIO, ED: 1.1
LA ID, A-P, ES: 29 mm
LA diam end sys: 29 mm
LA vol A4C: 32.7 ml
LA vol index: 17.5 mL/m2
LADIAMINDEX: 1.33 cm/m2
LAVOL: 38.1 mL
LDCA: 4.52 cm2
LV PW d: 11.8 mm — AB (ref 0.6–1.1)
LV TDI E'LATERAL: 11.3
LV e' LATERAL: 11.3 cm/s
LVOTD: 24 mm
Lateral S' vel: 16.5 cm/s
TAPSE: 16.9 mm
TDI e' medial: 7.18
WEIGHTICAEL: 3556.8 [oz_av]

## 2016-03-03 LAB — HEPARIN LEVEL (UNFRACTIONATED): Heparin Unfractionated: 0.45 IU/mL (ref 0.30–0.70)

## 2016-03-03 LAB — MAGNESIUM: Magnesium: 2.4 mg/dL (ref 1.7–2.4)

## 2016-03-03 LAB — TROPONIN I
Troponin I: <0.03 ng/mL (ref <0.03)
Troponin I: 0.07 ng/mL (ref <0.03)

## 2016-03-03 LAB — APTT: aPTT: 36 seconds (ref 24–36)

## 2016-03-03 LAB — BRAIN NATRIURETIC PEPTIDE: B Natriuretic Peptide: 61.6 pg/mL (ref 0.0–100.0)

## 2016-03-03 MED ORDER — SODIUM CHLORIDE 0.9% FLUSH
10.0000 mL | Freq: Two times a day (BID) | INTRAVENOUS | Status: DC
  Administered 2016-03-03 – 2016-03-06 (×4): 10 mL

## 2016-03-03 MED ORDER — ENSURE ENLIVE PO LIQD
237.0000 mL | Freq: Three times a day (TID) | ORAL | Status: DC
  Administered 2016-03-03 – 2016-03-05 (×4): 237 mL via ORAL

## 2016-03-03 MED ORDER — ALTEPLASE 2 MG IJ SOLR
2.0000 mg | Freq: Once | INTRAMUSCULAR | Status: AC
  Administered 2016-03-03: 2 mg

## 2016-03-03 MED ORDER — SODIUM CHLORIDE 0.9% FLUSH: 10.0000 mL | INTRAVENOUS | Status: DC | PRN

## 2016-03-03 NOTE — Progress Notes (Signed)
Initial Nutrition Assessment  DOCUMENTATION CODES:   Obesity unspecified  INTERVENTION:  Ensure Enlive po TID, each supplement provides 350 kcal and 20 grams of protein   NUTRITION DIAGNOSIS:   Inadequate oral intake related to cancer and cancer related treatments, dysphagia as evidenced by per patient/family report, percent weight loss, energy intake < 75% for > or equal to 1 month.   GOAL:   Patient will meet greater than or equal to 90% of their needs   MONITOR:   PO intake, Supplement acceptance, Weight trends, I & O's, Labs  REASON FOR ASSESSMENT:   Malnutrition Screening Tool    ASSESSMENT:   64M with acute PE. PMH significant for stage IV esophageal cancer, HTN, HLD, anxiety, and OSA (unable to tolerate CPAP).   Pt states that he usually weighs 240 lbs. He reports losing from 240 lbs to 219 lbs in the past month due to eliminating alcohol from his diet as well as due to increased difficulty swallowing. He reports he has been eating 50% less than usual for the past month. He started drinking Ensure and Boost supplements at home 1-2 times daily. RD discussed high calorie soft foods and beverages for patient to snack on and encouraged increasing intake of nutritional supplements as needed to promote weight maintenance.   Labs: low chloride, low calcium  Diet Order:  Diet Heart Room service appropriate? Yes; Fluid consistency: Thin  Skin:  Reviewed, no issues  Last BM:  12/29  Height:   Ht Readings from Last 1 Encounters:  03/02/16 5\' 10"  (1.778 m)    Weight:   Wt Readings from Last 1 Encounters:  03/02/16 222 lb 4.8 oz (100.8 kg)    Ideal Body Weight:  75.45 kg  BMI:  Body mass index is 31.9 kg/m.  Estimated Nutritional Needs:   Kcal:  [id number]  Protein:  100-120 grams  Fluid:  2.5-2.8 L/day  EDUCATION NEEDS:   No education needs identified at this time  [place], CSP, LDN Inpatient Clinical Dietitian Pager: [phone] After Hours  Pager: [phone]

## 2016-03-03 NOTE — Progress Notes (Signed)
PROGRESS NOTE [facility]   Walter Craig   [id number] DOB: 05/27/1955  DOA: 03/02/2016 PCP: [name], MD   Brief Narrative:  Walter Craig is a 60 y.o. male with medical history significant of hypertension, hyperlipidemia, anxiety, OSA not on CPAP, alcohol drinking, newly diagnosed metastasized stage for esophageal cancer, who presents with back pain.  Patient states he has been having back pain recently. It involves both lower and upper back. It is constant, 8 out of 10 in severity, nonradiating. He also has intermittent bilateral shoulder blade pain. His PCP ordered CT abdomen/pelvis today, which showed pulmonary embolism. Pt denies cough, shortness of breath and chest pain. No fever, chills, nausea, vomiting, diarrhea, abdominal pain, symptoms of UTI or unilateral weakness.  Subjective: Patient seen and examined this morning with family at bedside. Patient report improvement in his breathing. Denies chest pain and palpitations. Patient is oxygen saturation is above 91% on 2 L [place]   Assessment & Plan: PE - submassive PE with evidence of right heart strain by CTA. Etiology likely to be history of cancer and chemotherapy port (clot on superior vena cava). Dr. 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 On chemotherapy infusions - last one was supposed to be today Bone scan shows multiple site metastasis Follow-up with oncology as an outpatient  Hypertension - stable Continue metoprolol Monitor BP  Hypokalemia resolved Replaced BMP in am  DVT prophylaxis: Heparin drip  Code Status: Full  Family Communication: Family at bedside  Disposition Plan: Anticipate discharged home next to 2 today  Consultants:   None   Procedures:   ECHO   Doppler    Antimicrobials:  None   Objective: Vitals:   03/02/16 2200 03/02/16 2208 03/02/16 2239 03/03/16 0500  BP: 118/76  125/79 102/67  Pulse: 89 87 94 93  Resp: 16 17 18 18   Temp:   98.8 F (37.1 C) 98.4 F (36.9 C)  TempSrc:   Oral Oral  SpO2: (!) 88% 95% 97% 91%  Weight:   100.8 kg (222 lb 4.8 oz)   Height:   5\' 10"  (1.778 m)     Intake/Output Summary (Last 24 hours) at 03/03/16 1753 Last data filed at 03/03/16 1550  Gross per 24 hour  Intake              735 ml  Output              300 ml  Net              435 ml   Filed Weights   03/02/16 1731 03/02/16 2239  Weight: 100.5 kg (221 lb 8 oz) 100.8 kg (222 lb 4.8 oz)    Examination:  General exam: Appears calm and comfortable  Respiratory system: Good air entry, scattered rhonchi  Cardiovascular system: S1 & S2 heard, RRR. No JVD, murmurs, rubs or gallops Central nervous system: Alert and oriented. No focal neurological deficits. Extremities: No pedal edema.    Skin: No rashes, lesions or ulcers   Data Reviewed: I have personally reviewed following labs and imaging studies  CBC:  Recent Labs Lab 02/29/16 0942 03/02/16 1900  WBC 16.1* 17.5*  NEUTROABS 13.6*  --   HGB 15.1 12.9*  HCT 43.1 36.6*  MCV 89.1 87.4  PLT 316 [id number]   Basic Metabolic Panel:  Recent Labs Lab 02/29/16 0942 03/02/16 1900 03/03/16 0348  NA 133* 133* 135  K 3.2* 3.2* 3.8  CL  --  93* 95*  CO2 29 28 29   GLUCOSE 118 178* 138*  BUN 22.6 26* 20  CREATININE 1.3 1.10 0.99  CALCIUM 9.8 8.9 8.6*  MG  --   --  2.4   GFR: Estimated Creatinine Clearance: 95.6 mL/min (by C-G formula based on SCr of 0.99 mg/dL). Liver Function Tests:  Recent Labs Lab 02/29/16 0942  AST 48*  ALT 41  ALKPHOS 123  BILITOT 0.85  PROT 7.9  ALBUMIN 3.4*   No results for input(s): LIPASE, AMYLASE in the last 168 hours. No results for input(s): AMMONIA in the last 168 hours. Coagulation Profile:  Recent Labs Lab 03/02/16 1832  INR 1.17   Cardiac  Enzymes:  Recent Labs Lab 03/03/16 0348 03/03/16 0753  TROPONINI <0.03 0.07*   BNP (last 3 results) No results for input(s): PROBNP in the last 8760 hours. HbA1C: No results for input(s): HGBA1C in the last 72 hours. CBG: No results for input(s): GLUCAP in the last 168 hours. Lipid Profile: No results for input(s): CHOL, HDL, LDLCALC, TRIG, CHOLHDL, LDLDIRECT in the last 72 hours. Thyroid Function Tests: No results for input(s): TSH, T4TOTAL, FREET4, T3FREE, THYROIDAB in the last 72 hours. Anemia Panel: No results for input(s): VITAMINB12, FOLATE, FERRITIN, TIBC, IRON, RETICCTPCT in the last 72 hours. Sepsis Labs: No results for input(s): PROCALCITON, LATICACIDVEN in the last 168 hours.  No results found for this or any previous visit (from the past 240 hour(s)).    Radiology Studies: Ct Angio Chest Pe W And/or Wo Contrast  Addendum Date: 03/02/2016   ADDENDUM REPORT: 03/02/2016 20:59 ADDENDUM: There soft tissue densities outlined by contrast in the distal SVC and right atrium potentially representing thrombus as well. Electronically Signed   By: Ashley Royalty M.D.   On: 03/02/2016 20:59   Result Date: 03/02/2016 CLINICAL DATA:  Metastatic esophageal cancer with dyspnea. EXAM: CT ANGIOGRAPHY CHEST WITH CONTRAST TECHNIQUE: Multidetector CT imaging of the chest was performed using the standard protocol during bolus administration of intravenous contrast. Multiplanar CT image reconstructions and MIPs were obtained to evaluate the vascular anatomy. CONTRAST:  80 cc of Isovue 370 IV COMPARISON:  02/09/2016 chest CT FINDINGS: Cardiovascular: Acute bilateral lobar through segmental right upper, lobar through subsegmental right lower and left upper lobar through segmental pulmonary emboli are noted with right heart strain (RV/ LV ratio = 1). Calcified coronary atherosclerosis with minimal calcified aortic atherosclerosis. Top normal size cardiac chambers without pericardial effusion.  Mediastinum/Nodes: Masslike abnormality in the distal esophagus is again noted involving the lower third with single wall thickness up to 2.1 cm unchanged in appearance. Associated posterior and subcarinal mediastinal lymphadenopathy is again identified without significant change. Superimposed mild AP window adenopathy to 12 mm short axis, paraesophageal adenopathy to 14 mm short axis and cluster of subcarinal lymph nodes are again noted slightly more prominent in the subcarinal region with slight increase in number of lymph nodes suggested. Stable left para-aortic lymphadenopathy. Lungs/Pleura: Patchy ground-glass opacities are noted the visualized lung possibly representing hypoventilatory change. There is atelectasis at the right lung base. Upper Abdomen: The GE junction appears unremarkable. Stomach is contracted to the extent visible. 12 mm indeterminate left hepatic hypodensity is suggested on current exam not apparent on previous. Musculoskeletal: Interval progression of osseous metastatic disease with interval increase in size of several lytic foci in particular the manubrium, right scapula, T2,T4, the  T9 and T11 vertebral bodies. Suspicious for epidural involvement of the left at T11. Multiple osteolytic bony destruction of bilateral ribs soft tissue components. Review of the MIP images confirms the above findings. IMPRESSION: Positive for acute PE with CT evidence of right heart strain (RV/LV Ratio = 1) consistent with at least submassive (intermediate risk) PE. The presence of right heart strain has been associated with an increased risk of morbidity and mortality. Please activate Code PE by paging [phone]. Critical Value/emergent results were called by telephone at the time of interpretation on 03/02/2016 at 8:05 pm to Dr. Davonna Belling , who verbally acknowledged these results. Distal esophageal mass with progression of metastatic disease. Of note, suggestion of epidural involvement at T11 on  the left. Electronically Signed: By: Ashley Royalty M.D. On: 03/02/2016 20:05   Nm Bone Scan Whole Body  Result Date: 03/02/2016 CLINICAL DATA:  History of esophageal cancer.  Back and chest pain. EXAM: NUCLEAR MEDICINE WHOLE BODY BONE SCAN TECHNIQUE: Whole body anterior and posterior images were obtained approximately 3 hours after intravenous injection of radiopharmaceutical. RADIOPHARMACEUTICALS:  26.0 mCi Technetium-78m MDP IV COMPARISON:  Abdomen and pelvis CT, 03/02/2016. Chest CT, 02/09/2016. FINDINGS: There multiple areas of abnormal radiotracer localization consistent with metastatic disease to bone, corresponding to lytic lesions noted on the prior CTs. Specifically, there are small foci of uptake in the calvarium. Abnormal uptake is seen in the sternum and involving multiple ribs as well as the right scapula. There is uptake from the spine most prominently from the T10. More mild appearing uptake is noted in the pelvis. There is uptake involving the right mid femur. There also areas of degenerative uptake involving the sternoclavicular joints knees and feet. Renal uptake is symmetric. IMPRESSION: Multiple areas of abnormal uptake consistent with metastatic disease to bone, corresponding to lytic bone lesions noted on the recent chest and abdomen and pelvis CTs. Electronically Signed   By: Lajean Manes M.D.   On: 03/02/2016 16:40   Ct Abdomen Pelvis W Contrast  Result Date: 03/02/2016 CLINICAL DATA:  Patient with esophageal carcinoma. EXAM: CT ABDOMEN AND PELVIS WITH CONTRAST TECHNIQUE: Multidetector CT imaging of the abdomen and pelvis was performed using the standard protocol following bolus administration of intravenous contrast. CONTRAST:  100 cc Isovue-300 COMPARISON:  Chest CT 02/09/2016. FINDINGS: Lower chest: There is masslike thickening of the distal esophagus (image 4; series 201. Enlarged periaortic lymph nodes about the distal descending thoracic aorta measuring up to 1.4 cm (image 8;  series 201). Suggestion of possible filling defect within a right lower lobe pulmonary artery (image 4; series 201). Additionally within the subpleural right lower lobe there is triangular-shaped ground-glass and consolidative opacity (image 15; series 205). Hepatobiliary: Multiple low-attenuation lesions are demonstrated throughout the liver with a reference lesion in the left hepatic lobe measuring 1.2 cm (image 9; series 201) and a reference lesion within the inferior right hepatic lobe measuring 5.0 x 3.3 cm (image 35; series 201). Patient status post cholecystectomy. Pancreas: Within the pancreatic tail there is a 1.5 cm enhancing lesion (image 33; series 201). The remainder the pancreas is unremarkable. Spleen: Unremarkable Adrenals/Urinary Tract: Mild nodularity of the adrenal glands bilaterally. Kidneys enhance symmetrically with contrast. Urinary bladder is unremarkable. Too small to characterize subcentimeter partially exophytic lesion off the interpolar region of the right kidney (image 32; series 201). Stomach/Bowel: No evidence for bowel obstruction. Stool throughout the colon. No free fluid or free intraperitoneal air. Normal morphology of the stomach. Two adjacent prominent small bowel  mesenteric lymph nodes measuring up to 10 mm (image 42; series 2 with 3), potentially reactive. Metastatic lesions not excluded. Vascular/Lymphatic: Normal caliber abdominal aorta. Peripheral calcified atherosclerotic plaque. Retroaortic left renal vein. Reproductive: Prostate is unremarkable. Other: Small bilateral fat containing inguinal hernias. Musculoskeletal: Multiple lytic lesions are demonstrated throughout the visualized ribs, thoracic spine, lumbar spine as well as the pelvis. There is a 4.5 x 2.2 cm lytic lesion with soft tissue mass involving the posterior aspect of the T11 vertebral body which appears to narrow the spinal canal (image 14; series 201). Reference 4.3 cm lytic lesion within the left ilium  (image 64; series 201). IMPRESSION: Findings suggestive of embolus within the visualized right lower lobe pulmonary arteries. There is an adjacent triangular-shaped subpleural ground-glass and consolidative opacity within the right lower lobe which is favored represent associated pulmonary infarct. Consider dedicated evaluation with CTA chest. Masslike thickening of the distal esophagus compatible with primary esophageal carcinoma. Multiple enlarged para-aortic lymph nodes compatible with metastatic disease. Additionally there are multiple metastatic lesions involving the liver and visualized skeleton. Note is made of a large lytic lesion with soft tissue mass involving the T11 vertebral body which appears to narrow the spinal canal at this level. Nonspecific enhancing soft tissue mass at the level of the distal pancreas which may represent enhancing primary pancreatic lesion versus splenule. Metastatic disease is considered less likely. Critical Value/emergent results were called by telephone at the time of interpretation on 03/02/2016 at 2:16 pm to Dr. Marin Olp, who verbally acknowledged these results. Electronically Signed   By: Lovey Newcomer M.D.   On: 03/02/2016 14:17   Dg Shoulder Left  Result Date: 03/02/2016 CLINICAL DATA:  Felt pop at left shoulder when getting up. Initial encounter. EXAM: LEFT SHOULDER - 2+ VIEW COMPARISON:  None. FINDINGS: There is no evidence of fracture or dislocation. The left humeral head is seated within the glenoid fossa. The acromioclavicular joint is unremarkable in appearance. No significant soft tissue abnormalities are seen. The visualized portions of the left lung are clear. IMPRESSION: No evidence of fracture or dislocation. Electronically Signed   By: Garald Balding M.D.   On: 03/02/2016 19:55    Scheduled Meds: . feeding supplement (ENSURE ENLIVE)  237 mL Oral TID BM  . folic acid  1 mg Oral Daily  . LORazepam  0-4 mg Intravenous Q6H   Followed by  . [START ON  03/04/2016] LORazepam  0-4 mg Intravenous Q12H  . metoprolol succinate  25 mg Oral Daily  . morphine  15 mg Oral Q8H  . multivitamin with minerals  1 tablet Oral Daily  . pravastatin  80 mg Oral Daily  . sodium chloride flush  3 mL Intravenous Q12H  . thiamine  100 mg Oral Daily   Or  . thiamine  100 mg Intravenous Daily  . zolpidem  10 mg Oral QHS   Continuous Infusions: . sodium chloride 75 mL/hr at 03/02/16 2156  . heparin 1,500 Units/hr (03/03/16 0758)     LOS: 0 days    Chipper Oman, MD [facility] Pager [phone]  If 7PM-7AM, please contact night-coverage [email or web address] Password [email or web address] 03/03/2016, 5:53 PM

## 2016-03-03 NOTE — Progress Notes (Signed)
ANTICOAGULATION CONSULT NOTE  Pharmacy Consult for heparin Indication: pulmonary embolus  No Known Allergies  Patient Measurements: Height: 5\' 10"  (177.8 cm) Weight: 222 lb 4.8 oz (100.8 kg) IBW/kg (Calculated) : 73 Heparin Dosing Weight: 94kg  Vital Signs:    Labs:  Recent Labs  03/02/16 1832 03/02/16 1900 03/03/16 0348 03/03/16 0753 03/03/16 1816 03/03/16 1817  HGB  --  12.9*  --   --   --   --   HCT  --  36.6*  --   --   --   --   PLT  --  351  --   --   --   --   APTT 29  --  36  --   --   --   LABPROT 15.0  --   --   --   --   --   INR 1.17  --   --   --   --   --   HEPARINUNFRC  --   --   --   --  0.45  --   CREATININE  --  1.10 0.99  --   --   --   TROPONINI  --   --  <0.03 0.07*  --  <0.03    Estimated Creatinine Clearance: 95.6 mL/min (by C-G formula based on SCr of 0.99 mg/dL).   Medical History: Past Medical History:  Diagnosis Date  . Cancer ([place]) 02/2016   low esophagus cancer, [facility]   . Depression with anxiety   . HLD (hyperlipidemia)   . Hypertension   . Sleep apnea     Medications:  Infusions:  . sodium chloride 75 mL/hr at 03/02/16 2156  . heparin 1,500 Units/hr (03/03/16 0758)    Assessment: 14 yom presented to the ED with confirmed PE in the setting of malignancy. Initially started on lovenox but now changing to IV heparin. Received a dose of lovenox tonight at ~8pm. Baseline labs WNL and he is not on anticoagulation PTA.   Initial HL is therapeutic at 0.45 on heparin 1500 units/hr. No issues with bleeding or infusion noted.  Goal of Therapy:  Heparin level 0.3-0.7 units/ml Monitor platelets by anticoagulation protocol: Yes   Plan:  Continue heparin 1500 units/hr Check an 8 hr heparin level Daily heparin level and CBC  Andrey Cota. Diona Foley, PharmD, [place] [facility] Pager [phone] 03/03/2016,7:10 PM

## 2016-03-03 NOTE — Progress Notes (Signed)
VASCULAR LAB PRELIMINARY  PRELIMINARY  PRELIMINARY  PRELIMINARY  Bilateral lower extremity venous duplex completed.    Preliminary report:  Bilateral:  No evidence of DVT, superficial thrombosis, or Baker's Cyst.   Walter Craig, RVS 03/03/2016, 4:44 PM

## 2016-03-04 DIAGNOSIS — E876 Hypokalemia: Secondary | ICD-10-CM

## 2016-03-04 LAB — BASIC METABOLIC PANEL
ANION GAP: 7 (ref 5–15)
BUN: 16 mg/dL (ref 6–20)
CO2: 29 mmol/L (ref 22–32)
Calcium: 8.3 mg/dL — ABNORMAL LOW (ref 8.9–10.3)
Chloride: 99 mmol/L — ABNORMAL LOW (ref 101–111)
Creatinine, Ser: 0.93 mg/dL (ref 0.61–1.24)
GLUCOSE: 103 mg/dL — AB (ref 65–99)
POTASSIUM: 3.4 mmol/L — AB (ref 3.5–5.1)
Sodium: 135 mmol/L (ref 135–145)

## 2016-03-04 LAB — CBC
HEMATOCRIT: 34.1 % — AB (ref 39.0–52.0)
Hemoglobin: 11.9 g/dL — ABNORMAL LOW (ref 13.0–17.0)
MCH: 30.9 pg (ref 26.0–34.0)
MCHC: 34.9 g/dL (ref 30.0–36.0)
MCV: 88.6 fL (ref 78.0–100.0)
Platelets: 285 10*3/uL (ref 150–400)
RBC: 3.85 MIL/uL — AB (ref 4.22–5.81)
RDW: 12.5 % (ref 11.5–15.5)
WBC: 8.4 10*3/uL (ref 4.0–10.5)

## 2016-03-04 LAB — HEPARIN LEVEL (UNFRACTIONATED): HEPARIN UNFRACTIONATED: 0.49 [IU]/mL (ref 0.30–0.70)

## 2016-03-04 MED ORDER — POTASSIUM CHLORIDE CRYS ER 20 MEQ PO TBCR
40.0000 meq | EXTENDED_RELEASE_TABLET | Freq: Once | ORAL | Status: AC
  Administered 2016-03-04: 40 meq via ORAL
  Filled 2016-03-04: qty 2

## 2016-03-04 NOTE — Progress Notes (Signed)
PROGRESS NOTE [facility]   Walter Craig   [id number] DOB: 01-13-1956  DOA: 03/02/2016 PCP: [name], MD   Brief Narrative:  Walter Craig is a 60 y.o. male with medical history significant of hypertension, hyperlipidemia, anxiety, OSA not on CPAP, alcohol drinking, newly diagnosed metastasized stage for esophageal cancer, who presents with back pain.  Patient states he has been having back pain recently. It involves both lower and upper back. It is constant, 8 out of 10 in severity, nonradiating. He also has intermittent bilateral shoulder blade pain. His PCP ordered CT abdomen/pelvis today, which showed pulmonary embolism. Pt denies cough, shortness of breath and chest pain. No fever, chills, nausea, vomiting, diarrhea, abdominal pain, symptoms of UTI or unilateral weakness.  Subjective: Patient seen and examined this morning with family at bedside. Patient report significant improvement on his breathing.  Now c/o Left shoulder pain.   Assessment & Plan: PE - submassive PE with evidence of right heart strain by CTA. Etiology likely to be history of cancer and chemotherapy port (clot on superior vena cava). Dr. 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 On chemotherapy infusions - last one was supposed to be today Bone scan shows multiple site metastasis Follow-up with oncology as an outpatient  Hypertension - stable Continue metoprolol Monitor BP  Shoulder pain - Left - strain from mechanical fall Alternate Ice and heat  Xray shows no fractures or dislocation  Pain med PRN  PT eval   Hypokalemia Replete BMP in am  DVT prophylaxis: Heparin drip  Code Status: Full  Family Communication: Family at bedside    Disposition Plan: Anticipate discharged home next to 1-2 days  Consultants:   None   Procedures:   ECHO   Doppler   Antimicrobials:  None   Objective: Vitals:   03/02/16 2239 03/03/16 0500 03/03/16 1936 03/04/16 0500  BP: 125/79 102/67 129/81 117/73  Pulse: 94 93 (!) 102 88  Resp: 18 18 18 18   Temp: 98.8 F (37.1 C) 98.4 F (36.9 C) 98 F (36.7 C) 98.4 F (36.9 C)  TempSrc: Oral Oral Oral Oral  SpO2: 97% 91% 95% 93%  Weight: 100.8 kg (222 lb 4.8 oz)     Height: 5\' 10"  (1.778 m)       Intake/Output Summary (Last 24 hours) at 03/04/16 1114 Last data filed at 03/04/16 0758  Gross per 24 hour  Intake           2930.5 ml  Output             1775 ml  Net           1155.5 ml   Filed Weights   03/02/16 1731 03/02/16 2239  Weight: 100.5 kg (221 lb 8 oz) 100.8 kg (222 lb 4.8 oz)    Examination:  General exam: NAD on O2 [place]   Respiratory system: CTA B/l no wheezing  Cardiovascular system: S1 & S2 heard, RRR.  Extremities: Left shoulder tenderness, Full ROM  Data Reviewed: I have personally reviewed following labs and imaging studies  CBC:  Recent Labs Lab 02/29/16 0942 03/02/16 1900 03/04/16 0551  WBC 16.1* 17.5* 8.4  NEUTROABS 13.6*  --   --   HGB 15.1 12.9* 11.9*  HCT 43.1 36.6*  34.1*  MCV 89.1 87.4 88.6  PLT 316 351 [id number]   Basic Metabolic Panel:  Recent Labs Lab 02/29/16 0942 03/02/16 1900 03/03/16 0348 03/04/16 0551  NA 133* 133* 135 135  K 3.2* 3.2* 3.8 3.4*  CL  --  93* 95* 99*  CO2 29 28 29 29   GLUCOSE 118 178* 138* 103*  BUN 22.6 26* 20 16  CREATININE 1.3 1.10 0.99 0.93  CALCIUM 9.8 8.9 8.6* 8.3*  MG  --   --  2.4  --    GFR: Estimated Creatinine Clearance: 101.7 mL/min (by C-G formula based on SCr of 0.93 mg/dL). Liver Function Tests:  Recent Labs Lab 02/29/16 0942  AST 48*  ALT 41  ALKPHOS 123  BILITOT 0.85  PROT 7.9  ALBUMIN 3.4*   No results for input(s): LIPASE, AMYLASE in the last 168 hours. No results for  input(s): AMMONIA in the last 168 hours. Coagulation Profile:  Recent Labs Lab 03/02/16 1832  INR 1.17   Cardiac Enzymes:  Recent Labs Lab 03/03/16 0348 03/03/16 0753 03/03/16 1817  TROPONINI <0.03 0.07* <0.03   BNP (last 3 results) No results for input(s): PROBNP in the last 8760 hours. HbA1C: No results for input(s): HGBA1C in the last 72 hours. CBG: No results for input(s): GLUCAP in the last 168 hours. Lipid Profile: No results for input(s): CHOL, HDL, LDLCALC, TRIG, CHOLHDL, LDLDIRECT in the last 72 hours. Thyroid Function Tests: No results for input(s): TSH, T4TOTAL, FREET4, T3FREE, THYROIDAB in the last 72 hours. Anemia Panel: No results for input(s): VITAMINB12, FOLATE, FERRITIN, TIBC, IRON, RETICCTPCT in the last 72 hours. Sepsis Labs: No results for input(s): PROCALCITON, LATICACIDVEN in the last 168 hours.  No results found for this or any previous visit (from the past 240 hour(s)).    Radiology Studies: Ct Angio Chest Pe W And/or Wo Contrast  Addendum Date: 03/02/2016   ADDENDUM REPORT: 03/02/2016 20:59 ADDENDUM: There soft tissue densities outlined by contrast in the distal SVC and right atrium potentially representing thrombus as well. Electronically Signed   By: Ashley Royalty M.D.   On: 03/02/2016 20:59   Result Date: 03/02/2016 CLINICAL DATA:  Metastatic esophageal cancer with dyspnea. EXAM: CT ANGIOGRAPHY CHEST WITH CONTRAST TECHNIQUE: Multidetector CT imaging of the chest was performed using the standard protocol during bolus administration of intravenous contrast. Multiplanar CT image reconstructions and MIPs were obtained to evaluate the vascular anatomy. CONTRAST:  80 cc of Isovue 370 IV COMPARISON:  02/09/2016 chest CT FINDINGS: Cardiovascular: Acute bilateral lobar through segmental right upper, lobar through subsegmental right lower and left upper lobar through segmental pulmonary emboli are noted with right heart strain (RV/ LV ratio = 1). Calcified  coronary atherosclerosis with minimal calcified aortic atherosclerosis. Top normal size cardiac chambers without pericardial effusion. Mediastinum/Nodes: Masslike abnormality in the distal esophagus is again noted involving the lower third with single wall thickness up to 2.1 cm unchanged in appearance. Associated posterior and subcarinal mediastinal lymphadenopathy is again identified without significant change. Superimposed mild AP window adenopathy to 12 mm short axis, paraesophageal adenopathy to 14 mm short axis and cluster of subcarinal lymph nodes are again noted slightly more prominent in the subcarinal region with slight increase in number of lymph nodes suggested. Stable left para-aortic lymphadenopathy. Lungs/Pleura: Patchy ground-glass opacities are noted the visualized lung possibly representing hypoventilatory change. There is atelectasis at the right lung base. Upper Abdomen: The GE junction appears unremarkable. Stomach is contracted to the extent visible. 12 mm indeterminate left hepatic hypodensity  is suggested on current exam not apparent on previous. Musculoskeletal: Interval progression of osseous metastatic disease with interval increase in size of several lytic foci in particular the manubrium, right scapula, T2,T4, the T9 and T11 vertebral bodies. Suspicious for epidural involvement of the left at T11. Multiple osteolytic bony destruction of bilateral ribs soft tissue components. Review of the MIP images confirms the above findings. IMPRESSION: Positive for acute PE with CT evidence of right heart strain (RV/LV Ratio = 1) consistent with at least submassive (intermediate risk) PE. The presence of right heart strain has been associated with an increased risk of morbidity and mortality. Please activate Code PE by paging [phone]. Critical Value/emergent results were called by telephone at the time of interpretation on 03/02/2016 at 8:05 pm to Dr. Davonna Belling , who verbally acknowledged  these results. Distal esophageal mass with progression of metastatic disease. Of note, suggestion of epidural involvement at T11 on the left. Electronically Signed: By: Ashley Royalty M.D. On: 03/02/2016 20:05   Nm Bone Scan Whole Body  Result Date: 03/02/2016 CLINICAL DATA:  History of esophageal cancer.  Back and chest pain. EXAM: NUCLEAR MEDICINE WHOLE BODY BONE SCAN TECHNIQUE: Whole body anterior and posterior images were obtained approximately 3 hours after intravenous injection of radiopharmaceutical. RADIOPHARMACEUTICALS:  26.0 mCi Technetium-60m MDP IV COMPARISON:  Abdomen and pelvis CT, 03/02/2016. Chest CT, 02/09/2016. FINDINGS: There multiple areas of abnormal radiotracer localization consistent with metastatic disease to bone, corresponding to lytic lesions noted on the prior CTs. Specifically, there are small foci of uptake in the calvarium. Abnormal uptake is seen in the sternum and involving multiple ribs as well as the right scapula. There is uptake from the spine most prominently from the T10. More mild appearing uptake is noted in the pelvis. There is uptake involving the right mid femur. There also areas of degenerative uptake involving the sternoclavicular joints knees and feet. Renal uptake is symmetric. IMPRESSION: Multiple areas of abnormal uptake consistent with metastatic disease to bone, corresponding to lytic bone lesions noted on the recent chest and abdomen and pelvis CTs. Electronically Signed   By: Lajean Manes M.D.   On: 03/02/2016 16:40   Ct Abdomen Pelvis W Contrast  Result Date: 03/02/2016 CLINICAL DATA:  Patient with esophageal carcinoma. EXAM: CT ABDOMEN AND PELVIS WITH CONTRAST TECHNIQUE: Multidetector CT imaging of the abdomen and pelvis was performed using the standard protocol following bolus administration of intravenous contrast. CONTRAST:  100 cc Isovue-300 COMPARISON:  Chest CT 02/09/2016. FINDINGS: Lower chest: There is masslike thickening of the distal esophagus  (image 4; series 201. Enlarged periaortic lymph nodes about the distal descending thoracic aorta measuring up to 1.4 cm (image 8; series 201). Suggestion of possible filling defect within a right lower lobe pulmonary artery (image 4; series 201). Additionally within the subpleural right lower lobe there is triangular-shaped ground-glass and consolidative opacity (image 15; series 205). Hepatobiliary: Multiple low-attenuation lesions are demonstrated throughout the liver with a reference lesion in the left hepatic lobe measuring 1.2 cm (image 9; series 201) and a reference lesion within the inferior right hepatic lobe measuring 5.0 x 3.3 cm (image 35; series 201). Patient status post cholecystectomy. Pancreas: Within the pancreatic tail there is a 1.5 cm enhancing lesion (image 33; series 201). The remainder the pancreas is unremarkable. Spleen: Unremarkable Adrenals/Urinary Tract: Mild nodularity of the adrenal glands bilaterally. Kidneys enhance symmetrically with contrast. Urinary bladder is unremarkable. Too small to characterize subcentimeter partially exophytic lesion off the interpolar region of the  right kidney (image 32; series 201). Stomach/Bowel: No evidence for bowel obstruction. Stool throughout the colon. No free fluid or free intraperitoneal air. Normal morphology of the stomach. Two adjacent prominent small bowel mesenteric lymph nodes measuring up to 10 mm (image 42; series 2 with 3), potentially reactive. Metastatic lesions not excluded. Vascular/Lymphatic: Normal caliber abdominal aorta. Peripheral calcified atherosclerotic plaque. Retroaortic left renal vein. Reproductive: Prostate is unremarkable. Other: Small bilateral fat containing inguinal hernias. Musculoskeletal: Multiple lytic lesions are demonstrated throughout the visualized ribs, thoracic spine, lumbar spine as well as the pelvis. There is a 4.5 x 2.2 cm lytic lesion with soft tissue mass involving the posterior aspect of the T11  vertebral body which appears to narrow the spinal canal (image 14; series 201). Reference 4.3 cm lytic lesion within the left ilium (image 64; series 201). IMPRESSION: Findings suggestive of embolus within the visualized right lower lobe pulmonary arteries. There is an adjacent triangular-shaped subpleural ground-glass and consolidative opacity within the right lower lobe which is favored represent associated pulmonary infarct. Consider dedicated evaluation with CTA chest. Masslike thickening of the distal esophagus compatible with primary esophageal carcinoma. Multiple enlarged para-aortic lymph nodes compatible with metastatic disease. Additionally there are multiple metastatic lesions involving the liver and visualized skeleton. Note is made of a large lytic lesion with soft tissue mass involving the T11 vertebral body which appears to narrow the spinal canal at this level. Nonspecific enhancing soft tissue mass at the level of the distal pancreas which may represent enhancing primary pancreatic lesion versus splenule. Metastatic disease is considered less likely. Critical Value/emergent results were called by telephone at the time of interpretation on 03/02/2016 at 2:16 pm to Dr. Marin Olp, who verbally acknowledged these results. Electronically Signed   By: Lovey Newcomer M.D.   On: 03/02/2016 14:17   Dg Shoulder Left  Result Date: 03/02/2016 CLINICAL DATA:  Felt pop at left shoulder when getting up. Initial encounter. EXAM: LEFT SHOULDER - 2+ VIEW COMPARISON:  None. FINDINGS: There is no evidence of fracture or dislocation. The left humeral head is seated within the glenoid fossa. The acromioclavicular joint is unremarkable in appearance. No significant soft tissue abnormalities are seen. The visualized portions of the left lung are clear. IMPRESSION: No evidence of fracture or dislocation. Electronically Signed   By: Garald Balding M.D.   On: 03/02/2016 19:55    Scheduled Meds: . feeding supplement  (ENSURE ENLIVE)  237 mL Oral TID BM  . folic acid  1 mg Oral Daily  . LORazepam  0-4 mg Intravenous Q6H   Followed by  . LORazepam  0-4 mg Intravenous Q12H  . metoprolol succinate  25 mg Oral Daily  . morphine  15 mg Oral Q8H  . multivitamin with minerals  1 tablet Oral Daily  . potassium chloride  40 mEq Oral Once  . pravastatin  80 mg Oral Daily  . sodium chloride flush  10-40 mL Intracatheter Q12H  . sodium chloride flush  3 mL Intravenous Q12H  . thiamine  100 mg Oral Daily   Or  . thiamine  100 mg Intravenous Daily  . zolpidem  10 mg Oral QHS   Continuous Infusions: . sodium chloride 75 mL/hr at 03/04/16 0124  . heparin 1,500 Units/hr (03/04/16 0015)     LOS: 1 day    Chipper Oman, MD [facility] Pager [phone]  If 7PM-7AM, please contact night-coverage [email or web address] Password [email or web address] 03/04/2016, 11:14 AM

## 2016-03-04 NOTE — Progress Notes (Signed)
ANTICOAGULATION CONSULT NOTE  Pharmacy Consult for heparin Indication: pulmonary embolus  No Known Allergies  Patient Measurements: Height: 5\' 10"  (177.8 cm) Weight: 222 lb 4.8 oz (100.8 kg) IBW/kg (Calculated) : 73 Heparin Dosing Weight: 94kg  Vital Signs: Temp: 98.4 F (36.9 C) (12/31 0500) Temp Source: Oral (12/31 0500) BP: 117/73 (12/31 0500) Pulse Rate: 88 (12/31 0500)  Labs:  Recent Labs  03/02/16 1832 03/02/16 1900 03/03/16 0348 03/03/16 0753 03/03/16 1816 03/03/16 1817 03/04/16 0551  HGB  --  12.9*  --   --   --   --  11.9*  HCT  --  36.6*  --   --   --   --  34.1*  PLT  --  351  --   --   --   --  285  APTT 29  --  36  --   --   --   --   LABPROT 15.0  --   --   --   --   --   --   INR 1.17  --   --   --   --   --   --   HEPARINUNFRC  --   --   --   --  0.45  --  0.49  CREATININE  --  1.10 0.99  --   --   --  0.93  TROPONINI  --   --  <0.03 0.07*  --  <0.03  --     Estimated Creatinine Clearance: 101.7 mL/min (by C-G formula based on SCr of 0.93 mg/dL).   Medical History: Past Medical History:  Diagnosis Date  . Cancer ([place]) 02/2016   low esophagus cancer, mets   . Depression with anxiety   . HLD (hyperlipidemia)   . Hypertension   . Sleep apnea     Medications:  Infusions:  . sodium chloride 75 mL/hr at 03/04/16 0124  . heparin 1,500 Units/hr (03/04/16 0015)    Assessment: 12 yom presented to the ED with confirmed PE in the setting of malignancy. Initially started on lovenox but now changing to IV heparin. Received a dose of lovenox tonight at ~8pm. Baseline labs WNL and he is not on anticoagulation PTA.   Daily/confirmatory heparin level is therapeutic at 0.49 on heparin 1500 units/hr. Hemoglobin is low/stable and platelet count within normal limits. No issues with bleeding or infusion noted.  Goal of Therapy:  Heparin level 0.3-0.7 units/ml Monitor platelets by anticoagulation protocol: Yes   Plan:  Continue heparin 1500  units/hr Daily heparin level and CBC Monitor for signs/symptoms of bleeding  F/U plans for long term anticoagulation plan  Demetrius Charity, PharmD Acute Care Pharmacy Resident  Pager: [phone] 03/04/2016

## 2016-03-05 ENCOUNTER — Inpatient Hospital Stay (HOSPITAL_COMMUNITY): Payer: BLUE CROSS/BLUE SHIELD

## 2016-03-05 LAB — CBC
HCT: 36 % — ABNORMAL LOW (ref 39.0–52.0)
Hemoglobin: 12.1 g/dL — ABNORMAL LOW (ref 13.0–17.0)
MCH: 30.7 pg (ref 26.0–34.0)
MCHC: 33.6 g/dL (ref 30.0–36.0)
MCV: 91.4 fL (ref 78.0–100.0)
Platelets: 267 10*3/uL (ref 150–400)
RBC: 3.94 MIL/uL — ABNORMAL LOW (ref 4.22–5.81)
RDW: 12.9 % (ref 11.5–15.5)
WBC: 9.6 10*3/uL (ref 4.0–10.5)

## 2016-03-05 LAB — HEPARIN LEVEL (UNFRACTIONATED): HEPARIN UNFRACTIONATED: 0.41 [IU]/mL (ref 0.30–0.70)

## 2016-03-05 LAB — GLUCOSE, CAPILLARY: Glucose-Capillary: 102 mg/dL — ABNORMAL HIGH (ref 65–99)

## 2016-03-05 MED ORDER — POTASSIUM CHLORIDE CRYS ER 20 MEQ PO TBCR
40.0000 meq | EXTENDED_RELEASE_TABLET | Freq: Once | ORAL | Status: AC
  Administered 2016-03-05: 40 meq via ORAL
  Filled 2016-03-05: qty 2

## 2016-03-05 MED ORDER — GADOBENATE DIMEGLUMINE 529 MG/ML IV SOLN
20.0000 mL | Freq: Once | INTRAVENOUS | Status: AC
  Administered 2016-03-05: 20 mL via INTRAVENOUS

## 2016-03-05 MED ORDER — RIVAROXABAN 15 MG PO TABS
15.0000 mg | ORAL_TABLET | Freq: Two times a day (BID) | ORAL | Status: DC
  Administered 2016-03-05 – 2016-03-06 (×3): 15 mg via ORAL
  Filled 2016-03-05 (×3): qty 1

## 2016-03-05 NOTE — Progress Notes (Signed)
COURTESY NOTE: 61 y/o [place] man with a new diagnosis ofstage IV esophageal adenocarcinoma, currently day 5 cycle 1 FOLFOX cheemotherapy, admitted 03/02/2016 with back pain. He remains ambulatory. Dr Burr Medico his primary oncologist was concerned re possible cord compression and spinal MRI was obtained showing "tumor in the posterior aspect of the vertebral body on the left side which extends into the expanded left pedicle and into the left neural foramen, left lateral spinal canal, lamina and transverse process. Epidural tumor is displacing the cord slightly to the right." This likely explains his left hip/ leg pain.  While there is no impending cord compression he will likely benefit from palliative  XRT to this area. Dr Burr Medico will see the patient 03/06/2016 and will help coordinate further treatment.  Please let me know if we can be of further help at this point

## 2016-03-05 NOTE — Evaluation (Signed)
Physical Therapy Evaluation Patient Details Name: ADMIR CAPUCHINO MRN: [id number] DOB: [birth year] Today's Date: 03/05/2016   History of Present Illness  46M with acute PE. PMH significant for stage IV esophageal cancer, HTN, HLD, anxiety, and OSA (unable to tolerate CPAP).   Clinical Impression  Pt admitted with above diagnosis. Pt currently with functional limitations due to the deficits listed below (see PT Problem List). Pt was able to ambulate without device with overall good stability.  May benefit from cane to ease pain on left hip.  Pt informed of possible cane use at home.  Pt did mention that his left shoulder was sore and cannot raise it above 90 degrees.  Paged MD to recommend OT to assess left shoulder with possible need for Outpt PT or OT.  Will follow acutely to ensure mobility.  Pt will benefit from skilled PT to increase their independence and safety with mobility to allow discharge to the venue listed below.      Follow Up Recommendations No PT follow up;Supervision - Intermittent    Equipment Recommendations  Cane (pt may get a cane at drug store)    Recommendations for Other Services       Precautions / Restrictions Precautions Precautions: Fall Restrictions Weight Bearing Restrictions: No      Mobility  Bed Mobility Overal bed mobility: Needs Assistance Bed Mobility: Supine to Sit     Supine to sit: Supervision        Transfers Overall transfer level: Needs assistance Equipment used: None Transfers: Sit to/from Stand Sit to Stand: Supervision            Ambulation/Gait Ambulation/Gait assistance: Min guard Ambulation Distance (Feet): 300 Feet Assistive device: None Gait Pattern/deviations: Step-through pattern;Decreased stride length;Drifts right/left   Gait velocity interpretation: <1.8 ft/sec, indicative of risk for recurrent falls General Gait Details: Pt was able to ambulate without device in hallway with good balance overall.  Pt can withstand  min challenges to balance as well.  Occasional drifting with ambulationdue to left hip pain.  informed pt a cane may help him with pain and slight balance disturbance.    Stairs Stairs: Yes Stairs assistance: Supervision Stair Management: No rails;Step to pattern;Forwards Number of Stairs: 4 General stair comments: Pt was able to ascend and descend steps without assist or cues.   Wheelchair Mobility    Modified Rankin (Stroke Patients Only)       Balance Overall balance assessment: Needs assistance;History of Falls Sitting-balance support: No upper extremity supported;Feet supported Sitting balance-Leahy Scale: Good     Standing balance support: No upper extremity supported;During functional activity Standing balance-Leahy Scale: Fair Standing balance comment: can stand statically and maintain balance.                  Standardized Balance Assessment Standardized Balance Assessment : Dynamic Gait Index   Dynamic Gait Index Level Surface: Normal Change in Gait Speed: Normal Gait with Horizontal Head Turns: Normal Gait with Vertical Head Turns: Normal Gait and Pivot Turn: Mild Impairment Step Over Obstacle: Mild Impairment Step Around Obstacles: Normal Steps: Normal Total Score: 22       Pertinent Vitals/Pain Pain Assessment: 0-10 Pain Score: 3  Pain Location: left hip Pain Descriptors / Indicators: Aching;Sore Pain Intervention(s): Limited activity within patient's tolerance;Monitored during session;Repositioned  Sats 91% and greater on RA.  95% at rest on RA.   Home Living Family/patient expects to be discharged to:: Private residence Living Arrangements: Alone;Children Available Help at Discharge: Family;Available 24 hours/day (family  will assist prn) Type of Home: House Home Access: Stairs to enter Entrance Stairs-Rails: None Entrance Stairs-Number of Steps: 3 Home Layout: One level Home Equipment: Shower seat      Prior Function Level of  Independence: Independent         Comments: Had been moving slower since chemo but was independent     Hand Dominance        Extremity/Trunk Assessment   Upper Extremity Assessment Upper Extremity Assessment: Defer to OT evaluation    Lower Extremity Assessment Lower Extremity Assessment: Overall WFL for tasks assessed    Cervical / Trunk Assessment Cervical / Trunk Assessment: Normal  Communication   Communication: No difficulties  Cognition Arousal/Alertness: Awake/alert Behavior During Therapy: WFL for tasks assessed/performed Overall Cognitive Status: Within Functional Limits for tasks assessed                      General Comments General comments (skin integrity, edema, etc.): Scored 22/24 on DGI suggesting low risk of falls.      Exercises     Assessment/Plan    PT Assessment Patient needs continued PT services  PT Problem List Decreased activity tolerance;Decreased balance;Decreased mobility;Decreased safety awareness;Decreased knowledge of precautions;Pain          PT Treatment Interventions DME instruction;Gait training;Balance training;Patient/family education    PT Goals (Current goals can be found in the Care Plan section)  Acute Rehab PT Goals Patient Stated Goal: to go home PT Goal Formulation: With patient Time For Goal Achievement: 03/19/16 Potential to Achieve Goals: Good    Frequency Min 3X/week   Barriers to discharge        Co-evaluation               End of Session Equipment Utilized During Treatment: Gait belt Activity Tolerance: Patient limited by fatigue Patient left: in chair;with call bell/phone within reach;with family/visitor present Nurse Communication: Mobility status         Time: 1120-1145 PT Time Calculation (min) (ACUTE ONLY): 25 min   Charges:   PT Evaluation $PT Eval Moderate Complexity: 1 Procedure PT Treatments $Gait Training: 8-22 mins   PT G Codes:        Denice Paradise 03/30/16,  1:26 PM [place],PT Acute Rehabilitation [phone] (pager)

## 2016-03-05 NOTE — Progress Notes (Signed)
[facility] for Xarelto Indication: pulmonary embolus  No Known Allergies  Patient Measurements: Height: 5\' 10"  (177.8 cm) Weight: 221 lb 1.6 oz (100.3 kg) IBW/kg (Calculated) : 73 Heparin Dosing Weight: 94kg  Vital Signs: Temp: 98.9 F (37.2 C) (01/01 0515) Temp Source: Oral (01/01 0515) BP: 141/91 (01/01 0515) Pulse Rate: 98 (01/01 0515)  Labs:  Recent Labs  03/02/16 1832  03/02/16 1900 03/03/16 0348 03/03/16 0753 03/03/16 1816 03/03/16 1817 03/04/16 0551 03/05/16 0510  HGB  --   < > 12.9*  --   --   --   --  11.9* 12.1*  HCT  --   --  36.6*  --   --   --   --  34.1* 36.0*  PLT  --   --  351  --   --   --   --  285 267  APTT 29  --   --  36  --   --   --   --   --   LABPROT 15.0  --   --   --   --   --   --   --   --   INR 1.17  --   --   --   --   --   --   --   --   HEPARINUNFRC  --   --   --   --   --  0.45  --  0.49 0.41  CREATININE  --   --  1.10 0.99  --   --   --  0.93  --   TROPONINI  --   --   --  <0.03 0.07*  --  <0.03  --   --   < > = values in this interval not displayed.  Estimated Creatinine Clearance: 101.5 mL/min (by C-G formula based on SCr of 0.93 mg/dL).   Assessment: 61yo M with submassive PE started on heparin gtt, pharmacy has been consulted to transition to [facility]. CBC stable, no s/sx of bleeding documented   Plan:  D/c heparin gtt Xarelto 15 mg PO BID for 21 days, then 20 mg once daily starting on 1/22 Monitor CBC, s/sx of bleeding    Gwenlyn Perking, PharmD PGY1 Pharmacy Resident Pager: [phone] 03/05/2016 11:58 AM

## 2016-03-05 NOTE — Progress Notes (Signed)
PT note Pt was able to ambulate in hallway on RA with sats 91% and greater.  Pt overall with steady gait with occasional drifting due to left hip pain.  Pt may benefit from picking up a cane to use prn at home and pt and son aware.  Pt mentioned his left UE and its' limitations therefore paged MD for OT consult to address left UE.  Most likely, OT will recommend Outpatient therapy for left UE ROM/strengthening.  No further needs at this time.  Full note to follow.  Thanks.  [place] [phone] (pager)

## 2016-03-05 NOTE — Progress Notes (Signed)
ANTICOAGULATION CONSULT NOTE  Pharmacy Consult for heparin Indication: pulmonary embolus  No Known Allergies  Patient Measurements: Height: 5\' 10"  (177.8 cm) Weight: 221 lb 1.6 oz (100.3 kg) IBW/kg (Calculated) : 73 Heparin Dosing Weight: 94kg  Vital Signs: Temp: 98.9 F (37.2 C) (01/01 0515) Temp Source: Oral (01/01 0515) BP: 141/91 (01/01 0515) Pulse Rate: 98 (01/01 0515)  Labs:  Recent Labs  03/02/16 1832  03/02/16 1900 03/03/16 0348 03/03/16 0753 03/03/16 1816 03/03/16 1817 03/04/16 0551 03/05/16 0510  HGB  --   < > 12.9*  --   --   --   --  11.9* 12.1*  HCT  --   --  36.6*  --   --   --   --  34.1* 36.0*  PLT  --   --  351  --   --   --   --  285 267  APTT 29  --   --  36  --   --   --   --   --   LABPROT 15.0  --   --   --   --   --   --   --   --   INR 1.17  --   --   --   --   --   --   --   --   HEPARINUNFRC  --   --   --   --   --  0.45  --  0.49 0.41  CREATININE  --   --  1.10 0.99  --   --   --  0.93  --   TROPONINI  --   --   --  <0.03 0.07*  --  <0.03  --   --   < > = values in this interval not displayed.  Estimated Creatinine Clearance: 101.5 mL/min (by C-G formula based on SCr of 0.93 mg/dL).   Assessment: 61yo M with submassive PE to continue on heparin gtt. Heparin level therapeutic at 0.41, CBC stable, no s/sx of bleeding documented  Goal of Therapy:  Heparin level 0.3-0.7 units/ml Monitor platelets by anticoagulation protocol: Yes   Plan:  Continue heparin at 1500 units/hr Daily heparin level and CBC Monitor for s/sx of bleeding  F/u plans for long term anticoagulation   Gwenlyn Perking, PharmD PGY1 Pharmacy Resident Pager: [phone] 03/05/2016 9:25 AM

## 2016-03-05 NOTE — Progress Notes (Signed)
PROGRESS NOTE [facility]   Walter Craig   [id number] DOB: 1955/07/26  DOA: 03/02/2016 PCP: [name], MD   Brief Narrative:  Walter Craig is a 61 y.o. male with medical history significant of hypertension, hyperlipidemia, anxiety, OSA not on CPAP, alcohol drinking, newly diagnosed metastasized stage for esophageal cancer, who presents with back pain.  Patient states he has been having back pain recently. It involves both lower and upper back. It is constant, 8 out of 10 in severity, nonradiating. He also has intermittent bilateral shoulder blade pain. His PCP ordered CT abdomen/pelvis today, which showed pulmonary embolism. Pt denies cough, shortness of breath and chest pain. No fever, chills, nausea, vomiting, diarrhea, abdominal pain, symptoms of UTI or unilateral weakness.  Subjective: Patient seen and examined at bedside, off oxygen, denies chest pain and SOB. Pain in shoulder better    Assessment & Plan: PE - submassive PE with evidence of right heart strain by CTA. Etiology likely to be history of cancer and chemotherapy port (clot on superior vena cava). Dr. 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 On chemotherapy infusions - last one was supposed to be today Bone scan shows multiple site metastasis Follow-up with oncology as an outpatient Patient have a lytic lesion on T11 with possible canal narrowing - discussed with oncology - will get an MRI   Hypertension - stable Continue metoprolol Monitor BP  Shoulder pain - Left - strain from mechanical fall Alternate Ice and heat  Xray shows no fractures or dislocation  Pain med PRN  PT eval   Hypokalemia Replete BMP in am  DVT prophylaxis: Heparin drip  Code Status: Full  Family Communication: Family  at bedside  Disposition Plan: If stable will d/c home tomorrow   Consultants:   None   Procedures:   ECHO   Doppler   Antimicrobials:  None   Objective: Vitals:   03/04/16 1500 03/04/16 2133 03/05/16 0515 03/05/16 1431  BP: 121/75 114/69 (!) 141/91 113/83  Pulse: (!) 102 (!) 104 98 (!) 108  Resp: 18 (!) 21    Temp: 100 F (37.8 C) 99.5 F (37.5 C) 98.9 F (37.2 C) 99.9 F (37.7 C)  TempSrc: Oral Oral Oral Oral  SpO2: 94% 95% 94% 95%  Weight:   100.3 kg (221 lb 1.6 oz)   Height:        Intake/Output Summary (Last 24 hours) at 03/05/16 1502 Last data filed at 03/05/16 0600  Gross per 24 hour  Intake              732 ml  Output              625 ml  Net              107 ml   Filed Weights   03/02/16 1731 03/02/16 2239 03/05/16 0515  Weight: 100.5 kg (221 lb 8 oz) 100.8 kg (222 lb 4.8 oz) 100.3 kg (221 lb 1.6 oz)    Examination: - No changes on exam from 03/04/16  General exam: NAD  Respiratory system: CTA B/l no wheezing  Cardiovascular system: S1 & S2 heard, RRR.  Extremities: Left shoulder tenderness, Full ROM  Data Reviewed: I have personally reviewed following labs and imaging studies  CBC:  Recent Labs Lab 02/29/16 0942 03/02/16  1900 03/04/16 0551 03/05/16 0510  WBC 16.1* 17.5* 8.4 9.6  NEUTROABS 13.6*  --   --   --   HGB 15.1 12.9* 11.9* 12.1*  HCT 43.1 36.6* 34.1* 36.0*  MCV 89.1 87.4 88.6 91.4  PLT 316 351 285 [id number]   Basic Metabolic Panel:  Recent Labs Lab 02/29/16 0942 03/02/16 1900 03/03/16 0348 03/04/16 0551  NA 133* 133* 135 135  K 3.2* 3.2* 3.8 3.4*  CL  --  93* 95* 99*  CO2 29 28 29 29   GLUCOSE 118 178* 138* 103*  BUN 22.6 26* 20 16  CREATININE 1.3 1.10 0.99 0.93  CALCIUM 9.8 8.9 8.6* 8.3*  MG  --   --  2.4  --    GFR: Estimated Creatinine Clearance: 101.5 mL/min (by C-G formula based on SCr of 0.93 mg/dL). Liver Function Tests:  Recent Labs Lab 02/29/16 0942  AST 48*  ALT 41  ALKPHOS 123  BILITOT 0.85  PROT  7.9  ALBUMIN 3.4*   No results for input(s): LIPASE, AMYLASE in the last 168 hours. No results for input(s): AMMONIA in the last 168 hours. Coagulation Profile:  Recent Labs Lab 03/02/16 1832  INR 1.17   Cardiac Enzymes:  Recent Labs Lab 03/03/16 0348 03/03/16 0753 03/03/16 1817  TROPONINI <0.03 0.07* <0.03   BNP (last 3 results) No results for input(s): PROBNP in the last 8760 hours. HbA1C: No results for input(s): HGBA1C in the last 72 hours. CBG:  Recent Labs Lab 03/05/16 0730  GLUCAP 102*   Lipid Profile: No results for input(s): CHOL, HDL, LDLCALC, TRIG, CHOLHDL, LDLDIRECT in the last 72 hours. Thyroid Function Tests: No results for input(s): TSH, T4TOTAL, FREET4, T3FREE, THYROIDAB in the last 72 hours. Anemia Panel: No results for input(s): VITAMINB12, FOLATE, FERRITIN, TIBC, IRON, RETICCTPCT in the last 72 hours. Sepsis Labs: No results for input(s): PROCALCITON, LATICACIDVEN in the last 168 hours.  No results found for this or any previous visit (from the past 240 hour(s)).    Radiology Studies: No results found.  Scheduled Meds: . feeding supplement (ENSURE ENLIVE)  237 mL Oral TID BM  . folic acid  1 mg Oral Daily  . LORazepam  0-4 mg Intravenous Q12H  . metoprolol succinate  25 mg Oral Daily  . morphine  15 mg Oral Q8H  . multivitamin with minerals  1 tablet Oral Daily  . pravastatin  80 mg Oral Daily  . Rivaroxaban  15 mg Oral BID  . sodium chloride flush  10-40 mL Intracatheter Q12H  . sodium chloride flush  3 mL Intravenous Q12H  . thiamine  100 mg Oral Daily   Or  . thiamine  100 mg Intravenous Daily  . zolpidem  10 mg Oral QHS   Continuous Infusions:    LOS: 2 days    Chipper Oman, MD [facility] Pager [phone]  If 7PM-7AM, please contact night-coverage [email or web address] Password [email or web address] 03/05/2016, 3:02 PM

## 2016-03-05 NOTE — Progress Notes (Signed)
Name: Walter Craig MRN: [id number] DOB: Oct 27, 1955    ADMISSION DATE:  03/02/2016 CONSULTATION DATE:  03/02/16  REFERRING MD :  EDP  CHIEF COMPLAINT:  Back pain  BRIEF PATIENT DESCRIPTION:  41M with acute PE. PMH significant for stage IV esophageal cancer, HTN, HLD, anxiety, and OSA (unable to tolerate CPAP).   SIGNIFICANT EVENTS   STUDIES:  CTA chest:  Acute bilateral lobar through segmental right upper, lobar through subsegmental right lower and left upper lobar through segmental pulmonary emboli.  LE duplex 12/30 > prelim neg > Echo 12/30 > EF 60-65%.  Normal RV  SUBJECTIVE:  No complaints.  On heparin and tolerating well. Has not needed supplemental O2 at all, SpO2 96%.  Denies any chest pain, SOB.  Is eager to go home.  VITAL SIGNS: Temp:  [98.9 F (37.2 C)-100 F (37.8 C)] 98.9 F (37.2 C) (01/01 0515) Pulse Rate:  [98-104] 98 (01/01 0515) Resp:  [18-21] 21 (12/31 2133) BP: (114-141)/(69-91) 141/91 (01/01 0515) SpO2:  [94 %-95 %] 94 % (01/01 0515) Weight:  [221 lb 1.6 oz (100.3 kg)] 221 lb 1.6 oz (100.3 kg) (01/01 0515)  PHYSICAL EXAMINATION: General Well nourished, well developed, no apparent distress. Visiting with family.  HEENT No gross abnormalities. Oropharynx clear. Mallampati IV. Good dentition.  Pulmonary Clear to auscultation bilaterally with no wheezes, rales or ronchi. Good effort, symmetrical expansion.   Cardiovascular Normal rate, regular rhythm. S1, s2. No m/r/g. Distal pulses palpable.  Abdomen Soft, non-tender, non-distended, positive bowel sounds, no palpable organomegaly or masses. Normoresonant to percussion.  Musculoskeletal Grossly normal.   Lymphatics No cervical, supraclavicular or axillary adenopathy.   Neurologic Grossly intact. No focal deficits.   Skin/Integuement No rash, no cyanosis, no clubbing. No edema.       Recent Labs Lab 03/02/16 1900 03/03/16 0348 03/04/16 0551  NA 133* 135 135  K 3.2* 3.8 3.4*  CL 93* 95* 99*  CO2  28 29 29   BUN 26* 20 16  CREATININE 1.10 0.99 0.93  GLUCOSE 178* 138* 103*    Recent Labs Lab 03/02/16 1900 03/04/16 0551 03/05/16 0510  HGB 12.9* 11.9* 12.1*  HCT 36.6* 34.1* 36.0*  WBC 17.5* 8.4 9.6  PLT 351 285 267   No results found.  ASSESSMENT / PLAN:  Mr. Borchard is a 41M with bilateral PE and hx esophageal CA stage IV with mets to bone. He has not had any evidence of hemodynamic compromise and is saturating well on 2L O2. TTE normal, trop flat.   Did not meet submassive criteria clinically, so did not pursue lytics (1/2 dose or catheter directed). Would favor enoxaparin for anticoagulation in the setting of underlying malignancy, but choice of anticoagulant should be made in conjunction with his oncologist.    Continue systemic anticoagulation, will need transition from heparin to long term agent  F/u official LE duplex results  Walk for desat prior to discharge  Nothing further at this point  F/u with oncology  Rest per primary team.  Nothing further to add, PCCM will sign off.  Please do not hesitate to call back if we can be of further assistance.   Montey Hora, [facility] Pager: [phone]  or [phone] 03/05/2016, 11:08 AM  Attending Note:  I have examined patient, reviewed labs, studies and notes. I have discussed the case with Junius Roads, and I agree with the data and plans as amended above.  Herbie Baltimore  Lamonte Sakai, MD, PhD 03/06/2016, 8:26 PM [facility] [phone] or if no answer [phone]

## 2016-03-06 ENCOUNTER — Telehealth: Payer: Self-pay | Admitting: *Deleted

## 2016-03-06 DIAGNOSIS — C7951 Secondary malignant neoplasm of bone: Secondary | ICD-10-CM

## 2016-03-06 LAB — BASIC METABOLIC PANEL
Anion gap: 11 (ref 5–15)
BUN: 14 mg/dL (ref 6–20)
CHLORIDE: 97 mmol/L — AB (ref 101–111)
CO2: 24 mmol/L (ref 22–32)
CREATININE: 0.89 mg/dL (ref 0.61–1.24)
Calcium: 8.8 mg/dL — ABNORMAL LOW (ref 8.9–10.3)
GFR calc Af Amer: >60 mL/min (ref >60)
GFR calc non Af Amer: >60 mL/min (ref >60)
GLUCOSE: 105 mg/dL — AB (ref 65–99)
Potassium: 3.7 mmol/L (ref 3.5–5.1)
SODIUM: 132 mmol/L — AB (ref 135–145)

## 2016-03-06 LAB — CBC
HCT: 34.7 % — ABNORMAL LOW (ref 39.0–52.0)
HEMOGLOBIN: 12.1 g/dL — AB (ref 13.0–17.0)
MCH: 30.9 pg (ref 26.0–34.0)
MCHC: 34.9 g/dL (ref 30.0–36.0)
MCV: 88.5 fL (ref 78.0–100.0)
Platelets: 168 10*3/uL (ref 150–400)
RBC: 3.92 MIL/uL — ABNORMAL LOW (ref 4.22–5.81)
RDW: 12.6 % (ref 11.5–15.5)
WBC: 10 10*3/uL (ref 4.0–10.5)

## 2016-03-06 LAB — GLUCOSE, CAPILLARY: GLUCOSE-CAPILLARY: 105 mg/dL — AB (ref 65–99)

## 2016-03-06 MED ORDER — HEPARIN SOD (PORK) LOCK FLUSH 100 UNIT/ML IV SOLN
500.0000 [IU] | INTRAVENOUS | Status: AC | PRN
  Administered 2016-03-06: 500 [IU]

## 2016-03-06 MED ORDER — RIVAROXABAN (XARELTO) VTE STARTER PACK (15 & 20 MG): ORAL_TABLET | ORAL | 0 refills | Status: DC

## 2016-03-06 MED FILL — MORPHINE SULF 100 MG/5 ML S: 100 | 10 days supply | Qty: 30 | Fill #0

## 2016-03-06 NOTE — Progress Notes (Signed)
[facility]   Patient seen and examined with daughter at bedside. Patient doing well without oxygen. Started yesterday on Xarelto - risk and benefits discussed including the fact that xarelto does not have a reverasble agent. Patient verbalizes understanding. MRI was done per Dr. Burr Medico request results were discussed with patient and daughter  Patient will be discharge home with xarelto - follow up with Dr. Burr Medico, patient may need radiotherapy.  Patient was instructed to rest for 1 week then resume regular activities as tolerated.   Discharge summary to follow   Chipper Oman, MD

## 2016-03-06 NOTE — Discharge Summary (Signed)
Physician Discharge Summary  Walter Craig  [id number]  DOB: 01/26/1956  DOA: 03/02/2016 PCP: Walter Pac, MD  Admit date: 03/02/2016 Discharge date: 03/06/2016  Admitted From: Home  Disposition:  Home  Recommendations for Outpatient Follow-up:  1. Follow up with PCP in 1-2 weeks 2. Please obtain BMP/CBC in one week  Equipment/Devices: Cane  Discharge Condition: Improved  Prognosis: Fair CODE STATUS: FULL Diet recommendation: Heart Healthy - Soft   Brief/Interim Summary: Walter Craig is a 61 y.o M with PMHx of HTN, HLD, anxiety, OSA and recently diagnosed with esophageal Ca with bone mets presented was sent to the ED after was found to have PE on CT abd/pelvis that was ordered due to progressive back pain. CTA done in the ED submassive PE with evidence of R heart strain. Patient was admitted for IV anticoagulation, O2 supplementation and further monitoring. Patient had an uneventful hospital stay. Patient was transitioned to oral anticoagulation and was discharge home with Oncology follow up.   Discharge Diagnoses/Hospital course:  PE - submassive PE with evidence of right heart strain by CTA. Provoke history of cancer and chemotherapy port (clot on superior vena cava).  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 On chemotherapy infusion per Heme/Onc  Bone scan shows multiple site metastasis Patient have a lytic lesion on T11 with possible canal narrowing MRI of thoracic spine was done - lesion on metastasis to T11 and T12 - Radio/onc consulted -  candidate for RXT His primary oncologist arranging for further treatment and plan   Hypertension - stable Continue metoprolol Monitor BP  Shoulder pain - Left - strain from mechanical fall Alternate Ice and heat  Xray shows no fractures or dislocation  Pain med PRN  PT/OT eval no further recommendations   Hypokalemia - resolved  Check BMP in 1 week   Discharge Instructions  Discharge Instructions    Call MD for:  difficulty breathing, headache or visual disturbances    Complete by:  As directed    Call MD for:  extreme fatigue    Complete by:  As directed    Call MD for:  hives    Complete by:  As directed    Call MD for:  persistant dizziness or light-headedness    Complete by:  As directed    Call MD for:  persistant nausea and vomiting    Complete by:  As directed    Call MD for:  redness, tenderness, or signs of infection (pain, swelling, redness, odor or green/yellow discharge around incision site)    Complete by:  As directed    Call MD for:  severe uncontrolled pain    Complete by:  As directed    Call MD for:  temperature >100.4    Complete by:  As directed    Diet - low sodium heart healthy    Complete by:  As directed    Increase activity slowly    Complete by:  As directed      Allergies as of 03/06/2016   No Known Allergies  Medication List    TAKE these medications   ALPRAZolam 0.5 MG tablet Commonly known as:  XANAX Take 0.5 mg by mouth at bedtime as needed for sleep.   cyclobenzaprine 5 MG tablet Commonly known as:  FLEXERIL Take 1-2 tablets (5-10 mg total) by mouth 3 (three) times daily as needed for muscle spasms.   diclofenac 75 MG EC tablet Commonly known as:  VOLTAREN Take 75 mg by mouth 2 (two) times daily as needed (pain/inflammation).   fluticasone 50 MCG/ACT nasal spray Commonly known as:  FLONASE Place 1 spray into both nostrils daily as needed (congestion).   hydrochlorothiazide 25 MG tablet Commonly known as:   HYDRODIURIL Take 25 mg by mouth daily.   lidocaine-prilocaine cream Commonly known as:  EMLA Apply to affected area once What changed:  how much to take  how to take this  when to take this  reasons to take this  additional instructions   metoprolol succinate 25 MG 24 hr tablet Commonly known as:  TOPROL-XL Take 25 mg by mouth daily.   morphine 15 MG 12 hr tablet Commonly known as:  MS CONTIN Take 1 tablet (15 mg total) by mouth every 8 (eight) hours.   morphine CONCENTRATE 10 MG/0.5ML Soln concentrated solution Take 0.25-0.5 mLs (5-10 mg total) by mouth every 4 (four) hours as needed for severe pain.   ondansetron 8 MG tablet Commonly known as:  ZOFRAN Take 1 tablet (8 mg total) by mouth 2 (two) times daily as needed for refractory nausea / vomiting. Start on day 3 after chemotherapy.   pravastatin 80 MG tablet Commonly known as:  PRAVACHOL Take 80 mg by mouth daily.   prochlorperazine 10 MG tablet Commonly known as:  COMPAZINE Take 1 tablet (10 mg total) by mouth every 6 (six) hours as needed (Nausea or vomiting).   Rivaroxaban 15 & 20 MG Tbpk Take as directed on package: Start with one 15mg  tablet by mouth twice a day with food. On Day 22, switch to one 20mg  tablet once a day with food.   zolpidem 10 MG tablet Commonly known as:  AMBIEN Take 10 mg by mouth at bedtime.      Follow-up Information    Walter Pac, MD. Schedule an appointment as soon as possible for a visit in 1 week(s).   Specialty:  Family Medicine Contact information: [place] [address] [phone]        Walter Merle, MD. Schedule an appointment as soon as possible for a visit in 1 week(s).   Specialties:  Hematology, Oncology Contact information: [place] [address] [phone]          No Known Allergies  Consultations:  [facility] Oncology - Walter Craig   Procedures/Studies: Ct Chest W Contrast  Addendum Date: 02/09/2016   ADDENDUM  REPORT: 02/09/2016 15:42 ADDENDUM: Study discussed by telephone with Walter Craig on 02/09/2016 At 1528 hours. Electronically Signed   By: Walter Ann M.D.   On: 02/09/2016 15:42   Result Date: 02/09/2016 CLINICAL DATA:  61 year old male with chest wall pain, she bilateral shoulder blade pain, low back pain. Symptoms for 3-4 weeks. Former smoker. Initial encounter. EXAM: CT CHEST WITH CONTRAST TECHNIQUE: Multidetector CT imaging of the chest was performed during intravenous contrast administration. CONTRAST:  28mL ISOVUE-300 IOPAMIDOL (ISOVUE-300) INJECTION 61% COMPARISON:  Chest radiographs 01/05/2016. FINDINGS: Cardiovascular: Calcified coronary artery atherosclerosis. Minimal calcified aortic atherosclerosis. No pericardial effusion. Mediastinum/Nodes: Bulky and eccentric soft tissue thickening in the  lower third of the thoracic esophagus which appears irregular and masslike. See sagittal image 100. Soft tissue thickening up to 2 cm. Associated posterior and subcarinal mediastinal lymphadenopathy, with increased size and number of lymph nodes. Nodes individually up to 13-16 mm short axis. Superimposed mild AP window and moderate right peritracheal adenopathy (right peritracheal node up to 15 mm). Lungs/Pleura: Major airways are patent. Areas of curvilinear subpleural and perihilar lung scarring mostly on the left. Two small 3-4 mm indeterminate left lung nodules (series 5, image 78 and image 91). No pleural effusion. Small probable intrapleural lymph node at the confluence of the right major and minor fissures (series 5, image 79 and coronal image 90). Other subtle nodularity along the pleural fissures is indeterminate. Upper Abdomen: The gastroesophageal junction appears normal. Negative visible stomach and other bowel in the upper abdomen. Mild retrocrural lymphadenopathy. Surgically absent gallbladder. Negative visible liver, spleen, pancreas, adrenal glands and kidneys. Musculoskeletal: Osseous metastatic  disease. Destructive 3 x 5 cm lesion of the posterior right fifth rib (series 2, image 48). Lytic lesion of the posterior right seventh rib. Nearby mildly displaced right posterior lateral seventh rib fracture might be pathologic (series 5, image 77). 2.5 x 3.5 lytic metastasis anterior left third rib. Smaller lytic metastasis with pathologic fracture posterior left fifth rib (series 2, image 59). Mildly displaced probable pathologic fracture posterior lateral left third rib (image 43). Scattered vertebral metastases including multiple small lytic vertebral body metastases in the thoracic spine (e.g. T4 series 4, image 106). Larger lytic left T11 metastasis (series 2, image 125) without strong evidence of epidural extension. Similar lytic posterior element metastasis right L1 vertebra (series 2, image 154). IMPRESSION: 1. Constellation of abnormal posterior mediastinum and lytic osseous metastatic disease most compatible with stage IV esophageal carcinoma. 2. Distal third thoracic esophagus masslike soft tissue thickening and irregularity up to 2 cm. See sagittal image 100. Recommend EGD. 3. Multiple destructive rib metastases with pathologic fractures. Multiple thoracic and upper lumbar vertebral metastases, though none with suspected epidural extension at this time. Electronically Signed: By: Walter Ann M.D. On: 02/09/2016 15:20   Ct Angio Chest Pe W And/or Wo Contrast  Addendum Date: 03/02/2016   ADDENDUM REPORT: 03/02/2016 20:59 ADDENDUM: There soft tissue densities outlined by contrast in the distal SVC and right atrium potentially representing thrombus as well. Electronically Signed   By: Ashley Royalty M.D.   On: 03/02/2016 20:59   Result Date: 03/02/2016 CLINICAL DATA:  Metastatic esophageal cancer with dyspnea. EXAM: CT ANGIOGRAPHY CHEST WITH CONTRAST TECHNIQUE: Multidetector CT imaging of the chest was performed using the standard protocol during bolus administration of intravenous contrast. Multiplanar  CT image reconstructions and MIPs were obtained to evaluate the vascular anatomy. CONTRAST:  80 cc of Isovue 370 IV COMPARISON:  02/09/2016 chest CT FINDINGS: Cardiovascular: Acute bilateral lobar through segmental right upper, lobar through subsegmental right lower and left upper lobar through segmental pulmonary emboli are noted with right heart strain (RV/ LV ratio = 1). Calcified coronary atherosclerosis with minimal calcified aortic atherosclerosis. Top normal size cardiac chambers without pericardial effusion. Mediastinum/Nodes: Masslike abnormality in the distal esophagus is again noted involving the lower third with single wall thickness up to 2.1 cm unchanged in appearance. Associated posterior and subcarinal mediastinal lymphadenopathy is again identified without significant change. Superimposed mild AP window adenopathy to 12 mm short axis, paraesophageal adenopathy to 14 mm short axis and cluster of subcarinal lymph nodes are again noted slightly more prominent in the subcarinal region with slight  increase in number of lymph nodes suggested. Stable left para-aortic lymphadenopathy. Lungs/Pleura: Patchy ground-glass opacities are noted the visualized lung possibly representing hypoventilatory change. There is atelectasis at the right lung base. Upper Abdomen: The GE junction appears unremarkable. Stomach is contracted to the extent visible. 12 mm indeterminate left hepatic hypodensity is suggested on current exam not apparent on previous. Musculoskeletal: Interval progression of osseous metastatic disease with interval increase in size of several lytic foci in particular the manubrium, right scapula, T2,T4, the T9 and T11 vertebral bodies. Suspicious for epidural involvement of the left at T11. Multiple osteolytic bony destruction of bilateral ribs soft tissue components. Review of the MIP images confirms the above findings. IMPRESSION: Positive for acute PE with CT evidence of right heart strain (RV/LV  Ratio = 1) consistent with at least submassive (intermediate risk) PE. The presence of right heart strain has been associated with an increased risk of morbidity and mortality. Please activate Code PE by paging [phone]. Critical Value/emergent results were called by telephone at the time of interpretation on 03/02/2016 at 8:05 pm to Walter. Davonna Belling , who verbally acknowledged these results. Distal esophageal mass with progression of metastatic disease. Of note, suggestion of epidural involvement at T11 on the left. Electronically Signed: By: Ashley Royalty M.D. On: 03/02/2016 20:05   Mr Thoracic Spine W Wo Contrast  Result Date: 03/05/2016 CLINICAL DATA:  Metastatic esophageal cancer. EXAM: MRI THORACIC SPINE WITH CONTRAST TECHNIQUE: Multiplanar, multisequence MR imaging of the thoracic spine was performed following the administration of intravenous contrast. COMPARISON:  Chest CT 03/02/2016 FINDINGS: Alignment:  Normal Vertebrae: Diffuse osseous metastatic disease involving the cervical, thoracic and upper lumbar spine. Numerous enhancing lesions throughout the spine. Cord:  No cord lesions or abnormal cord enhancement.  No syrinx. Paraspinal and other soft tissues: Extensive mediastinal disease is noted along with a distal esophageal mass. There is a large lesion associated with the fifth right rib with a large soft tissue component. Disc levels: The most significant thoracic disease is located at T11 on the left side. There is tumor in the posterior aspect of the vertebral body on the left side which extends into the expanded left pedicle and into the left neural foramen, left lateral spinal canal, lamina and transverse process. Epidural tumor is displacing the cord slightly to the right. There is also left-sided T12 tumor extending out through the lateral cortex of vertebral body and areas tumor in the right pedicle of L1. Multilevel shallow disc protrusions are noted. I do not see any other areas of  spinal canal indication by tumor. IMPRESSION: 1. Diffuse osseous metastatic disease involving the cervical spine, thoracic spine and upper lumbar spine. 2. No cord lesions. 3. The most significant thoracic metastatic disease is located at T11 on the left side with there is extensive tumor in the posterior aspect of the 2 body, in the left pedicle can left lamina and transverse process. There is also tumor bulging slightly into the left neural foramen at T11-12. There is epidural tumor on left side and there is mild mass effect on the left side of the thecal sac. Electronically Signed   By: Marijo Sanes M.D.   On: 03/05/2016 19:42   Nm Bone Scan Whole Body  Result Date: 03/02/2016 CLINICAL DATA:  History of esophageal cancer.  Back and chest pain. EXAM: NUCLEAR MEDICINE WHOLE BODY BONE SCAN TECHNIQUE: Whole body anterior and posterior images were obtained approximately 3 hours after intravenous injection of radiopharmaceutical. RADIOPHARMACEUTICALS:  26.0 mCi Technetium-57m  MDP IV COMPARISON:  Abdomen and pelvis CT, 03/02/2016. Chest CT, 02/09/2016. FINDINGS: There multiple areas of abnormal radiotracer localization consistent with metastatic disease to bone, corresponding to lytic lesions noted on the prior CTs. Specifically, there are small foci of uptake in the calvarium. Abnormal uptake is seen in the sternum and involving multiple ribs as well as the right scapula. There is uptake from the spine most prominently from the T10. More mild appearing uptake is noted in the pelvis. There is uptake involving the right mid femur. There also areas of degenerative uptake involving the sternoclavicular joints knees and feet. Renal uptake is symmetric. IMPRESSION: Multiple areas of abnormal uptake consistent with metastatic disease to bone, corresponding to lytic bone lesions noted on the recent chest and abdomen and pelvis CTs. Electronically Signed   By: Lajean Manes M.D.   On: 03/02/2016 16:40   Ct Abdomen  Pelvis W Contrast  Result Date: 03/02/2016 CLINICAL DATA:  Patient with esophageal carcinoma. EXAM: CT ABDOMEN AND PELVIS WITH CONTRAST TECHNIQUE: Multidetector CT imaging of the abdomen and pelvis was performed using the standard protocol following bolus administration of intravenous contrast. CONTRAST:  100 cc Isovue-300 COMPARISON:  Chest CT 02/09/2016. FINDINGS: Lower chest: There is masslike thickening of the distal esophagus (image 4; series 201. Enlarged periaortic lymph nodes about the distal descending thoracic aorta measuring up to 1.4 cm (image 8; series 201). Suggestion of possible filling defect within a right lower lobe pulmonary artery (image 4; series 201). Additionally within the subpleural right lower lobe there is triangular-shaped ground-glass and consolidative opacity (image 15; series 205). Hepatobiliary: Multiple low-attenuation lesions are demonstrated throughout the liver with a reference lesion in the left hepatic lobe measuring 1.2 cm (image 9; series 201) and a reference lesion within the inferior right hepatic lobe measuring 5.0 x 3.3 cm (image 35; series 201). Patient status post cholecystectomy. Pancreas: Within the pancreatic tail there is a 1.5 cm enhancing lesion (image 33; series 201). The remainder the pancreas is unremarkable. Spleen: Unremarkable Adrenals/Urinary Tract: Mild nodularity of the adrenal glands bilaterally. Kidneys enhance symmetrically with contrast. Urinary bladder is unremarkable. Too small to characterize subcentimeter partially exophytic lesion off the interpolar region of the right kidney (image 32; series 201). Stomach/Bowel: No evidence for bowel obstruction. Stool throughout the colon. No free fluid or free intraperitoneal air. Normal morphology of the stomach. Two adjacent prominent small bowel mesenteric lymph nodes measuring up to 10 mm (image 42; series 2 with 3), potentially reactive. Metastatic lesions not excluded. Vascular/Lymphatic: Normal  caliber abdominal aorta. Peripheral calcified atherosclerotic plaque. Retroaortic left renal vein. Reproductive: Prostate is unremarkable. Other: Small bilateral fat containing inguinal hernias. Musculoskeletal: Multiple lytic lesions are demonstrated throughout the visualized ribs, thoracic spine, lumbar spine as well as the pelvis. There is a 4.5 x 2.2 cm lytic lesion with soft tissue mass involving the posterior aspect of the T11 vertebral body which appears to narrow the spinal canal (image 14; series 201). Reference 4.3 cm lytic lesion within the left ilium (image 64; series 201). IMPRESSION: Findings suggestive of embolus within the visualized right lower lobe pulmonary arteries. There is an adjacent triangular-shaped subpleural ground-glass and consolidative opacity within the right lower lobe which is favored represent associated pulmonary infarct. Consider dedicated evaluation with CTA chest. Masslike thickening of the distal esophagus compatible with primary esophageal carcinoma. Multiple enlarged para-aortic lymph nodes compatible with metastatic disease. Additionally there are multiple metastatic lesions involving the liver and visualized skeleton. Note is made of a large lytic  lesion with soft tissue mass involving the T11 vertebral body which appears to narrow the spinal canal at this level. Nonspecific enhancing soft tissue mass at the level of the distal pancreas which may represent enhancing primary pancreatic lesion versus splenule. Metastatic disease is considered less likely. Critical Value/emergent results were called by telephone at the time of interpretation on 03/02/2016 at 2:16 pm to Walter. Marin Olp, who verbally acknowledged these results. Electronically Signed   By: Lovey Newcomer M.D.   On: 03/02/2016 14:17   Ir US Guide Vasc Access Right  Result Date: 02/24/2016 CLINICAL DATA:  Metastatic esophageal cancer EXAM: RIGHT INTERNAL JUGULAR SINGLE LUMEN POWER PORT CATHETER INSERTION Date:   12/22/201712/22/2017 2:30 pm Radiologist:  M. Daryll Brod, MD Guidance:  Ultrasound and fluoroscopic MEDICATIONS: 2 g Ancef; The antibiotic was administered within an appropriate time interval prior to skin puncture. ANESTHESIA/SEDATION: Versed 4.0 mg IV; Fentanyl 100 mcg IV; Moderate Sedation Time:  27 minutes The patient was continuously monitored during the procedure by the interventional radiology nurse under my direct supervision. FLUOROSCOPY TIME:  36 seconds (16 mGy) COMPLICATIONS: None immediate. CONTRAST:  None. PROCEDURE: Informed consent was obtained from the patient following explanation of the procedure, risks, benefits and alternatives. The patient understands, agrees and consents for the procedure. All questions were addressed. A time out was performed. Maximal barrier sterile technique utilized including caps, mask, sterile gowns, sterile gloves, large sterile drape, hand hygiene, and 2% chlorhexidine scrub. Under sterile conditions and local anesthesia, right internal jugular micropuncture venous access was performed. Access was performed with ultrasound. Images were obtained for documentation. A guide wire was inserted followed by a transitional dilator. This allowed insertion of a guide wire and catheter into the IVC. Measurements were obtained from the SVC / RA junction back to the right IJ venotomy site. In the right infraclavicular chest, a subcutaneous pocket was created over the second anterior rib. This was done under sterile conditions and local anesthesia. 1% lidocaine with epinephrine was utilized for this. A 2.5 cm incision was made in the skin. Blunt dissection was performed to create a subcutaneous pocket over the right pectoralis major muscle. The pocket was flushed with saline vigorously. There was adequate hemostasis. The port catheter was assembled and checked for leakage. The port catheter was secured in the pocket with two retention sutures. The tubing was tunneled  subcutaneously to the right venotomy site and inserted into the SVC/RA junction through a valved peel-away sheath. Position was confirmed with fluoroscopy. Images were obtained for documentation. The patient tolerated the procedure well. No immediate complications. Incisions were closed in a two layer fashion with 4 - 0 Vicryl suture. Dermabond was applied to the skin. The port catheter was accessed, blood was aspirated followed by saline and heparin flushes. Needle was removed. A dry sterile dressing was applied. IMPRESSION: Ultrasound and fluoroscopically guided right internal jugular single lumen power port catheter insertion. Tip in the SVC/RA junction. Catheter ready for use. Electronically Signed   By: Jerilynn Mages.  Shick M.D.   On: 02/24/2016 14:34   Dg Shoulder Left  Result Date: 03/02/2016 CLINICAL DATA:  Felt pop at left shoulder when getting up. Initial encounter. EXAM: LEFT SHOULDER - 2+ VIEW COMPARISON:  None. FINDINGS: There is no evidence of fracture or dislocation. The left humeral head is seated within the glenoid fossa. The acromioclavicular joint is unremarkable in appearance. No significant soft tissue abnormalities are seen. The visualized portions of the left lung are clear. IMPRESSION: No evidence of fracture or dislocation.  Electronically Signed   By: Garald Balding M.D.   On: 03/02/2016 19:55   Ir Fluoro Guide Port Insertion Right  Result Date: 02/24/2016 CLINICAL DATA:  Metastatic esophageal cancer EXAM: RIGHT INTERNAL JUGULAR SINGLE LUMEN POWER PORT CATHETER INSERTION Date:  12/22/201712/22/2017 2:30 pm Radiologist:  M. Daryll Brod, MD Guidance:  Ultrasound and fluoroscopic MEDICATIONS: 2 g Ancef; The antibiotic was administered within an appropriate time interval prior to skin puncture. ANESTHESIA/SEDATION: Versed 4.0 mg IV; Fentanyl 100 mcg IV; Moderate Sedation Time:  27 minutes The patient was continuously monitored during the procedure by the interventional radiology nurse under my  direct supervision. FLUOROSCOPY TIME:  36 seconds (16 mGy) COMPLICATIONS: None immediate. CONTRAST:  None. PROCEDURE: Informed consent was obtained from the patient following explanation of the procedure, risks, benefits and alternatives. The patient understands, agrees and consents for the procedure. All questions were addressed. A time out was performed. Maximal barrier sterile technique utilized including caps, mask, sterile gowns, sterile gloves, large sterile drape, hand hygiene, and 2% chlorhexidine scrub. Under sterile conditions and local anesthesia, right internal jugular micropuncture venous access was performed. Access was performed with ultrasound. Images were obtained for documentation. A guide wire was inserted followed by a transitional dilator. This allowed insertion of a guide wire and catheter into the IVC. Measurements were obtained from the SVC / RA junction back to the right IJ venotomy site. In the right infraclavicular chest, a subcutaneous pocket was created over the second anterior rib. This was done under sterile conditions and local anesthesia. 1% lidocaine with epinephrine was utilized for this. A 2.5 cm incision was made in the skin. Blunt dissection was performed to create a subcutaneous pocket over the right pectoralis major muscle. The pocket was flushed with saline vigorously. There was adequate hemostasis. The port catheter was assembled and checked for leakage. The port catheter was secured in the pocket with two retention sutures. The tubing was tunneled subcutaneously to the right venotomy site and inserted into the SVC/RA junction through a valved peel-away sheath. Position was confirmed with fluoroscopy. Images were obtained for documentation. The patient tolerated the procedure well. No immediate complications. Incisions were closed in a two layer fashion with 4 - 0 Vicryl suture. Dermabond was applied to the skin. The port catheter was accessed, blood was aspirated followed  by saline and heparin flushes. Needle was removed. A dry sterile dressing was applied. IMPRESSION: Ultrasound and fluoroscopically guided right internal jugular single lumen power port catheter insertion. Tip in the SVC/RA junction. Catheter ready for use. Electronically Signed   By: Jerilynn Mages.  Shick M.D.   On: 02/24/2016 14:34   ECHO  ------------------------------------------------------------------- Study Conclusions  - Left ventricle: The cavity size was normal. Wall thickness was   increased in a pattern of mild LVH. Systolic function was normal.   The estimated ejection fraction was in the range of 60% to 65%.   Discharge Exam: Vitals:   03/05/16 2052 03/06/16 0500  BP: 131/76 122/75  Pulse: (!) 115 (!) 103  Resp:    Temp: 99.1 F (37.3 C) 99.3 F (37.4 C)   Vitals:   03/05/16 0515 03/05/16 1431 03/05/16 2052 03/06/16 0500  BP: (!) 141/91 113/83 131/76 122/75  Pulse: 98 (!) 108 (!) 115 (!) 103  Resp:      Temp: 98.9 F (37.2 C) 99.9 F (37.7 C) 99.1 F (37.3 C) 99.3 F (37.4 C)  TempSrc: Oral Oral Oral Oral  SpO2: 94% 95% 93% 93%  Weight: 100.3 kg (221 lb  1.6 oz)   99.2 kg (218 lb 11.2 oz)  Height:        General: Pt is alert, awake, not in acute distress Cardiovascular: RRR, S1/S2 +, no rubs, no gallops Respiratory: CTA bilaterally, no wheezing, no rhonchi Abdominal: Soft, NT, ND, bowel sounds + Extremities: no edema, no cyanosis    The results of significant diagnostics from this hospitalization (including imaging, microbiology, ancillary and laboratory) are listed below for reference.     Microbiology: No results found for this or any previous visit (from the past 240 hour(s)).   Labs: BNP (last 3 results)  Recent Labs  03/03/16 0348  BNP 0000000   Basic Metabolic Panel:  Recent Labs Lab 02/29/16 0942 03/02/16 1900 03/03/16 0348 03/04/16 0551 03/06/16 0550  NA 133* 133* 135 135 132*  K 3.2* 3.2* 3.8 3.4* 3.7  CL  --  93* 95* 99* 97*  CO2 29 28 29  29 24   GLUCOSE 118 178* 138* 103* 105*  BUN 22.6 26* 20 16 14   CREATININE 1.3 1.10 0.99 0.93 0.89  CALCIUM 9.8 8.9 8.6* 8.3* 8.8*  MG  --   --  2.4  --   --    Liver Function Tests:  Recent Labs Lab 02/29/16 0942  AST 48*  ALT 41  ALKPHOS 123  BILITOT 0.85  PROT 7.9  ALBUMIN 3.4*   No results for input(s): LIPASE, AMYLASE in the last 168 hours. No results for input(s): AMMONIA in the last 168 hours. CBC:  Recent Labs Lab 02/29/16 0942 03/02/16 1900 03/04/16 0551 03/05/16 0510 03/06/16 0550  WBC 16.1* 17.5* 8.4 9.6 10.0  NEUTROABS 13.6*  --   --   --   --   HGB 15.1 12.9* 11.9* 12.1* 12.1*  HCT 43.1 36.6* 34.1* 36.0* 34.7*  MCV 89.1 87.4 88.6 91.4 88.5  PLT 316 351 285 267 168   Cardiac Enzymes:  Recent Labs Lab 03/03/16 0348 03/03/16 0753 03/03/16 1817  TROPONINI <0.03 0.07* <0.03   BNP: Invalid input(s): POCBNP CBG:  Recent Labs Lab 03/05/16 0730 03/06/16 0738  GLUCAP 102* 105*   D-Dimer No results for input(s): DDIMER in the last 72 hours. Hgb A1c No results for input(s): HGBA1C in the last 72 hours. Lipid Profile No results for input(s): CHOL, HDL, LDLCALC, TRIG, CHOLHDL, LDLDIRECT in the last 72 hours. Thyroid function studies No results for input(s): TSH, T4TOTAL, T3FREE, THYROIDAB in the last 72 hours.  Invalid input(s): FREET3 Anemia work up No results for input(s): VITAMINB12, FOLATE, FERRITIN, TIBC, IRON, RETICCTPCT in the last 72 hours. Urinalysis    Component Value Date/Time   COLORURINE YELLOW 03/02/2016 0809   APPEARANCEUR CLEAR 03/02/2016 0809   LABSPEC 1.030 03/02/2016 0809   PHURINE 7.0 03/02/2016 0809   GLUCOSEU 50 (A) 03/02/2016 0809   HGBUR NEGATIVE 03/02/2016 0809   BILIRUBINUR NEGATIVE 03/02/2016 0809   KETONESUR NEGATIVE 03/02/2016 0809   PROTEINUR 30 (A) 03/02/2016 0809   NITRITE NEGATIVE 03/02/2016 0809   LEUKOCYTESUR NEGATIVE 03/02/2016 0809   Sepsis Labs Invalid input(s): PROCALCITONIN,  WBC,   LACTICIDVEN Microbiology No results found for this or any previous visit (from the past 240 hour(s)).   Time coordinating discharge: Over 30 minutes  SIGNED:  Chipper Oman, MD  [facility] 03/06/2016, 12:35 PM Pager   If 7PM-7AM, please contact night-coverage [email or web address] Password [email or web address]

## 2016-03-06 NOTE — Telephone Encounter (Signed)
Call from daughter asking for results from imaging studies and when will they be seeing Dr. Burr Medico again? She thought he was going to be seen this week. Forwarded message to collaborative nurse to f/u with Dr. Burr Medico.

## 2016-03-06 NOTE — Discharge Instructions (Signed)
Information on my medicine - XARELTO (rivaroxaban)  This medication education was reviewed with me or my healthcare representative as part of my discharge preparation.  The pharmacist that spoke with me during my hospital stay was:  Jaquita Folds, [place]? Xarelto was prescribed to treat blood clots that may have been found in the veins of your legs (deep vein thrombosis) or in your lungs (pulmonary embolism) and to reduce the risk of them occurring again.  What do you need to know about Xarelto? The starting dose is one 15 mg tablet taken TWICE daily with food for the FIRST 21 DAYS then on  03/26/16  the dose is changed to one 20 mg tablet taken ONCE A DAY with your evening meal.  DO NOT stop taking Xarelto without talking to the health care provider who prescribed the medication.  Refill your prescription for 20 mg tablets before you run out.  After discharge, you should have regular check-up appointments with your healthcare provider that is prescribing your Xarelto.  In the future your dose may need to be changed if your kidney function changes by a significant amount.  What do you do if you miss a dose? If you are taking Xarelto TWICE DAILY and you miss a dose, take it as soon as you remember. You may take two 15 mg tablets (total 30 mg) at the same time then resume your regularly scheduled 15 mg twice daily the next day.  If you are taking Xarelto ONCE DAILY and you miss a dose, take it as soon as you remember on the same day then continue your regularly scheduled once daily regimen the next day. Do not take two doses of Xarelto at the same time.   Important Safety Information Xarelto is a blood thinner medicine that can cause bleeding. You should call your healthcare provider right away if you experience any of the following: ? Bleeding from an injury or your nose that does not stop. ? Unusual colored urine (red or dark brown) or unusual colored  stools (red or black). ? Unusual bruising for unknown reasons. ? A serious fall or if you hit your head (even if there is no bleeding).  Some medicines may interact with Xarelto and might increase your risk of bleeding while on Xarelto. To help avoid this, consult your healthcare provider or pharmacist prior to using any new prescription or non-prescription medications, including herbals, vitamins, non-steroidal anti-inflammatory drugs (NSAIDs) and supplements.  This website has more information on Xarelto: [email or web address]

## 2016-03-06 NOTE — Progress Notes (Signed)
Pt discharged home. Discharge instructions have been gone over with the patient. IV's removed. Pt given unit number and told to call if they have any concerns regarding their discharge instructions. Follow up appt made. Awaiting IV team to deaccess port

## 2016-03-06 NOTE — Evaluation (Addendum)
Occupational Therapy Evaluation  Patient Details Name: Walter Craig MRN: [id number] DOB: Aug 21, 1955 Today's Date: 03/06/2016    History of Present Illness 85M with acute PE. PMH significant for stage IV esophageal cancer, HTN, HLD, anxiety, and OSA (unable to tolerate CPAP).    Clinical Impression   This 61 yo male admitted with above presents to acute OT today with c/o left shoulder pain that happened as he was getting off of the bone scan table (that did not have any attachments to help him get up and no one attempted to help him get up per his report). He reports that he tried to use his abdominals and brought his left shoulder forward for momentum to get off of the table when he heard his left shoulder pop. He reports that it has been getting progressively better each day since it happened. I gave him some exercises to do and to follow up with his PCP if he feels like it isn't getting better. We will see for 1 more session to make sure he is doing and understanding his exercises.    Follow Up Recommendations  No OT follow up    Equipment Recommendations  None recommended by OT       Precautions / Restrictions Precautions Precautions: Fall Restrictions Weight Bearing Restrictions: No      Mobility Bed Mobility               General bed mobility comments: Pt up in recliner upon arrival  Transfers Overall transfer level: Independent Equipment used: None Transfers: Sit to/from Stand Sit to Stand: Independent                   ADL                                         General ADL Comments: Educated pt on most efficient method of getting dressed while generally protecting his LUE from full movements for getting dressed. Also educated him on not doing any WB'ing with it (pulling, pushing, lifting)               Pertinent Vitals/Pain Pain Assessment: 0-10 Pain Score: 2  Pain Location: left shoulder post exercise and with attempt to move  it in forward and lateral planes; at rest not really aware of aching unless brought to attention Pain Descriptors / Indicators: Aching;Sore Pain Intervention(s): Limited activity within patient's tolerance;Monitored during session;Repositioned;Heat applied     Hand Dominance Right   Extremity/Trunk Assessment Upper Extremity Assessment Upper Extremity Assessment: LUE deficits/detail LUE Deficits / Details: Pt able to get AROM of shoulder to ~90 degrees flexion and ~70 degrees abduction; AAROM (walk walking) pt able to get ~80 degrees for both. All movements (AROM/AAROM) require increased effort. LUE Coordination: decreased gross motor           Communication Communication Communication: No difficulties   Cognition Arousal/Alertness: Awake/alert Behavior During Therapy: WFL for tasks assessed/performed Overall Cognitive Status: Within Functional Limits for tasks assessed                        Exercises   Other Exercises Other Exercises: Educated pt on wall walking exercises (forward flexion and abduction), in sitting forward flexion with thumb up and elbow straight to 90 degrees, 45 degrees of external rotation with thumb up and elbow straight then up to 90  degrees (pt unable to do full abduction up to 90 degrees with thumb up and elbow straight), isometric exercises for internal/and external rotation (5 reps of each several times a day building up to 10 reps). Educated on use of heat before exercises and ice as he feels it helps him and to just generally try to use his LUE without loading it. I asked him if he would like me to write the exercises down he said he did not need them, that he understood what to do.   Shoulder Instructions      Home Living Family/patient expects to be discharged to:: Private residence Living Arrangements: Alone;Children Available Help at Discharge: Family;Available 24 hours/day (family can A prn) Type of Home: House Home Access: Stairs to  enter [employer] of Steps: 3 Entrance Stairs-Rails: None Home Layout: One level     Bathroom Shower/Tub: Occupational psychologist: Standard     Home Equipment: Shower seat          Prior Functioning/Environment Level of Independence: Independent        Comments: Had been moving slower since chemo but was independent        OT Problem List: Decreased strength;Impaired UE functional use   OT Treatment/Interventions:      OT Goals(Current goals can be found in the care plan section) Acute Rehab OT Goals Patient Stated Goal: to go home and get shoulder better  OT Frequency:                End of Session Equipment Utilized During Treatment:  (none)  Activity Tolerance: Patient tolerated treatment well Patient left: in chair;with call bell/phone within reach   Time: [id number] OT Time Calculation (min): 17 min Charges:  OT General Charges $OT Visit: 1 Procedure OT Evaluation $OT Eval Low Complexity: 1 Procedure  Almon Register [id number] 03/06/2016, 10:03 AM

## 2016-03-06 NOTE — Care Management Note (Addendum)
Case Management Note  Patient Details  Name: ADEMIDE MOREAU MRN: [id number] Date of Birth: [birth year]  Subjective/Objective:  Pt presented for PE. Plan for d/c on Xarelto. Benefits check completed. Pt is aware of cost. CM did provide pt with 30 day free card for Xarelto. Pt will need Rx for starter pack.                   Action/Plan: [facility] uses [facility] [address]. CM did call to make sure medication is available and it is not available in the starter pack. CM did call the [facility] and starter pack is available. CM in search of a year supply free card at this time.    [facility] [facility] [place] RX # [phone]   XARELTO 20 MG DAILY 30 / 60 TAB   COVER- YES  CO-PAY- $ 45.00  TIER- 3 DRUG  PRIOR APPROVAL- NO  PHARMACY : [facility]   Expected Discharge Date:                  Expected Discharge Plan:  Home/Self Care  In-House Referral:  NA  Discharge planning Services  CM Consult, Medication Assistance  Post Acute Care Choice:  NA Choice offered to:  NA  DME Arranged:  N/A DME Agency:  NA  HH Arranged:  NA HH Agency:  NA  Status of Service:  Completed, signed off  If discussed at [employer] of Stay Meetings, dates discussed:    Additional Comments:  Bethena Roys, RN 03/06/2016, 12:49 PM

## 2016-03-06 NOTE — Progress Notes (Signed)
Oncology brief note   I have reviewed his MRI report and images. OK to discharge if the primary team feels clinically appropriate to discharge, and I will see him in my clinic this week. If he remains to be in house by late today, I will stop by to see him after my clinic.   Walter Craig  03/06/2016 8:32 AM

## 2016-03-07 ENCOUNTER — Encounter: Payer: Self-pay | Admitting: Radiation Oncology

## 2016-03-07 ENCOUNTER — Encounter: Payer: Self-pay | Admitting: *Deleted

## 2016-03-07 ENCOUNTER — Encounter: Payer: Self-pay | Admitting: Hematology

## 2016-03-07 ENCOUNTER — Ambulatory Visit (HOSPITAL_BASED_OUTPATIENT_CLINIC_OR_DEPARTMENT_OTHER): Payer: BLUE CROSS/BLUE SHIELD | Admitting: Hematology

## 2016-03-07 VITALS — BP 121/99 | HR 118 | Temp 98.0°F | Resp 18 | Ht 70.0 in | Wt 215.9 lb

## 2016-03-07 DIAGNOSIS — G893 Neoplasm related pain (acute) (chronic): Secondary | ICD-10-CM

## 2016-03-07 DIAGNOSIS — C159 Malignant neoplasm of esophagus, unspecified: Secondary | ICD-10-CM

## 2016-03-07 DIAGNOSIS — C7951 Secondary malignant neoplasm of bone: Secondary | ICD-10-CM

## 2016-03-07 DIAGNOSIS — C155 Malignant neoplasm of lower third of esophagus: Secondary | ICD-10-CM | POA: Diagnosis not present

## 2016-03-07 DIAGNOSIS — I10 Essential (primary) hypertension: Secondary | ICD-10-CM

## 2016-03-07 DIAGNOSIS — Z7189 Other specified counseling: Secondary | ICD-10-CM

## 2016-03-07 DIAGNOSIS — I2699 Other pulmonary embolism without acute cor pulmonale: Secondary | ICD-10-CM

## 2016-03-07 MED ORDER — CYCLOBENZAPRINE HCL 5 MG PO TABS: 5.0000 mg | ORAL_TABLET | Freq: Three times a day (TID) | ORAL | 0 refills | Status: DC | PRN

## 2016-03-07 MED FILL — CYCLOBENZAPRINE 5 MG TABLET: 5 | 10 days supply | Qty: 60 | Fill #0

## 2016-03-07 NOTE — Progress Notes (Signed)
Oncology Nurse Navigator Documentation  Oncology Nurse Navigator Flowsheets 03/07/2016  Navigator Location [facility]  Referral date to RadOnc/MedOnc -  Navigator Encounter Type Other--post GI Conference discussion  Telephone -  Abnormal Finding Date -  Confirmed Diagnosis Date -  Treatment Initiated Date -  Patient Visit Type -  Treatment Phase -  Barriers/Navigation Needs Coordination of Care--appointment with Dr. Lisbeth Renshaw  Education -  Interventions Coordination of Care--message to rad onc for appointment asap  Coordination of Care Appts--will see Dr. Lisbeth Renshaw on 1/4 at 0730. Printed appointment for him when he sees Dr. Burr Medico today.  Education Method -  Support Groups/Services -  Acuity -  Time Spent with Patient 15

## 2016-03-07 NOTE — Progress Notes (Signed)
Histology and Location of Primary Cancer: T-11 lytic lesion   Sites of Visceral and Bony Metastatic Disease:   Location(s) of Symptomatic Metastases:   Past/Anticipated chemotherapy by medical oncology, if any:   Pain on a scale of 0-10 is:    If Spine Met(s), symptoms, if any, include:  Bowel/Bladder retention or incontinence (please describe):   Numbness or weakness in extremities (please describe):   Current Decadron regimen, if applicable:   Ambulatory status? Walker? Wheelchair?:   SAFETY ISSUES:  Prior radiation?   Pacemaker/ICD?   Possible current pregnancy?   Is the patient on methotrexate?   Current Complaints / other details:

## 2016-03-07 NOTE — Progress Notes (Signed)
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 Burr Medico, currently on FOLFOX  03/01/16, 1st cycle chemotherapy regimen,  Zometa 02/22/16,  referral for palliative XRT   Concerned possible cord compression Pain on a scale of 0-10 is: back pain ,Morphine'10mg'$   q 4 hours prn    If Spine Met(s), symptoms, if any, include:  Bowel/Bladder retention or incontinence (please describe): He reports constipation. He has been taking milk of magnesia the past few days, and feels like he may have a bowel movement soon. He report a slower stream with urination, and needs to stand there longer.   Numbness or weakness in extremities (please describe): loss 9 lbs  Past week. He reports some instability in his legs, which he feels is related to moving differently because of pain.   Current Decadron regimen, if applicable: No  Ambulatory status? Walker? Wheelchair?: He is ambulatory.   SAFETY ISSUES:  Prior radiation? NO  Pacemaker/ICD? NO  Is the patient on methotrexate? NO  Current Complaints / other details: Married,  Depression/anxiety newly dx esophageal cancer stage IV; PE,HTN, sloop apnea,  quit smoking cigarettes  12 years ago, smokes marijuana, drinks alcohol,   tolerating soft diet, ensure  2 bottles day,  Father- Colon cancer,  and sister colon  cancer, brother gallbladder  cancer deceased recently   He is asking about obtaining a handicap sticker for his car.    Scheduled CT Simulation today at 9am  BP (!) 122/95   Pulse (!) 136   Temp 98.1 F (36.7 C) Comment: axillary  Ht '5\' 10"'$  (1.778 m)   Wt 215 lb 9.6 oz (97.8 kg)   SpO2 97% Comment: room air  BMI 30.94 kg/m    Wt Readings from Last 3 Encounters:  03/08/16 215 lb 9.6 oz (97.8 kg)  03/07/16 215 lb 14.4 oz (97.9 kg)  03/06/16 218  lb 11.2 oz (99.2 kg)

## 2016-03-07 NOTE — Progress Notes (Signed)
[place]  [phone] Fax:([phone]  Clinic follow Up Note   Patient Care Team: Hulan Fess, MD as PCP - General (Family Medicine) Wonda Horner, MD as Consulting Physician (Gastroenterology) Truitt Merle, MD as Consulting Physician (Hematology) 03/07/2016     Esophageal cancer, stage IV ([place])   02/09/2016 Imaging    CT chest with contrast showed constellation of abnormal posterior mediastinum and lytic osseous metastatic disease most compatible with stage IV esophageal cancer, distal thoracic esophagus masslike soft tissue thickening and irregularity up to 2 cm. Multiple destructive rib metastasis with pathological fractures. Multiple thoracic and upper lumbar vertebral metastasis, without suspected epidural extension.      02/15/2016 Procedure     esophageal mucosal change consistent with long segment Barrett's esophagus, a large malignant mass was found in the distal esophagus, partially obstructing and 3/4 circumferential.      02/15/2016 Initial Biopsy    Distal esophageal mass biopsy showed adenocarcinoma, arising in the background of intestinal metaplasia associate with high-grade granular dysplasia. Positive for lymphovascular invasion.      02/15/2016 Initial Diagnosis    Esophageal cancer, stage IV ([place])      03/01/2016 -  Chemotherapy    FOLFOX every 2 weeks        03/02/2016 Imaging    CT and her chest showed acute pulmonary embolism in bilateral lobar through segmental right upper, right low and left upper pulmonary arteries with CT evidence of right heart strain, consistent with at least submassive PE      03/05/2016 Imaging    MR Thoracic spine with/without contrast 1. Diffuse osseous metastatic disease involving the cervical spine, thoracic spine and upper lumber spine. 2. No cord lesions. 3. The most significant thoracic metastatic disease is located at T11 on the left side where there is extensive tumor in the posterior aspect  of the 2 body, in the left pedicle can left lamina and transverse process. There is also tumor bulging slightly into the left neural foramen at T11-12. There is epidural tumor on left side and there is mild mass effect on the left side of the thecal sac.       CHIEF COMPLAINTS:  Follow up metastatic esophageal cancer   HISTORY OF PRESENTING ILLNESS (02/21/2016):  RIGO LETTS 61 y.o. male is here because of His recently diagnosed metastatic esophageal cancer. He is accompanied by his sister-in-law to my clinic today. His daughter was on the phone during the clinic visit also.  He presented with bilateral chest and back pain in early 2023-01-17, after his brother's death. He was seen by his PCP. He had CXR which was negative and he took NSAIDs for the pain. He was referred to PT, bu this pain did not improve. A CT scan was subsequently obtained, which showed distal surgery. Thoracic esophagus masslike soft tissue thickening and irregularity up to his 2 cm, multiple mediastinal and osseous metastasis compatible with stage IV esophageal cancer. He was referred to S retinologist Dr. Penelope Coop, underwent EGD on 02/15/2016 which showed esophageal mucosal change consistent with long segment Barrett's esophagus, a large malignant mass was found in the distal esophagus, partially obstructing and 3/4 circumferential. Biopsies showed invasive adenocarcinoma.  He denies any dysphagia, he recently developed mid chest pain when he eats lately, better with soft and liquid diet. Pain 9/10, he takes hydrocordone 2 tab a day, not well controlled. He still works, he is a Chiropractor, desk job   He had not had BM for 8  days, probably secondary to his pain medication.  He has good appetite, but eats less due to dysphagea, he lost about 20 lbs in the past few months   He has three chidren, his daughter lives in [place], 2 sons living in [place]. His brother Inocente Salles recently died from clinical carcinoma, and Sam's  wife is currently receiving Keytruda for her cholangiocarcinoma.  CURRENT THERAPY: pending first line chemo FOLFOX   INTERIM HISTORY: Mr. Murch returns for follow up. He is accompanied by his daughter today. The patient was admitted to the hospital on 03/02/2016 when blood clots were found on already scheduled imaging. He was released from the hospital yesterday. He did not experience severe shortness of breath though his O2 level was low. He did not have shortness of breath with exertion. He does admit he hasn't been very active since November. He is now taking oral blood thinner Xarelto. He is not having any trouble with this medication. He did well with chemotherapy treatment. He denies nausea. 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   He reports eating has become more difficult. He feels as though after 3 to 4 bites he has swallowed air and feels full. This morning he had cream of rice which went down well. He has been drinking Boost/Ensure. He has been eating [place] stew. He had a fruit tray at the hospital yesterday which he did okay with. He is able to keep food down but it is more difficult than the last time he was in our clinic.   He believes it is better to keep his pain under control since beginning the liquid morphine. The back spasm medication has been helping - he is not sure how much he has left of this medication. He seems to have been sleeping fairly well. He denies numbness or tingling of the arms. If he moves the wrong way his back hurts.   MEDICAL HISTORY:  Past Medical History:  Diagnosis Date  . Anxiety   . Bone cancer ([place])    t=11 lytic lesion  . Cancer ([place]) 02/2016   low esophagus cancer, mets   . Depression   . Depression with anxiety   . Esophageal cancer ([place]) 02/24/2016   invasive adenocarcinoma  . HLD (hyperlipidemia)   . Hypertension   . Pulmonary embolism ([place])   . Sleep apnea     SURGICAL HISTORY: Past Surgical History:    Procedure Laterality Date  . CHOLECYSTECTOMY    . IR GENERIC HISTORICAL  02/24/2016   IR US GUIDE VASC ACCESS RIGHT 02/24/2016 Greggory Keen, MD [facility]  . IR GENERIC HISTORICAL  02/24/2016   IR FLUORO GUIDE PORT INSERTION RIGHT 02/24/2016 Greggory Keen, MD [facility]  . left knee meniscus repare       SOCIAL HISTORY: Social History   Social History  . Marital status: Married    Spouse name: N/A  . Number of children: N/A  . Years of education: N/A   Occupational History  . Not on file.   Social History Main Topics  . Smoking status: Former Smoker    Packs/day: 1.50    Years: 30.00    Quit date: 03/05/2004  . Smokeless tobacco: Never Used  . Alcohol use 1.8 - 2.4 oz/week    2 - 3 Cans of beer, 1 Shots of liquor per week     Comment: daily   . Drug use:     Types: Marijuana  .  Sexual activity: Not on file   Other Topics Concern  . 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 Cancer Father     colon cancer   . Cancer Sister     colon cancer   . Cancer Brother 81    gallbladder cancer     ALLERGIES:  has No Known Allergies.  MEDICATIONS:  Current Outpatient Prescriptions  Medication Sig Dispense Refill  . ALPRAZolam (XANAX) 0.5 MG tablet Take 0.5 mg by mouth at bedtime as needed for sleep.    . cyclobenzaprine (FLEXERIL) 5 MG tablet Take 1-2 tablets (5-10 mg total) by mouth 3 (three) times daily as needed for muscle spasms. 60 tablet 0  . diclofenac (VOLTAREN) 75 MG EC tablet Take 75 mg by mouth 2 (two) times daily as needed (pain/inflammation).   1  . fluticasone (FLONASE) 50 MCG/ACT nasal spray Place 1 spray into both nostrils daily as needed (congestion).    . hydrochlorothiazide (HYDRODIURIL) 25 MG tablet Take 25 mg by mouth daily.  1  . lidocaine-prilocaine (EMLA) cream Apply to affected  area once (Patient taking differently: Apply 1 application topically once as needed (prior to port access). ) 30 g 3  . metoprolol succinate (TOPROL-XL) 25 MG 24 hr tablet Take 25 mg by mouth daily.   1  . morphine (MS CONTIN) 15 MG 12 hr tablet Take 1 tablet (15 mg total) by mouth every 8 (eight) hours. 90 tablet 0  . Morphine Sulfate (MORPHINE CONCENTRATE) 10 MG/0.5ML SOLN concentrated solution Take 0.25-0.5 mLs (5-10 mg total) by mouth every 4 (four) hours as needed for severe pain. 30 mL 0  . ondansetron (ZOFRAN) 8 MG tablet Take 1 tablet (8 mg total) by mouth 2 (two) times daily as needed for refractory nausea / vomiting. Start on day 3 after chemotherapy. 30 tablet 1  . pravastatin (PRAVACHOL) 80 MG tablet Take 80 mg by mouth daily.    . prochlorperazine (COMPAZINE) 10 MG tablet Take 1 tablet (10 mg total) by mouth every 6 (six) hours as needed (Nausea or vomiting). 30 tablet 1  . Rivaroxaban 15 & 20 MG TBPK Take as directed on package: Start with one '15mg'$  tablet by mouth twice a day with food. On Day 22, switch to one '20mg'$  tablet once a day with food. 51 each 0  . zolpidem (AMBIEN) 10 MG tablet Take 10 mg by mouth at bedtime.   0   No current facility-administered medications for this visit.    Facility-Administered Medications Ordered in Other Visits  Medication Dose Route Frequency Provider Last Rate Last Dose  . technetium medronate (TC-MDP) injection 25 millicurie  25 millicurie Intravenous Once PRN Lajean Manes, MD        REVIEW OF SYSTEMS:   Constitutional: Denies fevers, chills or abnormal night sweats (+) weight loss Eyes: Denies blurriness of vision, double vision or watery eyes Ears, nose, mouth, throat, and face: Denies mucositis or sore throat (+) dysphagia and feeling full quickly Respiratory: Denies cough, dyspnea or wheezes Cardiovascular: Denies palpitation, chest discomfort or lower extremity swelling Gastrointestinal:  Denies nausea, heartburn or change in bowel  habits Skin: Denies abnormal skin rashes Musculoskeletal: (+) back pain Lymphatics: Denies new lymphadenopathy or easy bruising Neurological:Denies numbness, tingling or new weaknesses Behavioral/Psych: Mood is stable, no new changes  All other systems were reviewed with the patient and are negative.  PHYSICAL EXAMINATION: ECOG PERFORMANCE STATUS: 1 - Symptomatic but completely ambulatory  Vitals:   03/07/16 1422  BP: (!) 121/99  Pulse: (!) 118  Resp: 18  Temp: 98 F (36.7 C)   Filed Weights   03/07/16 1422  Weight: 215 lb 14.4 oz (97.9 kg)    GENERAL:alert, no distress and comfortable SKIN: skin color, texture, turgor are normal, no rashes or significant lesions EYES: normal, conjunctiva are pink and non-injected, sclera clear OROPHARYNX:no exudate, no erythema and lips, buccal mucosa, and tongue normal  NECK: supple, thyroid normal size, non-tender, without nodularity LYMPH:  no palpable lymphadenopathy in the cervical, axillary or inguinal LUNGS: clear to auscultation and percussion with normal breathing effort HEART: regular rate & rhythm and no murmurs and no lower extremity edema ABDOMEN:abdomen soft, non-tender and normal bowel sounds Musculoskeletal:no cyanosis of digits and no clubbing  PSYCH: alert & oriented x 3 with fluent speech NEURO: no focal motor/sensory deficits  LABORATORY DATA:  I have reviewed the data as listed CBC Latest Ref Rng & Units 03/06/2016 03/05/2016 03/04/2016  WBC 4.0 - 10.5 K/uL 10.0 9.6 8.4  Hemoglobin 13.0 - 17.0 g/dL 12.1(L) 12.1(L) 11.9(L)  Hematocrit 39.0 - 52.0 % 34.7(L) 36.0(L) 34.1(L)  Platelets 150 - 400 K/uL 168 267 285     CMP Latest Ref Rng & Units 03/06/2016 03/04/2016 03/03/2016  Glucose 65 - 99 mg/dL 105(H) 103(H) 138(H)  BUN 6 - 20 mg/dL '14 16 20  '$ Creatinine 0.61 - 1.24 mg/dL 0.89 0.93 0.99  Sodium 135 - 145 mmol/L 132(L) 135 135  Potassium 3.5 - 5.1 mmol/L 3.7 3.4(L) 3.8  Chloride 101 - 111 mmol/L 97(L) 99(L) 95(L)   CO2 22 - 32 mmol/L '24 29 29  '$ Calcium 8.9 - 10.3 mg/dL 8.8(L) 8.3(L) 8.6(L)  Total Protein 6.4 - 8.3 g/dL - - -  Total Bilirubin 0.20 - 1.20 mg/dL - - -  Alkaline Phos 40 - 150 U/L - - -  AST 5 - 34 U/L - - -  ALT 0 - 55 U/L - - -    PATHOLOGY REPORT  Diagnosis 02/15/2016 Adenocarcinoma present involving at least laminal propria and arising in background of intestinal metaplasia associated high-grade granular dysplasia. Positive for lymphovascular invasion.   RADIOGRAPHIC STUDIES: I have personally reviewed the radiological images as listed and agreed with the findings in the report. Ct Chest W Contrast  Addendum Date: 02/09/2016   ADDENDUM REPORT: 02/09/2016 15:42 ADDENDUM: Study discussed by telephone with Dr. Lennette Bihari LITTLE on 02/09/2016 At 1528 hours. Electronically Signed   By: Genevie Ann M.D.   On: 02/09/2016 15:42   Result Date: 02/09/2016 CLINICAL DATA:  61 year old male with chest wall pain, she bilateral shoulder blade pain, low back pain. Symptoms for 3-4 weeks. Former smoker. Initial encounter. EXAM: CT CHEST WITH CONTRAST TECHNIQUE: Multidetector CT imaging of the chest was performed during intravenous contrast administration. CONTRAST:  75m ISOVUE-300 IOPAMIDOL (ISOVUE-300) INJECTION 61% COMPARISON:  Chest radiographs 01/05/2016. FINDINGS: Cardiovascular: Calcified coronary artery atherosclerosis. Minimal calcified aortic atherosclerosis. No pericardial effusion. Mediastinum/Nodes: Bulky and eccentric soft tissue thickening in the lower third of the thoracic esophagus which appears irregular and masslike. See sagittal image 100. Soft tissue thickening up to 2 cm. Associated posterior and subcarinal mediastinal lymphadenopathy, with increased size and number of lymph nodes. Nodes individually up to 13-16 mm short axis. Superimposed mild AP window and moderate right peritracheal adenopathy (right peritracheal node up to 15 mm).  Lungs/Pleura: Major airways are patent. Areas of curvilinear  subpleural and perihilar lung scarring mostly on the left. Two small 3-4 mm indeterminate left lung nodules (series 5, image 78 and image 91). No pleural effusion. Small probable intrapleural lymph node at the confluence of the right major and minor fissures (series 5, image 79 and coronal image 90). Other subtle nodularity along the pleural fissures is indeterminate. Upper Abdomen: The gastroesophageal junction appears normal. Negative visible stomach and other bowel in the upper abdomen. Mild retrocrural lymphadenopathy. Surgically absent gallbladder. Negative visible liver, spleen, pancreas, adrenal glands and kidneys. Musculoskeletal: Osseous metastatic disease. Destructive 3 x 5 cm lesion of the posterior right fifth rib (series 2, image 48). Lytic lesion of the posterior right seventh rib. Nearby mildly displaced right posterior lateral seventh rib fracture might be pathologic (series 5, image 77). 2.5 x 3.5 lytic metastasis anterior left third rib. Smaller lytic metastasis with pathologic fracture posterior left fifth rib (series 2, image 59). Mildly displaced probable pathologic fracture posterior lateral left third rib (image 43). Scattered vertebral metastases including multiple small lytic vertebral body metastases in the thoracic spine (e.g. T4 series 4, image 106). Larger lytic left T11 metastasis (series 2, image 125) without strong evidence of epidural extension. Similar lytic posterior element metastasis right L1 vertebra (series 2, image 154). IMPRESSION: 1. Constellation of abnormal posterior mediastinum and lytic osseous metastatic disease most compatible with stage IV esophageal carcinoma. 2. Distal third thoracic esophagus masslike soft tissue thickening and irregularity up to 2 cm. See sagittal image 100. Recommend EGD. 3. Multiple destructive rib metastases with pathologic fractures. Multiple thoracic and upper lumbar vertebral metastases, though none with suspected epidural extension at this  time. Electronically Signed: By: Genevie Ann M.D. On: 02/09/2016 15:20   Ct Angio Chest Pe W And/or Wo Contrast  Addendum Date: 03/02/2016   ADDENDUM REPORT: 03/02/2016 20:59 ADDENDUM: There soft tissue densities outlined by contrast in the distal SVC and right atrium potentially representing thrombus as well. Electronically Signed   By: Ashley Royalty M.D.   On: 03/02/2016 20:59   Result Date: 03/02/2016 CLINICAL DATA:  Metastatic esophageal cancer with dyspnea. EXAM: CT ANGIOGRAPHY CHEST WITH CONTRAST TECHNIQUE: Multidetector CT imaging of the chest was performed using the standard protocol during bolus administration of intravenous contrast. Multiplanar CT image reconstructions and MIPs were obtained to evaluate the vascular anatomy. CONTRAST:  80 cc of Isovue 370 IV COMPARISON:  02/09/2016 chest CT FINDINGS: Cardiovascular: Acute bilateral lobar through segmental right upper, lobar through subsegmental right lower and left upper lobar through segmental pulmonary emboli are noted with right heart strain (RV/ LV ratio = 1). Calcified coronary atherosclerosis with minimal calcified aortic atherosclerosis. Top normal size cardiac chambers without pericardial effusion. Mediastinum/Nodes: Masslike abnormality in the distal esophagus is again noted involving the lower third with single wall thickness up to 2.1 cm unchanged in appearance. Associated posterior and subcarinal mediastinal lymphadenopathy is again identified without significant change. Superimposed mild AP window adenopathy to 12 mm short axis, paraesophageal adenopathy to 14 mm short axis and cluster of subcarinal lymph nodes are again noted slightly more prominent in the subcarinal region with slight increase in number of lymph nodes suggested. Stable left para-aortic lymphadenopathy. Lungs/Pleura: Patchy ground-glass opacities are noted the visualized lung possibly representing hypoventilatory change. There is atelectasis at the right lung base. Upper  Abdomen: The GE junction appears unremarkable. Stomach is contracted to the extent visible. 12 mm indeterminate left hepatic hypodensity is suggested on current exam not apparent  on previous. Musculoskeletal: Interval progression of osseous metastatic disease with interval increase in size of several lytic foci in particular the manubrium, right scapula, T2,T4, the T9 and T11 vertebral bodies. Suspicious for epidural involvement of the left at T11. Multiple osteolytic bony destruction of bilateral ribs soft tissue components. Review of the MIP images confirms the above findings. IMPRESSION: Positive for acute PE with CT evidence of right heart strain (RV/LV Ratio = 1) consistent with at least submassive (intermediate risk) PE. The presence of right heart strain has been associated with an increased risk of morbidity and mortality. Please activate Code PE by paging [phone]. Critical Value/emergent results were called by telephone at the time of interpretation on 03/02/2016 at 8:05 pm to Dr. Davonna Belling , who verbally acknowledged these results. Distal esophageal mass with progression of metastatic disease. Of note, suggestion of epidural involvement at T11 on the left. Electronically Signed: By: Ashley Royalty M.D. On: 03/02/2016 20:05   Mr Thoracic Spine W Wo Contrast  Result Date: 03/05/2016 CLINICAL DATA:  Metastatic esophageal cancer. EXAM: MRI THORACIC SPINE WITH CONTRAST TECHNIQUE: Multiplanar, multisequence MR imaging of the thoracic spine was performed following the administration of intravenous contrast. COMPARISON:  Chest CT 03/02/2016 FINDINGS: Alignment:  Normal Vertebrae: Diffuse osseous metastatic disease involving the cervical, thoracic and upper lumbar spine. Numerous enhancing lesions throughout the spine. Cord:  No cord lesions or abnormal cord enhancement.  No syrinx. Paraspinal and other soft tissues: Extensive mediastinal disease is noted along with a distal esophageal mass. There is a  large lesion associated with the fifth right rib with a large soft tissue component. Disc levels: The most significant thoracic disease is located at T11 on the left side. There is tumor in the posterior aspect of the vertebral body on the left side which extends into the expanded left pedicle and into the left neural foramen, left lateral spinal canal, lamina and transverse process. Epidural tumor is displacing the cord slightly to the right. There is also left-sided T12 tumor extending out through the lateral cortex of vertebral body and areas tumor in the right pedicle of L1. Multilevel shallow disc protrusions are noted. I do not see any other areas of spinal canal indication by tumor. IMPRESSION: 1. Diffuse osseous metastatic disease involving the cervical spine, thoracic spine and upper lumbar spine. 2. No cord lesions. 3. The most significant thoracic metastatic disease is located at T11 on the left side with there is extensive tumor in the posterior aspect of the 2 body, in the left pedicle can left lamina and transverse process. There is also tumor bulging slightly into the left neural foramen at T11-12. There is epidural tumor on left side and there is mild mass effect on the left side of the thecal sac. Electronically Signed   By: Marijo Sanes M.D.   On: 03/05/2016 19:42   Nm Bone Scan Whole Body  Result Date: 03/02/2016 CLINICAL DATA:  History of esophageal cancer.  Back and chest pain. EXAM: NUCLEAR MEDICINE WHOLE BODY BONE SCAN TECHNIQUE: Whole body anterior and posterior images were obtained approximately 3 hours after intravenous injection of radiopharmaceutical. RADIOPHARMACEUTICALS:  26.0 mCi Technetium-85mMDP IV COMPARISON:  Abdomen and pelvis CT, 03/02/2016. Chest CT, 02/09/2016. FINDINGS: There multiple areas of abnormal radiotracer localization consistent with metastatic disease to bone, corresponding to lytic lesions noted on the prior CTs. Specifically, there are small foci of uptake  in the calvarium. Abnormal uptake is seen in the sternum and involving multiple ribs as well as  the right scapula. There is uptake from the spine most prominently from the T10. More mild appearing uptake is noted in the pelvis. There is uptake involving the right mid femur. There also areas of degenerative uptake involving the sternoclavicular joints knees and feet. Renal uptake is symmetric. IMPRESSION: Multiple areas of abnormal uptake consistent with metastatic disease to bone, corresponding to lytic bone lesions noted on the recent chest and abdomen and pelvis CTs. Electronically Signed   By: Lajean Manes M.D.   On: 03/02/2016 16:40   Ct Abdomen Pelvis W Contrast  Result Date: 03/02/2016 CLINICAL DATA:  Patient with esophageal carcinoma. EXAM: CT ABDOMEN AND PELVIS WITH CONTRAST TECHNIQUE: Multidetector CT imaging of the abdomen and pelvis was performed using the standard protocol following bolus administration of intravenous contrast. CONTRAST:  100 cc Isovue-300 COMPARISON:  Chest CT 02/09/2016. FINDINGS: Lower chest: There is masslike thickening of the distal esophagus (image 4; series 201. Enlarged periaortic lymph nodes about the distal descending thoracic aorta measuring up to 1.4 cm (image 8; series 201). Suggestion of possible filling defect within a right lower lobe pulmonary artery (image 4; series 201). Additionally within the subpleural right lower lobe there is triangular-shaped ground-glass and consolidative opacity (image 15; series 205). Hepatobiliary: Multiple low-attenuation lesions are demonstrated throughout the liver with a reference lesion in the left hepatic lobe measuring 1.2 cm (image 9; series 201) and a reference lesion within the inferior right hepatic lobe measuring 5.0 x 3.3 cm (image 35; series 201). Patient status post cholecystectomy. Pancreas: Within the pancreatic tail there is a 1.5 cm enhancing lesion (image 33; series 201). The remainder the pancreas is unremarkable.  Spleen: Unremarkable Adrenals/Urinary Tract: Mild nodularity of the adrenal glands bilaterally. Kidneys enhance symmetrically with contrast. Urinary bladder is unremarkable. Too small to characterize subcentimeter partially exophytic lesion off the interpolar region of the right kidney (image 32; series 201). Stomach/Bowel: No evidence for bowel obstruction. Stool throughout the colon. No free fluid or free intraperitoneal air. Normal morphology of the stomach. Two adjacent prominent small bowel mesenteric lymph nodes measuring up to 10 mm (image 42; series 2 with 3), potentially reactive. Metastatic lesions not excluded. Vascular/Lymphatic: Normal caliber abdominal aorta. Peripheral calcified atherosclerotic plaque. Retroaortic left renal vein. Reproductive: Prostate is unremarkable. Other: Small bilateral fat containing inguinal hernias. Musculoskeletal: Multiple lytic lesions are demonstrated throughout the visualized ribs, thoracic spine, lumbar spine as well as the pelvis. There is a 4.5 x 2.2 cm lytic lesion with soft tissue mass involving the posterior aspect of the T11 vertebral body which appears to narrow the spinal canal (image 14; series 201). Reference 4.3 cm lytic lesion within the left ilium (image 64; series 201). IMPRESSION: Findings suggestive of embolus within the visualized right lower lobe pulmonary arteries. There is an adjacent triangular-shaped subpleural ground-glass and consolidative opacity within the right lower lobe which is favored represent associated pulmonary infarct. Consider dedicated evaluation with CTA chest. Masslike thickening of the distal esophagus compatible with primary esophageal carcinoma. Multiple enlarged para-aortic lymph nodes compatible with metastatic disease. Additionally there are multiple metastatic lesions involving the liver and visualized skeleton. Note is made of a large lytic lesion with soft tissue mass involving the T11 vertebral body which appears to  narrow the spinal canal at this level. Nonspecific enhancing soft tissue mass at the level of the distal pancreas which may represent enhancing primary pancreatic lesion versus splenule. Metastatic disease is considered less likely. Critical Value/emergent results were called by telephone at the time of interpretation  on 03/02/2016 at 2:16 pm to Dr. Marin Olp, who verbally acknowledged these results. Electronically Signed   By: Lovey Newcomer M.D.   On: 03/02/2016 14:17   Ir US Guide Vasc Access Right  Result Date: 02/24/2016 CLINICAL DATA:  Metastatic esophageal cancer EXAM: RIGHT INTERNAL JUGULAR SINGLE LUMEN POWER PORT CATHETER INSERTION Date:  12/22/201712/22/2017 2:30 pm Radiologist:  M. Daryll Brod, MD Guidance:  Ultrasound and fluoroscopic MEDICATIONS: 2 g Ancef; The antibiotic was administered within an appropriate time interval prior to skin puncture. ANESTHESIA/SEDATION: Versed 4.0 mg IV; Fentanyl 100 mcg IV; Moderate Sedation Time:  27 minutes The patient was continuously monitored during the procedure by the interventional radiology nurse under my direct supervision. FLUOROSCOPY TIME:  36 seconds (16 mGy) COMPLICATIONS: None immediate. CONTRAST:  None. PROCEDURE: Informed consent was obtained from the patient following explanation of the procedure, risks, benefits and alternatives. The patient understands, agrees and consents for the procedure. All questions were addressed. A time out was performed. Maximal barrier sterile technique utilized including caps, mask, sterile gowns, sterile gloves, large sterile drape, hand hygiene, and 2% chlorhexidine scrub. Under sterile conditions and local anesthesia, right internal jugular micropuncture venous access was performed. Access was performed with ultrasound. Images were obtained for documentation. A guide wire was inserted followed by a transitional dilator. This allowed insertion of a guide wire and catheter into the IVC. Measurements were obtained from the  SVC / RA junction back to the right IJ venotomy site. In the right infraclavicular chest, a subcutaneous pocket was created over the second anterior rib. This was done under sterile conditions and local anesthesia. 1% lidocaine with epinephrine was utilized for this. A 2.5 cm incision was made in the skin. Blunt dissection was performed to create a subcutaneous pocket over the right pectoralis major muscle. The pocket was flushed with saline vigorously. There was adequate hemostasis. The port catheter was assembled and checked for leakage. The port catheter was secured in the pocket with two retention sutures. The tubing was tunneled subcutaneously to the right venotomy site and inserted into the SVC/RA junction through a valved peel-away sheath. Position was confirmed with fluoroscopy. Images were obtained for documentation. The patient tolerated the procedure well. No immediate complications. Incisions were closed in a two layer fashion with 4 - 0 Vicryl suture. Dermabond was applied to the skin. The port catheter was accessed, blood was aspirated followed by saline and heparin flushes. Needle was removed. A dry sterile dressing was applied. IMPRESSION: Ultrasound and fluoroscopically guided right internal jugular single lumen power port catheter insertion. Tip in the SVC/RA junction. Catheter ready for use. Electronically Signed   By: Jerilynn Mages.  Shick M.D.   On: 02/24/2016 14:34   Dg Shoulder Left  Result Date: 03/02/2016 CLINICAL DATA:  Felt pop at left shoulder when getting up. Initial encounter. EXAM: LEFT SHOULDER - 2+ VIEW COMPARISON:  None. FINDINGS: There is no evidence of fracture or dislocation. The left humeral head is seated within the glenoid fossa. The acromioclavicular joint is unremarkable in appearance. No significant soft tissue abnormalities are seen. The visualized portions of the left lung are clear. IMPRESSION: No evidence of fracture or dislocation. Electronically Signed   By: Garald Balding  M.D.   On: 03/02/2016 19:55   Ir Fluoro Guide Port Insertion Right  Result Date: 02/24/2016 CLINICAL DATA:  Metastatic esophageal cancer EXAM: RIGHT INTERNAL JUGULAR SINGLE LUMEN POWER PORT CATHETER INSERTION Date:  12/22/201712/22/2017 2:30 pm Radiologist:  M. Daryll Brod, MD Guidance:  Ultrasound and fluoroscopic MEDICATIONS:  2 g Ancef; The antibiotic was administered within an appropriate time interval prior to skin puncture. ANESTHESIA/SEDATION: Versed 4.0 mg IV; Fentanyl 100 mcg IV; Moderate Sedation Time:  27 minutes The patient was continuously monitored during the procedure by the interventional radiology nurse under my direct supervision. FLUOROSCOPY TIME:  36 seconds (16 mGy) COMPLICATIONS: None immediate. CONTRAST:  None. PROCEDURE: Informed consent was obtained from the patient following explanation of the procedure, risks, benefits and alternatives. The patient understands, agrees and consents for the procedure. All questions were addressed. A time out was performed. Maximal barrier sterile technique utilized including caps, mask, sterile gowns, sterile gloves, large sterile drape, hand hygiene, and 2% chlorhexidine scrub. Under sterile conditions and local anesthesia, right internal jugular micropuncture venous access was performed. Access was performed with ultrasound. Images were obtained for documentation. A guide wire was inserted followed by a transitional dilator. This allowed insertion of a guide wire and catheter into the IVC. Measurements were obtained from the SVC / RA junction back to the right IJ venotomy site. In the right infraclavicular chest, a subcutaneous pocket was created over the second anterior rib. This was done under sterile conditions and local anesthesia. 1% lidocaine with epinephrine was utilized for this. A 2.5 cm incision was made in the skin. Blunt dissection was performed to create a subcutaneous pocket over the right pectoralis major muscle. The pocket was flushed  with saline vigorously. There was adequate hemostasis. The port catheter was assembled and checked for leakage. The port catheter was secured in the pocket with two retention sutures. The tubing was tunneled subcutaneously to the right venotomy site and inserted into the SVC/RA junction through a valved peel-away sheath. Position was confirmed with fluoroscopy. Images were obtained for documentation. The patient tolerated the procedure well. No immediate complications. Incisions were closed in a two layer fashion with 4 - 0 Vicryl suture. Dermabond was applied to the skin. The port catheter was accessed, blood was aspirated followed by saline and heparin flushes. Needle was removed. A dry sterile dressing was applied. IMPRESSION: Ultrasound and fluoroscopically guided right internal jugular single lumen power port catheter insertion. Tip in the SVC/RA junction. Catheter ready for use. Electronically Signed   By: Jerilynn Mages.  Shick M.D.   On: 02/24/2016 14:34   MR thoracic spine with / without contrast 03/05/2016 IMPRESSION: 1. Diffuse osseous metastatic disease involving the cervical spine, thoracic spine and upper lumbar spine. 2. No cord lesions. 3. The most significant thoracic metastatic disease is located at T11 on the left side with there is extensive tumor in the posterior aspect of the 2 body, in the left pedicle can left lamina and transverse process. There is also tumor bulging slightly into the left neural foramen at T11-12. There is epidural tumor on left side and there is mild mass effect on the left side of the thecal sac.  ASSESSMENT & PLAN:  61 y.o. Caucasian male, with past medical history of hypertension, presented with bilateral chest pain and dysphagia  1. 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 -His restaging CT scan showed significant disease progression in his bones, especially the T 11 lesion. I reviewed the CT  and MRI scan in our GI tumor Board this morning, and discussed with radiation oncologist Dr. Lisbeth Renshaw who recommends palliative radiation to his T11 lesion as soon as possible, to prevent cord compression. -He also has moderate dysplasia, slightly worse, we discussed palliative radiation to his primary esophageal tumor, to improve his swallowing, he is interested. -The patient will meet with Dr. Lisbeth Renshaw for radiation consultation tomorrow. -He has started systemic chemotherapy FOLFOX last week, tolerated very well. -I may consider weekly carboplatin and Taxol during his radiation. Due to his diffuse metastasis, I am concerned his cancer progression during the palliative radiation, if he is off chemotherapy. -There was insufficient tumor present for Her2 evaluation. I reviewed this with the patient and family. I may repeat biopsy down the road if he is more stable on treatment.  2. Bilateral acute submassive PE  -This was discovered on his staging CT scan, he was not much symptomatic -His oxygen level has improved after he started anticoagulation, he does not need oxygen supplement. -We'll continue Xarelto indefinitely. We discussed the pros and cons of right options of anticoagulation. If he has issues with bleeding on Xarelto, I would recommend him to switch to Coumadin, which is more reversible.  3. Hypercalcemia -likely secondary to his underlying malignancy -he received zometa on 02/22/16 -His calcium has normalized,  8.8 on 1/2,  -follow-up for his calcium level closely  4. Dysphagia, weight loss -I encouraged him to take nutrition supplement, such as ensure boost, at least 3 bottles  daily  -He is scheduled for follow up with nutrition on 03/14/2016  -We discussed palliative radiation for his dysphagia  5. Chest pain -Secondary to his bone metastasis -He is on MS contin '15mg'$  q12h, and liquid morphine 10 mg every 4 hours as needed, tolerating well, pain is better controlled  -Taking Flexeril 5-10 mg every 8 hours as needed  -Constipation and management strategies reviewed with patient again  6. Hypertension and his tachycardia -Likely secondary to his pain -We'll monitor  7. Goal of care discussion  -We had a lengthy discussion about his short-term and long-term goal of care.  -We discussed the incurable nature of his cancer, and the overall poor prognosis, especially if he does not have good response to chemotherapy or progress on chemo -The patient completely agrees with the goal of care is palliative, he is very realistic, plan to get his power of attorney and living well down today. -Patient states he values his quality of life more than his quality of life -I recommend DNR/DNI, he is in full agreement.   Plan -I presented his case in our GI tumor board this morning -The patient will meet with Dr. Lisbeth Renshaw for radiation consultation tomorrow. I will make chemotherapy recommendations based on his radiation therapy plans.  -I plan to see him back on 03/14/2016. At this visit, we will talk about chemo treatment recommendations. I will likely offer weekly carboplatin and Taxol during his radiation  All questions were answered. The patient knows to call the clinic with any problems, questions or concerns.  I spent 30 minutes counseling the patient face to face. The total time spent in the appointment was 45 minutes and more than 50% was on counseling.   This document serves as a record of services personally performed by Truitt Merle, MD. It was created on her behalf by Arlyce Harman, a trained medical scribe. The creation of this record is based on the scribe's personal  observations and the provider's statements to them. This  document has been checked and approved by the attending provider.   Truitt Merle, MD 03/07/2016

## 2016-03-07 NOTE — Progress Notes (Signed)
Walter Craig  Clinical Social Craig was referred by Dr. Burr Medico to review and complete healthcare advance directives.  Clinical Social Worker met with patient and his daughter, Walter Craig in [place] office.  The patient designated daughter, Walter Craig as their primary healthcare agent and did not designate a secondary agent.  Patient also completed healthcare living will.    Clinical Social Worker notarized documents and made copies for patient/family. Clinical Social Worker will send documents to medical records to be scanned into patient's chart. Clinical Social Worker encouraged patient/family to contact with any additional questions or concerns.  CSW shared with pt and daughter resources to assist and support programs available to them. Pt and daughter are interested in practical planning information regarding end of life concerns. CSW and pt to continue to explore these concerns further.   Loren Racer, [place] Worker [place]  [place] Phone: [phone] Fax: [phone]

## 2016-03-08 ENCOUNTER — Ambulatory Visit
Admission: RE | Admit: 2016-03-08 | Discharge: 2016-03-08 | Disposition: A | Discharge status: Home / Self Care | Payer: BLUE CROSS/BLUE SHIELD | Source: Ambulatory Visit | Attending: Radiation Oncology | Admitting: Radiation Oncology

## 2016-03-08 ENCOUNTER — Encounter: Payer: Self-pay | Admitting: Radiation Oncology

## 2016-03-08 ENCOUNTER — Encounter: Payer: Self-pay | Admitting: *Deleted

## 2016-03-08 VITALS — BP 122/95 | HR 136 | Temp 98.1°F | Ht 70.0 in | Wt 215.6 lb

## 2016-03-08 DIAGNOSIS — G4730 Sleep apnea, unspecified: Secondary | ICD-10-CM | POA: Insufficient documentation

## 2016-03-08 DIAGNOSIS — E785 Hyperlipidemia, unspecified: Secondary | ICD-10-CM | POA: Insufficient documentation

## 2016-03-08 DIAGNOSIS — C7951 Secondary malignant neoplasm of bone: Secondary | ICD-10-CM | POA: Diagnosis not present

## 2016-03-08 DIAGNOSIS — F419 Anxiety disorder, unspecified: Secondary | ICD-10-CM | POA: Diagnosis not present

## 2016-03-08 DIAGNOSIS — Z87891 Personal history of nicotine dependence: Secondary | ICD-10-CM | POA: Diagnosis not present

## 2016-03-08 DIAGNOSIS — Z79899 Other long term (current) drug therapy: Secondary | ICD-10-CM | POA: Insufficient documentation

## 2016-03-08 DIAGNOSIS — Z7901 Long term (current) use of anticoagulants: Secondary | ICD-10-CM | POA: Diagnosis not present

## 2016-03-08 DIAGNOSIS — Z86711 Personal history of pulmonary embolism: Secondary | ICD-10-CM | POA: Diagnosis not present

## 2016-03-08 DIAGNOSIS — I10 Essential (primary) hypertension: Secondary | ICD-10-CM | POA: Diagnosis not present

## 2016-03-08 DIAGNOSIS — C159 Malignant neoplasm of esophagus, unspecified: Secondary | ICD-10-CM | POA: Insufficient documentation

## 2016-03-08 DIAGNOSIS — Z800 Family history of malignant neoplasm of digestive organs: Secondary | ICD-10-CM | POA: Diagnosis not present

## 2016-03-08 DIAGNOSIS — Z510 Encounter for antineoplastic radiation therapy: Secondary | ICD-10-CM | POA: Diagnosis not present

## 2016-03-08 DIAGNOSIS — M545 Low back pain: Secondary | ICD-10-CM | POA: Insufficient documentation

## 2016-03-08 DIAGNOSIS — R1310 Dysphagia, unspecified: Secondary | ICD-10-CM | POA: Diagnosis not present

## 2016-03-08 DIAGNOSIS — F329 Major depressive disorder, single episode, unspecified: Secondary | ICD-10-CM | POA: Diagnosis not present

## 2016-03-08 HISTORY — DX: Anxiety disorder, unspecified: F41.9

## 2016-03-08 HISTORY — DX: Depression, unspecified: F32.A

## 2016-03-08 HISTORY — DX: Malignant neoplasm of bone and articular cartilage, unspecified: C41.9

## 2016-03-08 HISTORY — DX: Major depressive disorder, single episode, unspecified: F32.9

## 2016-03-08 HISTORY — DX: Malignant neoplasm of esophagus, unspecified: C15.9

## 2016-03-08 HISTORY — DX: Other pulmonary embolism without acute cor pulmonale: I26.99

## 2016-03-08 MED ORDER — DEXAMETHASONE 4 MG PO TABS: 2.0000 mg | ORAL_TABLET | Freq: Two times a day (BID) | ORAL | 0 refills | Status: DC

## 2016-03-08 MED FILL — DEXAMETHASONE 4 MG TABLET: 4 | 30 days supply | Qty: 30 | Fill #0

## 2016-03-08 NOTE — Progress Notes (Deleted)
Dr. Lisbeth Renshaw discusses the findings on the patient's imaging studies, taking into consideration his symptoms of back pain, and dysphagia. He recommends proceeding with radiotherapy to the thoracic spine from T4-L1 and to the esophagus. In the spine we anticipate the T11 findings stabilize and regress. We would also recommend a course of dexamethasone to help protect his spinal cord from edema related to his disease. He also discusses the findings in the ribs, and reviews that these are also sites we could consider treating with radiotherapy. We discussed the risks, benefits, short, and long term effects of radiotherapy. He discusses the delivery and logistics of treatment. He would recommend a course of 15 fractions over 3 weeks. We have reviewed consent and he has signed this. He will proceed with simulation today, and begin radiotherapy next Monday. Dr. Burr Medico will be adding chemotherapy to his regimen as well with taxol and carboplatin.

## 2016-03-08 NOTE — Progress Notes (Signed)
[place] Psychosocial Distress Screening Clinical Social Work  Clinical Social Work was referred by distress screening protocol.  The patient scored a 7 on the Psychosocial Distress Thermometer which indicates severe distress. Clinical Education officer, museum met with pt and his daughter after SIM to assess for distress and other psychosocial needs. CSW had also met with them yesterday and was able to provide them with additional resources to assist. Pt was able to get handicap placard form filled out from [place]. His daughter plans to take him to the [facility] to get today. CSW also discussed other transportation resources such as SCAT and ACS. Pt feels family can assist in driving and CSW shared Living with Cancer app as tool to coordinate rides. Per pt, daughter, Janett Billow lives in [place], [place] and will come to help as needed. Pt has considered moving closer to be with her. Pt has two sons locally, but they are not as equipped to help per pt and daughter. Pt and family have many, many questions regarding financial planning in end of life. CSW provided family with booklet "A Guide to Financial Planning" through [employer]. Pt and family also open to ongoing support and plan to reach out for additional counseling and support in the coming weeks. CSW available to assist as needed.   ONCBCN DISTRESS SCREENING 03/08/2016  Screening Type Initial Screening  Distress experienced in past week (1-10) 7  Practical problem type Insurance;Transportation;Food  Spiritual/Religous concerns type Relating to God  Information Concerns Type Lack of info about complementary therapy choices  Physical Problem type Pain;Getting around;Bathing/dressing;Loss of appetitie;Constipation/diarrhea    Clinical Social Worker follow up needed: Yes.    If yes, follow up plan:  See above Loren Racer, [place] Worker [place]  [facility] Phone: [phone] Fax: [phone]

## 2016-03-08 NOTE — Progress Notes (Signed)
Radiation Oncology         [phone]) [phone] ________________________________  Name: Walter Craig MRN: [id number]  Date: 03/08/2016  DOB: 08/04/1955  [id number] Marigene Ehlers, MD  Truitt Merle, MD     REFERRING PHYSICIAN: Truitt Merle, MD   DIAGNOSIS: The encounter diagnosis was Esophageal cancer, stage IV ([place]).   HISTORY OF PRESENT ILLNESS: Walter Craig is a 61 y.o. male seen at the request of Dr. Burr Medico for newly diagnosed stage IV esophageal cancer. Unfortunately he presented initially with bilateral chest and back pain in November 2017. A CT scan was ultimately obtained after chest x-ray did not reveal a cause for his symptoms. Along the thoracic esophagus on his CT scan there is a masslike soft tissue thickening with irregularity at 2 cm. Multiple mediastinal and osseous metastases were identified concerning for stage IV disease. He underwent EGD on 02/15/2016 which revealed a large mucosal change of the esophagus consistent with Barrett's esophagus, as well as a large malignant mass in the distal esophagus obstructing three quarters circumferentially. Biopsies were consistent with adenocarcinoma. The patient was also diagnosed on 03/02/2016 with a large pulmonary embolism in central distribution with evidence of right heart strain. He is currently taking Xarelto for this.  An MRI of the thoracic spine was performed revealing disease within the T11 vertebral body, extending posteriorly along the left into an expanded left pedicle and into the left neural foramen which was found in the left lateral spinal canal lamina and transverse process with epidural tumor displacing the cord but not causing complete compression. There was also left sided T12 tumor extending to the lateral cortex of the vertebral body and towards the right pedicle of L1 given these findings he comes today to discuss the role of palliative radiotherapy. Prior to this finding he was intending to start FOLFOX q 14 days, and received his first dose  of this on 12/28/1   PREVIOUS RADIATION THERAPY: No   PAST MEDICAL HISTORY:  Past Medical History:  Diagnosis Date  . Anxiety   . Bone cancer ([place])    t=11 lytic lesion  . Cancer ([place]) 02/2016   low esophagus cancer, mets   . Depression   . Depression with anxiety   . Esophageal cancer ([place]) 02/24/2016   invasive adenocarcinoma  . HLD (hyperlipidemia)   . Hypertension   . Pulmonary embolism ([place])   . Sleep apnea        PAST SURGICAL HISTORY: Past Surgical History:  Procedure Laterality Date  . CHOLECYSTECTOMY    . IR GENERIC HISTORICAL  02/24/2016   IR US GUIDE VASC ACCESS RIGHT 02/24/2016 Greggory Keen, MD [facility]  . IR GENERIC HISTORICAL  02/24/2016   IR FLUORO GUIDE PORT INSERTION RIGHT 02/24/2016 Greggory Keen, MD [facility]  . left knee meniscus repare        FAMILY HISTORY:  Family History  Problem Relation Age of Onset  . Cancer Father     colon cancer   . Cancer Sister     colon cancer   . Cancer Brother 29    gallbladder cancer      SOCIAL HISTORY:  reports that he quit smoking about 12 years ago. He has a 45.00 pack-year smoking history. He has never used smokeless tobacco. He reports that he drinks about 2.4 - 3.0 oz of alcohol per week . He reports that he uses drugs, including Marijuana. The patient is married and resides in [place], [place].   ALLERGIES: Patient has no known allergies.  MEDICATIONS:  Current Outpatient Prescriptions  Medication Sig Dispense Refill  . ALPRAZolam (XANAX) 0.5 MG tablet Take 0.5 mg by mouth at bedtime as needed for sleep.    . cyclobenzaprine (FLEXERIL) 5 MG tablet Take 1-2 tablets (5-10 mg total) by mouth 3 (three) times daily as needed for muscle spasms. 60 tablet 0  . diclofenac (VOLTAREN) 75 MG EC tablet Take 75 mg by mouth 2 (two) times daily as needed (pain/inflammation).   1  . fluticasone (FLONASE) 50 MCG/ACT nasal spray Place 1 spray into both nostrils daily as needed (congestion).    .  hydrochlorothiazide (HYDRODIURIL) 25 MG tablet Take 25 mg by mouth daily.  1  . lidocaine-prilocaine (EMLA) cream Apply to affected area once (Patient taking differently: Apply 1 application topically once as needed (prior to port access). ) 30 g 3  . metoprolol succinate (TOPROL-XL) 25 MG 24 hr tablet Take 25 mg by mouth daily.   1  . morphine (MS CONTIN) 15 MG 12 hr tablet Take 1 tablet (15 mg total) by mouth every 8 (eight) hours. 90 tablet 0  . Morphine Sulfate (MORPHINE CONCENTRATE) 10 MG/0.5ML SOLN concentrated solution Take 0.25-0.5 mLs (5-10 mg total) by mouth every 4 (four) hours as needed for severe pain. 30 mL 0  . Rivaroxaban 15 & 20 MG TBPK Take as directed on package: Start with one 15mg  tablet by mouth twice a day with food. On Day 22, switch to one 20mg  tablet once a day with food. 51 each 0  . zolpidem (AMBIEN) 10 MG tablet Take 10 mg by mouth at bedtime.   0  . dexamethasone (DECADRON) 4 MG tablet Take 0.5 tablets (2 mg total) by mouth 2 (two) times daily. 30 tablet 0  . ondansetron (ZOFRAN) 8 MG tablet Take 1 tablet (8 mg total) by mouth 2 (two) times daily as needed for refractory nausea / vomiting. Start on day 3 after chemotherapy. (Patient not taking: Reported on 03/08/2016) 30 tablet 1  . pravastatin (PRAVACHOL) 80 MG tablet Take 80 mg by mouth daily.    . prochlorperazine (COMPAZINE) 10 MG tablet Take 1 tablet (10 mg total) by mouth every 6 (six) hours as needed (Nausea or vomiting). (Patient not taking: Reported on 03/08/2016) 30 tablet 1   No current facility-administered medications for this encounter.      REVIEW OF SYSTEMS: On review of systems, the patient reports that he is doing ok but does have significant back pain, and pain in the right shoulder blade region. He denies any chest pain, shortness of breath, cough, fevers, chills, night sweats. He has lost about 15-20 pounds in the last month. He has had significant dysphagia as well in the last month. He denies any bowel  or bladder disturbances, and denies abdominal pain, nausea or vomiting. He denies any additional musculoskeletal or joint aches or pains. A complete review of systems is obtained and is otherwise negative.     PHYSICAL EXAM:  Wt Readings from Last 3 Encounters:  03/08/16 215 lb 9.6 oz (97.8 kg)  03/07/16 215 lb 14.4 oz (97.9 kg)  03/06/16 218 lb 11.2 oz (99.2 kg)   Temp Readings from Last 3 Encounters:  03/08/16 98.1 F (36.7 C)  03/07/16 98 F (36.7 C) (Oral)  03/06/16 99.3 F (37.4 C) (Oral)   BP Readings from Last 3 Encounters:  03/08/16 (!) 122/95  03/07/16 (!) 121/99  03/06/16 122/75   Pulse Readings from Last 3 Encounters:  03/08/16 (!) 136  03/07/16 Marland Kitchen)  118  03/06/16 (!) 103     Pain scale 7/10 In general this is an uncomfortable appearing Caucasian male in no acute distress. He is alert and oriented x4 and appropriate throughout the examination. HEENT reveals that the patient is normocephalic, atraumatic. EOMs are intact. PERRLA. Skin is intact without any evidence of gross lesions. Cardiovascular exam reveals a regular rate and rhythm, no clicks rubs or murmurs are auscultated. Chest is clear to auscultation bilaterally. Lymphatic assessment is performed and does not reveal any adenopathy in the cervical, supraclavicular, axillary, or inguinal chains. Abdomen has active bowel sounds in all quadrants and is intact. The abdomen is soft, non tender, non distended. Lower extremities are negative for pretibial pitting edema, deep calf tenderness, cyanosis or clubbing. Reproducible pain is noted with light palpation over the right subscapular region, and particularly along the lower thoracic spine.   ECOG = 2  0 - Asymptomatic (Fully active, able to carry on all predisease activities without restriction)  1 - Symptomatic but completely ambulatory (Restricted in physically strenuous activity but ambulatory and able to carry out work of a light or sedentary nature. For example,  light housework, office work)  2 - Symptomatic, <50% in bed during the day (Ambulatory and capable of all self care but unable to carry out any work activities. Up and about more than 50% of waking hours)  3 - Symptomatic, >50% in bed, but not bedbound (Capable of only limited self-care, confined to bed or chair 50% or more of waking hours)  4 - Bedbound (Completely disabled. Cannot carry on any self-care. Totally confined to bed or chair)  5 - Death   Eustace Pen MM, Creech RH, Tormey DC, et al. [phone]). "Toxicity and response criteria of the [facility]". [place] Oncol. 5 (6): 649-55    LABORATORY DATA:  Lab Results  Component Value Date   WBC 10.0 03/06/2016   HGB 12.1 (L) 03/06/2016   HCT 34.7 (L) 03/06/2016   MCV 88.5 03/06/2016   PLT 168 03/06/2016   Lab Results  Component Value Date   NA 132 (L) 03/06/2016   K 3.7 03/06/2016   CL 97 (L) 03/06/2016   CO2 24 03/06/2016   Lab Results  Component Value Date   ALT 41 02/29/2016   AST 48 (H) 02/29/2016   ALKPHOS 123 02/29/2016   BILITOT 0.85 02/29/2016      RADIOGRAPHY: Ct Chest W Contrast  Addendum Date: 02/09/2016   ADDENDUM REPORT: 02/09/2016 15:42 ADDENDUM: Study discussed by telephone with Dr. Lennette Bihari LITTLE on 02/09/2016 At 1528 hours. Electronically Signed   By: Genevie Ann M.D.   On: 02/09/2016 15:42   Result Date: 02/09/2016 CLINICAL DATA:  61 year old male with chest wall pain, she bilateral shoulder blade pain, low back pain. Symptoms for 3-4 weeks. Former smoker. Initial encounter. EXAM: CT CHEST WITH CONTRAST TECHNIQUE: Multidetector CT imaging of the chest was performed during intravenous contrast administration. CONTRAST:  21mL ISOVUE-300 IOPAMIDOL (ISOVUE-300) INJECTION 61% COMPARISON:  Chest radiographs 01/05/2016. FINDINGS: Cardiovascular: Calcified coronary artery atherosclerosis. Minimal calcified aortic atherosclerosis. No pericardial effusion. Mediastinum/Nodes: Bulky and eccentric soft  tissue thickening in the lower third of the thoracic esophagus which appears irregular and masslike. See sagittal image 100. Soft tissue thickening up to 2 cm. Associated posterior and subcarinal mediastinal lymphadenopathy, with increased size and number of lymph nodes. Nodes individually up to 13-16 mm short axis. Superimposed mild AP window and moderate right peritracheal adenopathy (right peritracheal node up to 15 mm).  Lungs/Pleura: Major airways are patent. Areas of curvilinear subpleural and perihilar lung scarring mostly on the left. Two small 3-4 mm indeterminate left lung nodules (series 5, image 78 and image 91). No pleural effusion. Small probable intrapleural lymph node at the confluence of the right major and minor fissures (series 5, image 79 and coronal image 90). Other subtle nodularity along the pleural fissures is indeterminate. Upper Abdomen: The gastroesophageal junction appears normal. Negative visible stomach and other bowel in the upper abdomen. Mild retrocrural lymphadenopathy. Surgically absent gallbladder. Negative visible liver, spleen, pancreas, adrenal glands and kidneys. Musculoskeletal: Osseous metastatic disease. Destructive 3 x 5 cm lesion of the posterior right fifth rib (series 2, image 48). Lytic lesion of the posterior right seventh rib. Nearby mildly displaced right posterior lateral seventh rib fracture might be pathologic (series 5, image 77). 2.5 x 3.5 lytic metastasis anterior left third rib. Smaller lytic metastasis with pathologic fracture posterior left fifth rib (series 2, image 59). Mildly displaced probable pathologic fracture posterior lateral left third rib (image 43). Scattered vertebral metastases including multiple small lytic vertebral body metastases in the thoracic spine (e.g. T4 series 4, image 106). Larger lytic left T11 metastasis (series 2, image 125) without strong evidence of epidural extension. Similar lytic posterior element metastasis right L1  vertebra (series 2, image 154). IMPRESSION: 1. Constellation of abnormal posterior mediastinum and lytic osseous metastatic disease most compatible with stage IV esophageal carcinoma. 2. Distal third thoracic esophagus masslike soft tissue thickening and irregularity up to 2 cm. See sagittal image 100. Recommend EGD. 3. Multiple destructive rib metastases with pathologic fractures. Multiple thoracic and upper lumbar vertebral metastases, though none with suspected epidural extension at this time. Electronically Signed: By: Genevie Ann M.D. On: 02/09/2016 15:20   Ct Angio Chest Pe W And/or Wo Contrast  Addendum Date: 03/02/2016   ADDENDUM REPORT: 03/02/2016 20:59 ADDENDUM: There soft tissue densities outlined by contrast in the distal SVC and right atrium potentially representing thrombus as well. Electronically Signed   By: Ashley Royalty M.D.   On: 03/02/2016 20:59   Result Date: 03/02/2016 CLINICAL DATA:  Metastatic esophageal cancer with dyspnea. EXAM: CT ANGIOGRAPHY CHEST WITH CONTRAST TECHNIQUE: Multidetector CT imaging of the chest was performed using the standard protocol during bolus administration of intravenous contrast. Multiplanar CT image reconstructions and MIPs were obtained to evaluate the vascular anatomy. CONTRAST:  80 cc of Isovue 370 IV COMPARISON:  02/09/2016 chest CT FINDINGS: Cardiovascular: Acute bilateral lobar through segmental right upper, lobar through subsegmental right lower and left upper lobar through segmental pulmonary emboli are noted with right heart strain (RV/ LV ratio = 1). Calcified coronary atherosclerosis with minimal calcified aortic atherosclerosis. Top normal size cardiac chambers without pericardial effusion. Mediastinum/Nodes: Masslike abnormality in the distal esophagus is again noted involving the lower third with single wall thickness up to 2.1 cm unchanged in appearance. Associated posterior and subcarinal mediastinal lymphadenopathy is again identified without  significant change. Superimposed mild AP window adenopathy to 12 mm short axis, paraesophageal adenopathy to 14 mm short axis and cluster of subcarinal lymph nodes are again noted slightly more prominent in the subcarinal region with slight increase in number of lymph nodes suggested. Stable left para-aortic lymphadenopathy. Lungs/Pleura: Patchy ground-glass opacities are noted the visualized lung possibly representing hypoventilatory change. There is atelectasis at the right lung base. Upper Abdomen: The GE junction appears unremarkable. Stomach is contracted to the extent visible. 12 mm indeterminate left hepatic hypodensity is suggested on current exam not apparent on  previous. Musculoskeletal: Interval progression of osseous metastatic disease with interval increase in size of several lytic foci in particular the manubrium, right scapula, T2,T4, the T9 and T11 vertebral bodies. Suspicious for epidural involvement of the left at T11. Multiple osteolytic bony destruction of bilateral ribs soft tissue components. Review of the MIP images confirms the above findings. IMPRESSION: Positive for acute PE with CT evidence of right heart strain (RV/LV Ratio = 1) consistent with at least submassive (intermediate risk) PE. The presence of right heart strain has been associated with an increased risk of morbidity and mortality. Please activate Code PE by paging [phone]. Critical Value/emergent results were called by telephone at the time of interpretation on 03/02/2016 at 8:05 pm to Dr. Davonna Belling , who verbally acknowledged these results. Distal esophageal mass with progression of metastatic disease. Of note, suggestion of epidural involvement at T11 on the left. Electronically Signed: By: Ashley Royalty M.D. On: 03/02/2016 20:05   Mr Thoracic Spine W Wo Contrast  Result Date: 03/05/2016 CLINICAL DATA:  Metastatic esophageal cancer. EXAM: MRI THORACIC SPINE WITH CONTRAST TECHNIQUE: Multiplanar, multisequence MR  imaging of the thoracic spine was performed following the administration of intravenous contrast. COMPARISON:  Chest CT 03/02/2016 FINDINGS: Alignment:  Normal Vertebrae: Diffuse osseous metastatic disease involving the cervical, thoracic and upper lumbar spine. Numerous enhancing lesions throughout the spine. Cord:  No cord lesions or abnormal cord enhancement.  No syrinx. Paraspinal and other soft tissues: Extensive mediastinal disease is noted along with a distal esophageal mass. There is a large lesion associated with the fifth right rib with a large soft tissue component. Disc levels: The most significant thoracic disease is located at T11 on the left side. There is tumor in the posterior aspect of the vertebral body on the left side which extends into the expanded left pedicle and into the left neural foramen, left lateral spinal canal, lamina and transverse process. Epidural tumor is displacing the cord slightly to the right. There is also left-sided T12 tumor extending out through the lateral cortex of vertebral body and areas tumor in the right pedicle of L1. Multilevel shallow disc protrusions are noted. I do not see any other areas of spinal canal indication by tumor. IMPRESSION: 1. Diffuse osseous metastatic disease involving the cervical spine, thoracic spine and upper lumbar spine. 2. No cord lesions. 3. The most significant thoracic metastatic disease is located at T11 on the left side with there is extensive tumor in the posterior aspect of the 2 body, in the left pedicle can left lamina and transverse process. There is also tumor bulging slightly into the left neural foramen at T11-12. There is epidural tumor on left side and there is mild mass effect on the left side of the thecal sac. Electronically Signed   By: Marijo Sanes M.D.   On: 03/05/2016 19:42   Nm Bone Scan Whole Body  Result Date: 03/02/2016 CLINICAL DATA:  History of esophageal cancer.  Back and chest pain. EXAM: NUCLEAR MEDICINE  WHOLE BODY BONE SCAN TECHNIQUE: Whole body anterior and posterior images were obtained approximately 3 hours after intravenous injection of radiopharmaceutical. RADIOPHARMACEUTICALS:  26.0 mCi Technetium-52m MDP IV COMPARISON:  Abdomen and pelvis CT, 03/02/2016. Chest CT, 02/09/2016. FINDINGS: There multiple areas of abnormal radiotracer localization consistent with metastatic disease to bone, corresponding to lytic lesions noted on the prior CTs. Specifically, there are small foci of uptake in the calvarium. Abnormal uptake is seen in the sternum and involving multiple ribs as well as the  right scapula. There is uptake from the spine most prominently from the T10. More mild appearing uptake is noted in the pelvis. There is uptake involving the right mid femur. There also areas of degenerative uptake involving the sternoclavicular joints knees and feet. Renal uptake is symmetric. IMPRESSION: Multiple areas of abnormal uptake consistent with metastatic disease to bone, corresponding to lytic bone lesions noted on the recent chest and abdomen and pelvis CTs. Electronically Signed   By: Lajean Manes M.D.   On: 03/02/2016 16:40   Ct Abdomen Pelvis W Contrast  Result Date: 03/02/2016 CLINICAL DATA:  Patient with esophageal carcinoma. EXAM: CT ABDOMEN AND PELVIS WITH CONTRAST TECHNIQUE: Multidetector CT imaging of the abdomen and pelvis was performed using the standard protocol following bolus administration of intravenous contrast. CONTRAST:  100 cc Isovue-300 COMPARISON:  Chest CT 02/09/2016. FINDINGS: Lower chest: There is masslike thickening of the distal esophagus (image 4; series 201. Enlarged periaortic lymph nodes about the distal descending thoracic aorta measuring up to 1.4 cm (image 8; series 201). Suggestion of possible filling defect within a right lower lobe pulmonary artery (image 4; series 201). Additionally within the subpleural right lower lobe there is triangular-shaped ground-glass and  consolidative opacity (image 15; series 205). Hepatobiliary: Multiple low-attenuation lesions are demonstrated throughout the liver with a reference lesion in the left hepatic lobe measuring 1.2 cm (image 9; series 201) and a reference lesion within the inferior right hepatic lobe measuring 5.0 x 3.3 cm (image 35; series 201). Patient status post cholecystectomy. Pancreas: Within the pancreatic tail there is a 1.5 cm enhancing lesion (image 33; series 201). The remainder the pancreas is unremarkable. Spleen: Unremarkable Adrenals/Urinary Tract: Mild nodularity of the adrenal glands bilaterally. Kidneys enhance symmetrically with contrast. Urinary bladder is unremarkable. Too small to characterize subcentimeter partially exophytic lesion off the interpolar region of the right kidney (image 32; series 201). Stomach/Bowel: No evidence for bowel obstruction. Stool throughout the colon. No free fluid or free intraperitoneal air. Normal morphology of the stomach. Two adjacent prominent small bowel mesenteric lymph nodes measuring up to 10 mm (image 42; series 2 with 3), potentially reactive. Metastatic lesions not excluded. Vascular/Lymphatic: Normal caliber abdominal aorta. Peripheral calcified atherosclerotic plaque. Retroaortic left renal vein. Reproductive: Prostate is unremarkable. Other: Small bilateral fat containing inguinal hernias. Musculoskeletal: Multiple lytic lesions are demonstrated throughout the visualized ribs, thoracic spine, lumbar spine as well as the pelvis. There is a 4.5 x 2.2 cm lytic lesion with soft tissue mass involving the posterior aspect of the T11 vertebral body which appears to narrow the spinal canal (image 14; series 201). Reference 4.3 cm lytic lesion within the left ilium (image 64; series 201). IMPRESSION: Findings suggestive of embolus within the visualized right lower lobe pulmonary arteries. There is an adjacent triangular-shaped subpleural ground-glass and consolidative opacity  within the right lower lobe which is favored represent associated pulmonary infarct. Consider dedicated evaluation with CTA chest. Masslike thickening of the distal esophagus compatible with primary esophageal carcinoma. Multiple enlarged para-aortic lymph nodes compatible with metastatic disease. Additionally there are multiple metastatic lesions involving the liver and visualized skeleton. Note is made of a large lytic lesion with soft tissue mass involving the T11 vertebral body which appears to narrow the spinal canal at this level. Nonspecific enhancing soft tissue mass at the level of the distal pancreas which may represent enhancing primary pancreatic lesion versus splenule. Metastatic disease is considered less likely. Critical Value/emergent results were called by telephone at the time of interpretation on  03/02/2016 at 2:16 pm to Dr. Marin Olp, who verbally acknowledged these results. Electronically Signed   By: Lovey Newcomer M.D.   On: 03/02/2016 14:17   Ir US Guide Vasc Access Right  Result Date: 02/24/2016 CLINICAL DATA:  Metastatic esophageal cancer EXAM: RIGHT INTERNAL JUGULAR SINGLE LUMEN POWER PORT CATHETER INSERTION Date:  12/22/201712/22/2017 2:30 pm Radiologist:  M. Daryll Brod, MD Guidance:  Ultrasound and fluoroscopic MEDICATIONS: 2 g Ancef; The antibiotic was administered within an appropriate time interval prior to skin puncture. ANESTHESIA/SEDATION: Versed 4.0 mg IV; Fentanyl 100 mcg IV; Moderate Sedation Time:  27 minutes The patient was continuously monitored during the procedure by the interventional radiology nurse under my direct supervision. FLUOROSCOPY TIME:  36 seconds (16 mGy) COMPLICATIONS: None immediate. CONTRAST:  None. PROCEDURE: Informed consent was obtained from the patient following explanation of the procedure, risks, benefits and alternatives. The patient understands, agrees and consents for the procedure. All questions were addressed. A time out was performed. Maximal  barrier sterile technique utilized including caps, mask, sterile gowns, sterile gloves, large sterile drape, hand hygiene, and 2% chlorhexidine scrub. Under sterile conditions and local anesthesia, right internal jugular micropuncture venous access was performed. Access was performed with ultrasound. Images were obtained for documentation. A guide wire was inserted followed by a transitional dilator. This allowed insertion of a guide wire and catheter into the IVC. Measurements were obtained from the SVC / RA junction back to the right IJ venotomy site. In the right infraclavicular chest, a subcutaneous pocket was created over the second anterior rib. This was done under sterile conditions and local anesthesia. 1% lidocaine with epinephrine was utilized for this. A 2.5 cm incision was made in the skin. Blunt dissection was performed to create a subcutaneous pocket over the right pectoralis major muscle. The pocket was flushed with saline vigorously. There was adequate hemostasis. The port catheter was assembled and checked for leakage. The port catheter was secured in the pocket with two retention sutures. The tubing was tunneled subcutaneously to the right venotomy site and inserted into the SVC/RA junction through a valved peel-away sheath. Position was confirmed with fluoroscopy. Images were obtained for documentation. The patient tolerated the procedure well. No immediate complications. Incisions were closed in a two layer fashion with 4 - 0 Vicryl suture. Dermabond was applied to the skin. The port catheter was accessed, blood was aspirated followed by saline and heparin flushes. Needle was removed. A dry sterile dressing was applied. IMPRESSION: Ultrasound and fluoroscopically guided right internal jugular single lumen power port catheter insertion. Tip in the SVC/RA junction. Catheter ready for use. Electronically Signed   By: Jerilynn Mages.  Shick M.D.   On: 02/24/2016 14:34   Dg Shoulder Left  Result Date:  03/02/2016 CLINICAL DATA:  Felt pop at left shoulder when getting up. Initial encounter. EXAM: LEFT SHOULDER - 2+ VIEW COMPARISON:  None. FINDINGS: There is no evidence of fracture or dislocation. The left humeral head is seated within the glenoid fossa. The acromioclavicular joint is unremarkable in appearance. No significant soft tissue abnormalities are seen. The visualized portions of the left lung are clear. IMPRESSION: No evidence of fracture or dislocation. Electronically Signed   By: Garald Balding M.D.   On: 03/02/2016 19:55   Ir Fluoro Guide Port Insertion Right  Result Date: 02/24/2016 CLINICAL DATA:  Metastatic esophageal cancer EXAM: RIGHT INTERNAL JUGULAR SINGLE LUMEN POWER PORT CATHETER INSERTION Date:  12/22/201712/22/2017 2:30 pm Radiologist:  M. Daryll Brod, MD Guidance:  Ultrasound and fluoroscopic MEDICATIONS: 2  g Ancef; The antibiotic was administered within an appropriate time interval prior to skin puncture. ANESTHESIA/SEDATION: Versed 4.0 mg IV; Fentanyl 100 mcg IV; Moderate Sedation Time:  27 minutes The patient was continuously monitored during the procedure by the interventional radiology nurse under my direct supervision. FLUOROSCOPY TIME:  36 seconds (16 mGy) COMPLICATIONS: None immediate. CONTRAST:  None. PROCEDURE: Informed consent was obtained from the patient following explanation of the procedure, risks, benefits and alternatives. The patient understands, agrees and consents for the procedure. All questions were addressed. A time out was performed. Maximal barrier sterile technique utilized including caps, mask, sterile gowns, sterile gloves, large sterile drape, hand hygiene, and 2% chlorhexidine scrub. Under sterile conditions and local anesthesia, right internal jugular micropuncture venous access was performed. Access was performed with ultrasound. Images were obtained for documentation. A guide wire was inserted followed by a transitional dilator. This allowed insertion  of a guide wire and catheter into the IVC. Measurements were obtained from the SVC / RA junction back to the right IJ venotomy site. In the right infraclavicular chest, a subcutaneous pocket was created over the second anterior rib. This was done under sterile conditions and local anesthesia. 1% lidocaine with epinephrine was utilized for this. A 2.5 cm incision was made in the skin. Blunt dissection was performed to create a subcutaneous pocket over the right pectoralis major muscle. The pocket was flushed with saline vigorously. There was adequate hemostasis. The port catheter was assembled and checked for leakage. The port catheter was secured in the pocket with two retention sutures. The tubing was tunneled subcutaneously to the right venotomy site and inserted into the SVC/RA junction through a valved peel-away sheath. Position was confirmed with fluoroscopy. Images were obtained for documentation. The patient tolerated the procedure well. No immediate complications. Incisions were closed in a two layer fashion with 4 - 0 Vicryl suture. Dermabond was applied to the skin. The port catheter was accessed, blood was aspirated followed by saline and heparin flushes. Needle was removed. A dry sterile dressing was applied. IMPRESSION: Ultrasound and fluoroscopically guided right internal jugular single lumen power port catheter insertion. Tip in the SVC/RA junction. Catheter ready for use. Electronically Signed   By: Jerilynn Mages.  Shick M.D.   On: 02/24/2016 14:34       IMPRESSION/PLAN: 1. 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 Dr. Lisbeth Renshaw discusses the findings on the patient's imaging studies, taking into consideration his symptoms of back pain, and dysphagia. He recommends proceeding with radiotherapy to the thoracic spine from T4-L1 and to the esophagus. In the spine we anticipate the T11 findings stabilize and regress. We would also  recommend a course of dexamethasone to help protect his spinal cord from edema related to his disease. He also discusses the findings in the ribs, and reviews that these are also sites we could consider treating with radiotherapy. We discussed the risks, benefits, short, and long term effects of radiotherapy. He discusses the delivery and logistics of treatment. He would recommend a course of 15 fractions over 3 weeks. We have reviewed consent and he has signed this. He will proceed with simulation today, and begin radiotherapy next Monday. Dr. Burr Medico will be adding chemotherapy to his regimen as well with taxol and carboplatin.    The above documentation reflects my direct findings during this shared patient visit. Please see the separate note by Dr. Lisbeth Renshaw on this date for the remainder of the patient's  plan of care.    Carola Rhine, PAC

## 2016-03-09 DIAGNOSIS — Z510 Encounter for antineoplastic radiation therapy: Secondary | ICD-10-CM | POA: Diagnosis not present

## 2016-03-12 ENCOUNTER — Ambulatory Visit
Admission: RE | Admit: 2016-03-12 | Discharge: 2016-03-12 | Disposition: A | Discharge status: Home / Self Care | Payer: BLUE CROSS/BLUE SHIELD | Source: Ambulatory Visit | Attending: Radiation Oncology | Admitting: Radiation Oncology

## 2016-03-12 DIAGNOSIS — Z510 Encounter for antineoplastic radiation therapy: Secondary | ICD-10-CM | POA: Diagnosis not present

## 2016-03-12 DIAGNOSIS — C159 Malignant neoplasm of esophagus, unspecified: Secondary | ICD-10-CM | POA: Insufficient documentation

## 2016-03-12 MED ORDER — BIAFINE EX EMUL
Freq: Every day | CUTANEOUS | Status: DC
  Administered 2016-03-12: 15:00:00 via TOPICAL

## 2016-03-12 NOTE — Progress Notes (Signed)
Radiation therapy and you book given with my business card, biafine cream, discussed ways to manage side effects, fatigue, skin irritation, pain, irritation to his esophagus, t-4-t-11,  Difficulty swallowing, ,may need to eat 5-6 smaller softer foods, and snacks in between,, ensure or boost, increase high calorie drinks and protein in diet; apply biafine to treated area daily after treatment and bedtime,  May need carafte if patient feels food getting stuck or burning throat,  Education done, sees MD weekly and prn, teach back given 2:37 PM

## 2016-03-13 ENCOUNTER — Telehealth: Payer: Self-pay | Admitting: *Deleted

## 2016-03-13 ENCOUNTER — Ambulatory Visit
Admission: RE | Admit: 2016-03-13 | Discharge: 2016-03-13 | Disposition: A | Discharge status: Home / Self Care | Payer: BLUE CROSS/BLUE SHIELD | Source: Ambulatory Visit | Attending: Radiation Oncology | Admitting: Radiation Oncology

## 2016-03-13 DIAGNOSIS — Z510 Encounter for antineoplastic radiation therapy: Secondary | ICD-10-CM | POA: Diagnosis not present

## 2016-03-13 NOTE — Telephone Encounter (Signed)
Spoke with pt and informed him that chemo appt for Wed 03/14/16 will be cancelled.  Pt still needs to keep office visit appts as scheduled.  Informed pt that Dr. Burr Medico will discuss plan of care at next office visit.  Pt voiced understanding.  Chemo appt cancelled for 03/14/16.

## 2016-03-14 ENCOUNTER — Encounter: Payer: Self-pay | Admitting: *Deleted

## 2016-03-14 ENCOUNTER — Ambulatory Visit: Payer: BLUE CROSS/BLUE SHIELD | Admitting: Radiation Oncology

## 2016-03-14 ENCOUNTER — Ambulatory Visit: Payer: BLUE CROSS/BLUE SHIELD

## 2016-03-14 ENCOUNTER — Ambulatory Visit (HOSPITAL_BASED_OUTPATIENT_CLINIC_OR_DEPARTMENT_OTHER): Payer: BLUE CROSS/BLUE SHIELD

## 2016-03-14 ENCOUNTER — Other Ambulatory Visit (HOSPITAL_BASED_OUTPATIENT_CLINIC_OR_DEPARTMENT_OTHER): Payer: BLUE CROSS/BLUE SHIELD

## 2016-03-14 ENCOUNTER — Ambulatory Visit
Admission: RE | Admit: 2016-03-14 | Discharge: 2016-03-14 | Disposition: A | Discharge status: Home / Self Care | Payer: BLUE CROSS/BLUE SHIELD | Source: Ambulatory Visit | Attending: Radiation Oncology | Admitting: Radiation Oncology

## 2016-03-14 ENCOUNTER — Ambulatory Visit (HOSPITAL_BASED_OUTPATIENT_CLINIC_OR_DEPARTMENT_OTHER): Payer: BLUE CROSS/BLUE SHIELD | Admitting: Hematology

## 2016-03-14 ENCOUNTER — Encounter: Payer: Self-pay | Admitting: Hematology

## 2016-03-14 ENCOUNTER — Ambulatory Visit: Payer: BLUE CROSS/BLUE SHIELD | Admitting: Nutrition

## 2016-03-14 ENCOUNTER — Telehealth: Payer: Self-pay | Admitting: Hematology

## 2016-03-14 VITALS — BP 138/94 | HR 116 | Temp 98.3°F | Resp 18 | Ht 70.0 in | Wt 212.7 lb

## 2016-03-14 DIAGNOSIS — C7951 Secondary malignant neoplasm of bone: Secondary | ICD-10-CM | POA: Diagnosis not present

## 2016-03-14 DIAGNOSIS — C155 Malignant neoplasm of lower third of esophagus: Secondary | ICD-10-CM | POA: Diagnosis not present

## 2016-03-14 DIAGNOSIS — I2699 Other pulmonary embolism without acute cor pulmonale: Secondary | ICD-10-CM | POA: Diagnosis not present

## 2016-03-14 DIAGNOSIS — C159 Malignant neoplasm of esophagus, unspecified: Secondary | ICD-10-CM

## 2016-03-14 DIAGNOSIS — G893 Neoplasm related pain (acute) (chronic): Secondary | ICD-10-CM

## 2016-03-14 DIAGNOSIS — Z452 Encounter for adjustment and management of vascular access device: Secondary | ICD-10-CM

## 2016-03-14 DIAGNOSIS — Z7189 Other specified counseling: Secondary | ICD-10-CM

## 2016-03-14 DIAGNOSIS — Z510 Encounter for antineoplastic radiation therapy: Secondary | ICD-10-CM | POA: Diagnosis not present

## 2016-03-14 DIAGNOSIS — I10 Essential (primary) hypertension: Secondary | ICD-10-CM | POA: Diagnosis not present

## 2016-03-14 LAB — COMPREHENSIVE METABOLIC PANEL
ALBUMIN: 3.4 g/dL — AB (ref 3.5–5.0)
ALK PHOS: 144 U/L (ref 40–150)
ALT: 30 U/L (ref 0–55)
ANION GAP: 11 meq/L (ref 3–11)
AST: 24 U/L (ref 5–34)
BILIRUBIN TOTAL: 0.76 mg/dL (ref 0.20–1.20)
BUN: 22.8 mg/dL (ref 7.0–26.0)
CALCIUM: 10.9 mg/dL — AB (ref 8.4–10.4)
CO2: 31 mEq/L — ABNORMAL HIGH (ref 22–29)
CREATININE: 0.9 mg/dL (ref 0.7–1.3)
Chloride: 94 mEq/L — ABNORMAL LOW (ref 98–109)
EGFR: >90 mL/min/{1.73_m2} (ref >90)
Glucose: 137 mg/dl (ref 70–140)
Potassium: 3.5 mEq/L (ref 3.5–5.1)
Sodium: 136 mEq/L (ref 136–145)
Total Protein: 6.9 g/dL (ref 6.4–8.3)

## 2016-03-14 LAB — CBC WITH DIFFERENTIAL/PLATELET
BASO%: 0 % (ref 0.0–2.0)
Basophils Absolute: 0 10*3/uL (ref 0.0–0.1)
EOS ABS: 0 10*3/uL (ref 0.0–0.5)
EOS%: 0 % (ref 0.0–7.0)
HEMATOCRIT: 37 % — AB (ref 38.4–49.9)
HEMOGLOBIN: 13.1 g/dL (ref 13.0–17.1)
LYMPH#: 0.7 10*3/uL — AB (ref 0.9–3.3)
LYMPH%: 8.1 % — AB (ref 14.0–49.0)
MCH: 30.8 pg (ref 27.2–33.4)
MCHC: 35.4 g/dL (ref 32.0–36.0)
MCV: 87.1 fL (ref 79.3–98.0)
MONO#: 0.7 10*3/uL (ref 0.1–0.9)
MONO%: 8.4 % (ref 0.0–14.0)
NEUT%: 83.5 % — AB (ref 39.0–75.0)
NEUTROS ABS: 6.8 10*3/uL — AB (ref 1.5–6.5)
PLATELETS: 326 10*3/uL (ref 140–400)
RBC: 4.25 10*6/uL (ref 4.20–5.82)
RDW: 12.9 % (ref 11.0–14.6)
WBC: 8.1 10*3/uL (ref 4.0–10.3)
nRBC: 0 % (ref 0–0)

## 2016-03-14 MED ORDER — HEPARIN SOD (PORK) LOCK FLUSH 100 UNIT/ML IV SOLN
500.0000 [IU] | Freq: Once | INTRAVENOUS | Status: AC | PRN
Start: 2016-03-14 — End: 2016-03-14
  Administered 2016-03-14: 500 [IU]
  Filled 2016-03-14: qty 5

## 2016-03-14 MED ORDER — SODIUM CHLORIDE 0.9% FLUSH
10.0000 mL | INTRAVENOUS | Status: DC | PRN
  Administered 2016-03-14: 10 mL
  Filled 2016-03-14: qty 10

## 2016-03-14 NOTE — Telephone Encounter (Signed)
Spoke with patient re lab/fu 1/25. Message to desk nurse to see if patient will need tx when he returns on 1/25. Chemo today cxd and not mention in 1/10 los if proceeding with chemo in 2 weeks.

## 2016-03-14 NOTE — Progress Notes (Signed)
Nutrition follow-up completed with patient receiving radiation therapy for esophageal cancer. Patient reports chemotherapy on hold until he finishes radiation treatments. Weight decreased and documented as 212.7 pounds January 10, down from 228 pounds December 19 Patient reports he is eating more volume and more often than he was secondary to easier swallowing. He drinks one oral nutrition supplement daily. Reports tolerating soft moist foods.  Nutrition diagnosis: Unintended weight loss continues.  Intervention: Educated patient to increase oral nutrition supplements twice a day between meals. Educated patient on increasing volume at mealtimes using high-calorie, high-protein foods. Provided fact sheet on soft moist protein foods. Provided samples and coupons of oral nutrition supplements. Questions were answered.  Teach back method used.  Monitoring, evaluation, goals:  Patient will tolerate increased calories and protein to minimize further weight loss.  Next visit: To be scheduled.  **Disclaimer: This note was dictated with voice recognition software. Similar sounding words can inadvertently be transcribed and this note may contain transcription errors which may not have been corrected upon publication of note.**

## 2016-03-14 NOTE — Progress Notes (Signed)
[place] Work  Clinical Social Work was referred by patient for assistance with disability questions..  Clinical Social Worker met with patient at [facility] to offer support and assess for needs.  CSW explained how STD/LTD work and ss disability. Pt plans to go ahead and work with his HR and social security to move ahead with this process. He also plans to inquire about cobra coverage as he will not be eligible for medicare until after two years on disability. Pt in good spirits today, calmer and eager to get his plans in place. CSW will continue to follow and assist.   Clinical Social Work interventions:  Resource education Supportive listening  Loren Racer, [place]  [place] Phone: [phone] Fax: [phone]

## 2016-03-14 NOTE — Progress Notes (Signed)
Walter Craig  [phone] [phone]  Clinic follow Up Note   Patient Care Team: Hulan Fess, MD as PCP - General (Family Medicine) Wonda Horner, MD as Consulting Physician (Gastroenterology) Truitt Merle, MD as Consulting Physician (Hematology) 03/14/2016   Oncology History   Esophageal cancer, stage IV [facility])   Staging form: Esophagus - Adenocarcinoma, AJCC 8th Edition   - Clinical: Stage IVB (cTX, cNX, cM1) - Signed by Truitt Merle, MD on 03/15/2016      Esophageal cancer, stage IV ([place])   02/09/2016 Imaging    CT chest with contrast showed constellation of abnormal posterior mediastinum and lytic osseous metastatic disease most compatible with stage IV esophageal cancer, distal thoracic esophagus masslike soft tissue thickening and irregularity up to 2 cm. Multiple destructive rib metastasis with pathological fractures. Multiple thoracic and upper lumbar vertebral metastasis, without suspected epidural extension.      02/15/2016 Procedure     esophageal mucosal change consistent with long segment Barrett's esophagus, a large malignant mass was found in the distal esophagus, partially obstructing and 3/4 circumferential.      02/15/2016 Initial Biopsy    Distal esophageal mass biopsy showed adenocarcinoma, arising in the background of intestinal metaplasia associate with high-grade granular dysplasia. Positive for lymphovascular invasion.      02/15/2016 Initial Diagnosis    Esophageal cancer, stage IV ([place])      03/01/2016 -  Chemotherapy    FOLFOX every 2 weeks        03/02/2016 Imaging    CT and her chest showed acute pulmonary embolism in bilateral lobar through segmental right upper, right low and left upper pulmonary arteries with CT evidence of right heart strain, consistent with at least submassive PE      03/05/2016 Imaging    MR Thoracic spine with/without contrast 1. Diffuse osseous metastatic disease involving the cervical spine,  thoracic spine and upper lumber spine. 2. No cord lesions. 3. The most significant thoracic metastatic disease is located at T11 on the left side where there is extensive tumor in the posterior aspect of the 2 body, in the left pedicle can left lamina and transverse process. There is also tumor bulging slightly into the left neural foramen at T11-12. There is epidural tumor on left side and there is mild mass effect on the left side of the thecal sac.      03/09/2016 Miscellaneous    Insufficient tissue for Her-2 testing. 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 male is here because of His recently diagnosed metastatic esophageal cancer. He is accompanied by his sister-in-law to my clinic today. His daughter was on the phone during the clinic visit also.  He presented with bilateral chest and back pain in early 02/07/23, after his brother's death. He was seen by his PCP. He had CXR which was negative and he took NSAIDs for the pain. He was referred to PT, bu this pain did not improve. A CT scan was subsequently obtained, which showed distal surgery. Thoracic esophagus masslike soft tissue thickening and irregularity up to his 2 cm, multiple mediastinal and osseous metastasis compatible with stage IV esophageal cancer. He was referred to S retinologist Dr. Penelope Coop, underwent EGD  on 02/15/2016 which showed esophageal mucosal change consistent with long segment Barrett's esophagus, a large malignant mass was found in the distal esophagus, partially obstructing and 3/4 circumferential. Biopsies showed invasive adenocarcinoma.  He denies any dysphagia, he recently developed mid chest pain when he eats lately, better with soft and liquid diet. Pain 9/10, he takes hydrocordone 2 tab  a day, not well controlled. He still works, he is a Chiropractor, desk job   He had not had BM for 8 days, probably secondary to his pain medication.  He has good appetite, but eats less due to dysphagea, he lost about 20 lbs in the past few months   He has three chidren, his daughter lives in [place], 2 sons living in [place]. His brother Walter Craig recently died from clinical carcinoma, and Walter Craig's wife is currently receiving Keytruda for her cholangiocarcinoma.  CURRENT THERAPY:  1. first line chemo FOLFOX started on 02/29/2016, held afterwards due to radiation 2. Palliative radiation to esophageal cancer and T10-11 bone metastasis  INTERIM HISTORY: Mr. Gentles returns for follow up. He presents to the clinic by himself. He states his pain has been much better controlled, he has been taking MS Contin 3 times a day, but has not needed morphine IR much. He has started palliative radiation 2 days ago, has been tolerating well so far. He has a mild sore throat, dysphasia has slightly improved, he is tolerating soft diet very well. He still goes to work, but wants to apply for disability.  No other new complaints.   MEDICAL HISTORY:  Past Medical History:  Diagnosis Date  . Anxiety   . 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 Cancer ([place]) 02/2016   low esophagus cancer, mets   . Depression   . Depression with anxiety   . Esophageal cancer ([place]) 02/24/2016   invasive adenocarcinoma  . HLD (hyperlipidemia)   . Hypertension   . Pulmonary embolism ([place])   . Sleep apnea     SURGICAL HISTORY: Past Surgical History:  Procedure Laterality Date  . CHOLECYSTECTOMY    . IR GENERIC HISTORICAL  02/24/2016   IR US GUIDE VASC ACCESS RIGHT 02/24/2016 Greggory Keen, MD [facility]-INTERV RAD  . IR GENERIC HISTORICAL  02/24/2016   IR FLUORO GUIDE PORT INSERTION RIGHT 02/24/2016 Greggory Keen, MD [facility]-INTER[facility]  . left knee meniscus repare       SOCIAL HISTORY: Social History   Social History   . Marital status: Married    Spouse name: N/A  . Number of children: N/A  . Years of education: N/A   Occupational History  . Not on file.   Social History Main Topics  . Smoking status: Former Smoker    Packs/day: 1.50    Years: 30.00    Quit date: 03/05/2004  . Smokeless tobacco: Never Used  . Alcohol use 2.4 - 3.0 oz/week    1 Shots of liquor, 3 - 4 Cans of beer per week     Comment: daily   . Drug use:     Types: Marijuana     Comment: he reports random use  . Sexual activity: Not on file   Other Topics Concern  . 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 Cancer Father  colon cancer   . Cancer Sister     colon cancer   . Cancer Brother 36    gallbladder cancer     ALLERGIES:  has No Known Allergies.  MEDICATIONS:  Current Outpatient Prescriptions  Medication Sig Dispense Refill  . ALPRAZolam (XANAX) 0.5 MG tablet Take 0.5 mg by mouth at bedtime as needed for sleep.    . cyclobenzaprine (FLEXERIL) 5 MG tablet Take 1-2 tablets (5-10 mg total) by mouth 3 (three) times daily as needed for muscle spasms. 60 tablet 0  . dexamethasone (DECADRON) 4 MG tablet Take 0.5 tablets (2 mg total) by mouth 2 (two) times daily. 30 tablet 0  . diclofenac (VOLTAREN) 75 MG EC tablet Take 75 mg by mouth 2 (two) times daily as needed (pain/inflammation).   1  . emollient (BIAFINE) cream Apply 1 application topically 2 (two) times daily.    . fluticasone (FLONASE) 50 MCG/ACT nasal spray Place 1 spray into both nostrils daily as needed (congestion).    . hydrochlorothiazide (HYDRODIURIL) 25 MG tablet Take 25 mg by mouth daily.  1  . lidocaine-prilocaine (EMLA) cream Apply to affected area once (Patient taking differently: Apply 1 application topically once as needed (prior to port access). ) 30 g 3  . metoprolol succinate  (TOPROL-XL) 25 MG 24 hr tablet Take 25 mg by mouth daily.   1  . morphine (MS CONTIN) 15 MG 12 hr tablet Take 1 tablet (15 mg total) by mouth every 8 (eight) hours. 90 tablet 0  . Morphine Sulfate (MORPHINE CONCENTRATE) 10 MG/0.5ML SOLN concentrated solution Take 0.25-0.5 mLs (5-10 mg total) by mouth every 4 (four) hours as needed for severe pain. 30 mL 0  . ondansetron (ZOFRAN) 8 MG tablet Take 1 tablet (8 mg total) by mouth 2 (two) times daily as needed for refractory nausea / vomiting. Start on day 3 after chemotherapy. (Patient not taking: Reported on 03/08/2016) 30 tablet 1  . pravastatin (PRAVACHOL) 80 MG tablet Take 80 mg by mouth daily.    . prochlorperazine (COMPAZINE) 10 MG tablet Take 1 tablet (10 mg total) by mouth every 6 (six) hours as needed (Nausea or vomiting). (Patient not taking: Reported on 03/08/2016) 30 tablet 1  . Rivaroxaban 15 & 20 MG TBPK Take as directed on package: Start with one '15mg'$  tablet by mouth twice a day with food. On Day 22, switch to one '20mg'$  tablet once a day with food. 51 each 0  . zolpidem (AMBIEN) 10 MG tablet Take 10 mg by mouth at bedtime.   0   No current facility-administered medications for this visit.     REVIEW OF SYSTEMS:   Constitutional: Denies fevers, chills or abnormal night sweats (+) weight loss Eyes: Denies blurriness of vision, double vision or watery eyes Ears, nose, mouth, throat, and face: Denies mucositis or sore throat (+) dysphagia and feeling full quickly Respiratory: Denies cough, dyspnea or wheezes Cardiovascular: Denies palpitation, chest discomfort or lower extremity swelling Gastrointestinal:  Denies nausea, heartburn or change in bowel habits Skin: Denies abnormal skin rashes Musculoskeletal: (+) back pain Lymphatics: Denies new lymphadenopathy or easy bruising Neurological:Denies numbness, tingling or new weaknesses Behavioral/Psych: Mood is stable, no new changes  All other systems were reviewed with the patient and are  negative.  PHYSICAL EXAMINATION: ECOG PERFORMANCE STATUS: 1 - Symptomatic but completely ambulatory  Vitals:   03/14/16 1121  BP: (!) 138/94  Pulse: (!) 116  Resp: 18  Temp: 98.3 F (36.8 C)   Filed Weights  03/14/16 1121  Weight: 212 lb 11.2 oz (96.5 kg)    GENERAL:alert, no distress and comfortable SKIN: skin color, texture, turgor are normal, no rashes or significant lesions EYES: normal, conjunctiva are pink and non-injected, sclera clear OROPHARYNX:no exudate, no erythema and lips, buccal mucosa, and tongue normal  NECK: supple, thyroid normal size, non-tender, without nodularity LYMPH:  no palpable lymphadenopathy in the cervical, axillary or inguinal LUNGS: clear to auscultation and percussion with normal breathing effort HEART: regular rate & rhythm and no murmurs and no lower extremity edema ABDOMEN:abdomen soft, non-tender and normal bowel sounds Musculoskeletal:no cyanosis of digits and no clubbing  PSYCH: alert & oriented x 3 with fluent speech NEURO: no focal motor/sensory deficits  LABORATORY DATA:  I have reviewed the data as listed CBC Latest Ref Rng & Units 03/14/2016 03/06/2016 03/05/2016  WBC 4.0 - 10.3 10e3/uL 8.1 10.0 9.6  Hemoglobin 13.0 - 17.1 g/dL 13.1 12.1(L) 12.1(L)  Hematocrit 38.4 - 49.9 % 37.0(L) 34.7(L) 36.0(L)  Platelets 140 - 400 10e3/uL 326 168 267     CMP Latest Ref Rng & Units 03/14/2016 03/06/2016 03/04/2016  Glucose 70 - 140 mg/dl 137 105(H) 103(H)  BUN 7.0 - 26.0 mg/dL 22.'8 14 16  '$ Creatinine 0.7 - 1.3 mg/dL 0.9 0.89 0.93  Sodium 136 - 145 mEq/L 136 132(L) 135  Potassium 3.5 - 5.1 mEq/L 3.5 3.7 3.4(L)  Chloride 101 - 111 mmol/L - 97(L) 99(L)  CO2 22 - 29 mEq/L 31(H) 24 29  Calcium 8.4 - 10.4 mg/dL 10.9(H) 8.8(L) 8.3(L)  Total Protein 6.4 - 8.3 g/dL 6.9 - -  Total Bilirubin 0.20 - 1.20 mg/dL 0.76 - -  Alkaline Phos 40 - 150 U/L 144 - -  AST 5 - 34 U/L 24 - -  ALT 0 - 55 U/L 30 - -    PATHOLOGY REPORT  Diagnosis  02/15/2016 Adenocarcinoma present involving at least laminal propria and arising in background of intestinal metaplasia associated high-grade granular dysplasia. Positive for lymphovascular invasion.   RADIOGRAPHIC STUDIES: I have personally reviewed the radiological images as listed and agreed with the findings in the report. Ct Angio Chest Pe W And/or Wo Contrast  Addendum Date: 03/02/2016   ADDENDUM REPORT: 03/02/2016 20:59 ADDENDUM: There soft tissue densities outlined by contrast in the distal SVC and right atrium potentially representing thrombus as well. Electronically Signed   By: Ashley Royalty M.D.   On: 03/02/2016 20:59   Result Date: 03/02/2016 CLINICAL DATA:  Metastatic esophageal cancer with dyspnea. EXAM: CT ANGIOGRAPHY CHEST WITH CONTRAST TECHNIQUE: Multidetector CT imaging of the chest was performed using the standard protocol during bolus administration of intravenous contrast. Multiplanar CT image reconstructions and MIPs were obtained to evaluate the vascular anatomy. CONTRAST:  80 cc of Isovue 370 IV COMPARISON:  02/09/2016 chest CT FINDINGS: Cardiovascular: Acute bilateral lobar through segmental right upper, lobar through subsegmental right lower and left upper lobar through segmental pulmonary emboli are noted with right heart strain (RV/ LV ratio = 1). Calcified coronary atherosclerosis with minimal calcified aortic atherosclerosis. Top normal size cardiac chambers without pericardial effusion. Mediastinum/Nodes: Masslike abnormality in the distal esophagus is again noted involving the lower third with single wall thickness up to 2.1 cm unchanged in appearance. Associated posterior and subcarinal mediastinal lymphadenopathy is again identified without significant change. Superimposed mild AP window adenopathy to 12 mm short axis, paraesophageal adenopathy to 14 mm short axis and cluster of subcarinal lymph nodes are again noted slightly more prominent in the subcarinal region with  slight increase in number of lymph nodes suggested. Stable left para-aortic lymphadenopathy. Lungs/Pleura: Patchy ground-glass opacities are noted the visualized lung possibly representing hypoventilatory change. There is atelectasis at the right lung base. Upper Abdomen: The GE junction appears unremarkable. Stomach is contracted to the extent visible. 12 mm indeterminate left hepatic hypodensity is suggested on current exam not apparent on previous. Musculoskeletal: Interval progression of osseous metastatic disease with interval increase in size of several lytic foci in particular the manubrium, right scapula, T2,T4, the T9 and T11 vertebral bodies. Suspicious for epidural involvement of the left at T11. Multiple osteolytic bony destruction of bilateral ribs soft tissue components. Review of the MIP images confirms the above findings. IMPRESSION: Positive for acute PE with CT evidence of right heart strain (RV/LV Ratio = 1) consistent with at least submassive (intermediate risk) PE. The presence of right heart strain has been associated with an increased risk of morbidity and mortality. Please activate Code PE by paging [phone]. Critical Value/emergent results were called by telephone at the time of interpretation on 03/02/2016 at 8:05 pm to Dr. Davonna Belling , who verbally acknowledged these results. Distal esophageal mass with progression of metastatic disease. Of note, suggestion of epidural involvement at T11 on the left. Electronically Signed: By: Ashley Royalty M.D. On: 03/02/2016 20:05   Mr Thoracic Spine W Wo Contrast  Result Date: 03/05/2016 CLINICAL DATA:  Metastatic esophageal cancer. EXAM: MRI THORACIC SPINE WITH CONTRAST TECHNIQUE: Multiplanar, multisequence MR imaging of the thoracic spine was performed following the administration of intravenous contrast. COMPARISON:  Chest CT 03/02/2016 FINDINGS: Alignment:  Normal Vertebrae: Diffuse osseous metastatic disease involving the cervical,  thoracic and upper lumbar spine. Numerous enhancing lesions throughout the spine. Cord:  No cord lesions or abnormal cord enhancement.  No syrinx. Paraspinal and other soft tissues: Extensive mediastinal disease is noted along with a distal esophageal mass. There is a large lesion associated with the fifth right rib with a large soft tissue component. Disc levels: The most significant thoracic disease is located at T11 on the left side. There is tumor in the posterior aspect of the vertebral body on the left side which extends into the expanded left pedicle and into the left neural foramen, left lateral spinal canal, lamina and transverse process. Epidural tumor is displacing the cord slightly to the right. There is also left-sided T12 tumor extending out through the lateral cortex of vertebral body and areas tumor in the right pedicle of L1. Multilevel shallow disc protrusions are noted. I do not see any other areas of spinal canal indication by tumor. IMPRESSION: 1. Diffuse osseous metastatic disease involving the cervical spine, thoracic spine and upper lumbar spine. 2. No cord lesions. 3. The most significant thoracic metastatic disease is located at T11 on the left side with there is extensive tumor in the posterior aspect of the 2 body, in the left pedicle can left lamina and transverse process. There is also tumor bulging slightly into the left neural foramen at T11-12. There is epidural tumor on left side and there is mild mass effect on the left side of the thecal sac. Electronically Signed   By: Marijo Sanes M.D.   On: 03/05/2016 19:42   Nm Bone Scan Whole Body  Result Date: 03/02/2016 CLINICAL DATA:  History of esophageal cancer.  Back and chest pain. EXAM: NUCLEAR MEDICINE WHOLE BODY BONE SCAN TECHNIQUE: Whole body anterior and posterior images were obtained approximately 3 hours after intravenous injection of radiopharmaceutical. RADIOPHARMACEUTICALS:  26.0 mCi Technetium-80m  MDP IV COMPARISON:   Abdomen and pelvis CT, 03/02/2016. Chest CT, 02/09/2016. FINDINGS: There multiple areas of abnormal radiotracer localization consistent with metastatic disease to bone, corresponding to lytic lesions noted on the prior CTs. Specifically, there are small foci of uptake in the calvarium. Abnormal uptake is seen in the sternum and involving multiple ribs as well as the right scapula. There is uptake from the spine most prominently from the T10. More mild appearing uptake is noted in the pelvis. There is uptake involving the right mid femur. There also areas of degenerative uptake involving the sternoclavicular joints knees and feet. Renal uptake is symmetric. IMPRESSION: Multiple areas of abnormal uptake consistent with metastatic disease to bone, corresponding to lytic bone lesions noted on the recent chest and abdomen and pelvis CTs. Electronically Signed   By: Amie Portland M.D.   On: 03/02/2016 16:40   Ct Abdomen Pelvis W Contrast  Result Date: 03/02/2016 CLINICAL DATA:  Patient with esophageal carcinoma. EXAM: CT ABDOMEN AND PELVIS WITH CONTRAST TECHNIQUE: Multidetector CT imaging of the abdomen and pelvis was performed using the standard protocol following bolus administration of intravenous contrast. CONTRAST:  100 cc Isovue-300 COMPARISON:  Chest CT 02/09/2016. FINDINGS: Lower chest: There is masslike thickening of the distal esophagus (image 4; series 201. Enlarged periaortic lymph nodes about the distal descending thoracic aorta measuring up to 1.4 cm (image 8; series 201). Suggestion of possible filling defect within a right lower lobe pulmonary artery (image 4; series 201). Additionally within the subpleural right lower lobe there is triangular-shaped ground-glass and consolidative opacity (image 15; series 205). Hepatobiliary: Multiple low-attenuation lesions are demonstrated throughout the liver with a reference lesion in the left hepatic lobe measuring 1.2 cm (image 9; series 201) and a reference  lesion within the inferior right hepatic lobe measuring 5.0 x 3.3 cm (image 35; series 201). Patient status post cholecystectomy. Pancreas: Within the pancreatic tail there is a 1.5 cm enhancing lesion (image 33; series 201). The remainder the pancreas is unremarkable. Spleen: Unremarkable Adrenals/Urinary Tract: Mild nodularity of the adrenal glands bilaterally. Kidneys enhance symmetrically with contrast. Urinary bladder is unremarkable. Too small to characterize subcentimeter partially exophytic lesion off the interpolar region of the right kidney (image 32; series 201). Stomach/Bowel: No evidence for bowel obstruction. Stool throughout the colon. No free fluid or free intraperitoneal air. Normal morphology of the stomach. Two adjacent prominent small bowel mesenteric lymph nodes measuring up to 10 mm (image 42; series 2 with 3), potentially reactive. Metastatic lesions not excluded. Vascular/Lymphatic: Normal caliber abdominal aorta. Peripheral calcified atherosclerotic plaque. Retroaortic left renal vein. Reproductive: Prostate is unremarkable. Other: Small bilateral fat containing inguinal hernias. Musculoskeletal: Multiple lytic lesions are demonstrated throughout the visualized ribs, thoracic spine, lumbar spine as well as the pelvis. There is a 4.5 x 2.2 cm lytic lesion with soft tissue mass involving the posterior aspect of the T11 vertebral body which appears to narrow the spinal canal (image 14; series 201). Reference 4.3 cm lytic lesion within the left ilium (image 64; series 201). IMPRESSION: Findings suggestive of embolus within the visualized right lower lobe pulmonary arteries. There is an adjacent triangular-shaped subpleural ground-glass and consolidative opacity within the right lower lobe which is favored represent associated pulmonary infarct. Consider dedicated evaluation with CTA chest. Masslike thickening of the distal esophagus compatible with primary esophageal carcinoma. Multiple enlarged  para-aortic lymph nodes compatible with metastatic disease. Additionally there are multiple metastatic lesions involving the liver and visualized skeleton. Note is made of a large  lytic lesion with soft tissue mass involving the T11 vertebral body which appears to narrow the spinal canal at this level. Nonspecific enhancing soft tissue mass at the level of the distal pancreas which may represent enhancing primary pancreatic lesion versus splenule. Metastatic disease is considered less likely. Critical Value/emergent results were called by telephone at the time of interpretation on 03/02/2016 at 2:16 pm to Dr. Myna Hidalgo, who verbally acknowledged these results. Electronically Signed   By: Annia Belt M.D.   On: 03/02/2016 14:17   Ir US Guide Vasc Access Right  Result Date: 02/24/2016 CLINICAL DATA:  Metastatic esophageal cancer EXAM: RIGHT INTERNAL JUGULAR SINGLE LUMEN POWER PORT CATHETER INSERTION Date:  12/22/201712/22/2017 2:30 pm Radiologist:  M. Ruel Favors, MD Guidance:  Ultrasound and fluoroscopic MEDICATIONS: 2 g Ancef; The antibiotic was administered within an appropriate time interval prior to skin puncture. ANESTHESIA/SEDATION: Versed 4.0 mg IV; Fentanyl 100 mcg IV; Moderate Sedation Time:  27 minutes The patient was continuously monitored during the procedure by the interventional radiology nurse under my direct supervision. FLUOROSCOPY TIME:  36 seconds (16 mGy) COMPLICATIONS: None immediate. CONTRAST:  None. PROCEDURE: Informed consent was obtained from the patient following explanation of the procedure, risks, benefits and alternatives. The patient understands, agrees and consents for the procedure. All questions were addressed. A time out was performed. Maximal barrier sterile technique utilized including caps, mask, sterile gowns, sterile gloves, large sterile drape, hand hygiene, and 2% chlorhexidine scrub. Under sterile conditions and local anesthesia, right internal jugular micropuncture  venous access was performed. Access was performed with ultrasound. Images were obtained for documentation. A guide wire was inserted followed by a transitional dilator. This allowed insertion of a guide wire and catheter into the IVC. Measurements were obtained from the SVC / RA junction back to the right IJ venotomy site. In the right infraclavicular chest, a subcutaneous pocket was created over the second anterior rib. This was done under sterile conditions and local anesthesia. 1% lidocaine with epinephrine was utilized for this. A 2.5 cm incision was made in the skin. Blunt dissection was performed to create a subcutaneous pocket over the right pectoralis major muscle. The pocket was flushed with saline vigorously. There was adequate hemostasis. The port catheter was assembled and checked for leakage. The port catheter was secured in the pocket with two retention sutures. The tubing was tunneled subcutaneously to the right venotomy site and inserted into the SVC/RA junction through a valved peel-away sheath. Position was confirmed with fluoroscopy. Images were obtained for documentation. The patient tolerated the procedure well. No immediate complications. Incisions were closed in a two layer fashion with 4 - 0 Vicryl suture. Dermabond was applied to the skin. The port catheter was accessed, blood was aspirated followed by saline and heparin flushes. Needle was removed. A dry sterile dressing was applied. IMPRESSION: Ultrasound and fluoroscopically guided right internal jugular single lumen power port catheter insertion. Tip in the SVC/RA junction. Catheter ready for use. Electronically Signed   By: Judie Petit.  Shick M.D.   On: 02/24/2016 14:34   Dg Shoulder Left  Result Date: 03/02/2016 CLINICAL DATA:  Felt pop at left shoulder when getting up. Initial encounter. EXAM: LEFT SHOULDER - 2+ VIEW COMPARISON:  None. FINDINGS: There is no evidence of fracture or dislocation. The left humeral head is seated within the  glenoid fossa. The acromioclavicular joint is unremarkable in appearance. No significant soft tissue abnormalities are seen. The visualized portions of the left lung are clear. IMPRESSION: No evidence of fracture or  dislocation. Electronically Signed   By: Garald Balding M.D.   On: 03/02/2016 19:55   Ir Fluoro Guide Port Insertion Right  Result Date: 02/24/2016 CLINICAL DATA:  Metastatic esophageal cancer EXAM: RIGHT INTERNAL JUGULAR SINGLE LUMEN POWER PORT CATHETER INSERTION Date:  12/22/201712/22/2017 2:30 pm Radiologist:  M. Daryll Brod, MD Guidance:  Ultrasound and fluoroscopic MEDICATIONS: 2 g Ancef; The antibiotic was administered within an appropriate time interval prior to skin puncture. ANESTHESIA/SEDATION: Versed 4.0 mg IV; Fentanyl 100 mcg IV; Moderate Sedation Time:  27 minutes The patient was continuously monitored during the procedure by the interventional radiology nurse under my direct supervision. FLUOROSCOPY TIME:  36 seconds (16 mGy) COMPLICATIONS: None immediate. CONTRAST:  None. PROCEDURE: Informed consent was obtained from the patient following explanation of the procedure, risks, benefits and alternatives. The patient understands, agrees and consents for the procedure. All questions were addressed. A time out was performed. Maximal barrier sterile technique utilized including caps, mask, sterile gowns, sterile gloves, large sterile drape, hand hygiene, and 2% chlorhexidine scrub. Under sterile conditions and local anesthesia, right internal jugular micropuncture venous access was performed. Access was performed with ultrasound. Images were obtained for documentation. A guide wire was inserted followed by a transitional dilator. This allowed insertion of a guide wire and catheter into the IVC. Measurements were obtained from the SVC / RA junction back to the right IJ venotomy site. In the right infraclavicular chest, a subcutaneous pocket was created over the second anterior rib. This was  done under sterile conditions and local anesthesia. 1% lidocaine with epinephrine was utilized for this. A 2.5 cm incision was made in the skin. Blunt dissection was performed to create a subcutaneous pocket over the right pectoralis major muscle. The pocket was flushed with saline vigorously. There was adequate hemostasis. The port catheter was assembled and checked for leakage. The port catheter was secured in the pocket with two retention sutures. The tubing was tunneled subcutaneously to the right venotomy site and inserted into the SVC/RA junction through a valved peel-away sheath. Position was confirmed with fluoroscopy. Images were obtained for documentation. The patient tolerated the procedure well. No immediate complications. Incisions were closed in a two layer fashion with 4 - 0 Vicryl suture. Dermabond was applied to the skin. The port catheter was accessed, blood was aspirated followed by saline and heparin flushes. Needle was removed. A dry sterile dressing was applied. IMPRESSION: Ultrasound and fluoroscopically guided right internal jugular single lumen power port catheter insertion. Tip in the SVC/RA junction. Catheter ready for use. Electronically Signed   By: Jerilynn Mages.  Shick M.D.   On: 02/24/2016 14:34   MR thoracic spine with / without contrast 03/05/2016 IMPRESSION: 1. Diffuse osseous metastatic disease involving the cervical spine, thoracic spine and upper lumbar spine. 2. No cord lesions. 3. The most significant thoracic metastatic disease is located at T11 on the left side with there is extensive tumor in the posterior aspect of the 2 body, in the left pedicle can left lamina and transverse process. There is also tumor bulging slightly into the left neural foramen at T11-12. There is epidural tumor on left side and there is mild mass effect on the left side of the thecal sac.  ASSESSMENT & PLAN:  61 y.o. Caucasian male, with past medical history of hypertension, presented with  bilateral chest pain and dysphagia  1. 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 -His restaging CT scan showed significant disease progression in his bones, especially the T 11 lesion. I reviewed the CT and MRI scan in our GI tumor Board this morning, and discussed with radiation oncologist Dr. Lisbeth Renshaw who recommends palliative radiation to his T11 lesion as soon as possible, to prevent cord compression. -He has started palliative radiation, including the primary tumor, for his dysphagia -He has started systemic chemotherapy FOLFOX, first cycle tolerate well. Due to his current radiation, which is scheduled to complete in 3 weeks, I'll hold his chemotherapy for now. Due to the short course of radiation and palliative goal of care, I do not think he needs concurrent chemoirradiation. -I plan to restart chemotherapy FOLFOX after he completes radiation.  2. Bilateral acute submassive PE  -This was discovered on his staging CT scan, he was not much symptomatic -His oxygen level has improved after he started anticoagulation, he does not need oxygen supplement. -We'll continue Xarelto indefinitely. We discussed the pros and cons of right options of anticoagulation. If he has issues with bleeding on Xarelto, I would recommend him to switch to Coumadin, which is more reversible.  3. Hypercalcemia -likely secondary to his underlying malignancy -he received zometa on 02/22/16 -His calcium has normalized,  8.8 on 1/2,  -follow-up for his calcium level closely  4. Dysphagia, weight loss -I encouraged him to take nutrition  supplement, such as ensure boost, at least 3 bottles daily  -He has started palliative radiation, his dysphagia is stable  5. Chest pain -Secondary to his bone metastasis -He is on MS contin '15mg'$  q8h, and liquid morphine 10 mg every 4 hours as needed, tolerating well, pain is well controlled, he does not use liquid morphine much -Taking Flexeril 5-10 mg every 8 hours as needed  -Constipation and management strategies reviewed with patient again  6. Hypertension and his tachycardia -Likely secondary to his pain -We'll monitor  7. Goal of care discussion  -We previously had a lengthy discussion about his short-term and long-term goal of care.  -We discussed the incurable nature of his cancer, and the overall poor prognosis, especially if he does not have good response to chemotherapy or progress on chemo -The patient completely agrees with the goal of care is palliative, he is very realistic, had his power of attorney and living well down  -Patient states he values his quality of life more than his quality of life -I recommend DNR/DNI, he is in full agreement.  8. Social issues -He plans to apply for disability, I'll refer him to social worker to help him   Plan -Continue radiation, plan to complete on 1/26 -I'll see him back on January 25 or 26, and plan to restart chemotherapy FOLFOX the week after  All questions were answered. The patient knows to call the clinic with any problems, questions or concerns.  I spent 20 minutes counseling the patient face to face. The total time spent in the appointment was 25 minutes and more than 50% was on counseling.     Truitt Merle, MD 03/14/2016

## 2016-03-14 NOTE — Telephone Encounter (Signed)
Spoke with Education officer, museum and per Education officer, museum she has already spoken with patient.

## 2016-03-14 NOTE — Patient Instructions (Signed)

## 2016-03-15 ENCOUNTER — Ambulatory Visit: Payer: BLUE CROSS/BLUE SHIELD

## 2016-03-15 ENCOUNTER — Ambulatory Visit
Admission: RE | Admit: 2016-03-15 | Discharge: 2016-03-15 | Disposition: A | Discharge status: Home / Self Care | Payer: BLUE CROSS/BLUE SHIELD | Source: Ambulatory Visit | Attending: Radiation Oncology | Admitting: Radiation Oncology

## 2016-03-15 ENCOUNTER — Encounter: Payer: Self-pay | Admitting: Hematology

## 2016-03-15 DIAGNOSIS — Z510 Encounter for antineoplastic radiation therapy: Secondary | ICD-10-CM | POA: Diagnosis not present

## 2016-03-16 ENCOUNTER — Ambulatory Visit
Admission: RE | Admit: 2016-03-16 | Discharge: 2016-03-16 | Disposition: A | Discharge status: Home / Self Care | Payer: BLUE CROSS/BLUE SHIELD | Source: Ambulatory Visit | Attending: Radiation Oncology | Admitting: Radiation Oncology

## 2016-03-16 ENCOUNTER — Encounter: Payer: Self-pay | Admitting: Radiation Oncology

## 2016-03-16 ENCOUNTER — Ambulatory Visit: Payer: BLUE CROSS/BLUE SHIELD

## 2016-03-16 VITALS — BP 135/96 | HR 115 | Temp 98.4°F | Resp 20 | Wt 209.4 lb

## 2016-03-16 DIAGNOSIS — Z510 Encounter for antineoplastic radiation therapy: Secondary | ICD-10-CM | POA: Diagnosis not present

## 2016-03-16 DIAGNOSIS — C7951 Secondary malignant neoplasm of bone: Secondary | ICD-10-CM

## 2016-03-16 NOTE — Telephone Encounter (Signed)
Per response from [facility] patient chemo will be restarted the week after 1/25. New appointments will be scheduled at patient's 1/25 office visit.

## 2016-03-16 NOTE — Progress Notes (Signed)
Weekly rad txs spine 5/15 completed,  No skin irritation, not using biafine as yet, no nausea,  Slight achines in back,  Takes decadron 2mg  bid, no thrush seen ,  Appetite good, drinking plenty water 2:51 PM BP (!) 135/96 (BP Location: Left Arm, Patient Position: Sitting, Cuff Size: Normal)   Pulse (!) 115   Temp 98.4 F (36.9 C) (Oral)   Resp 20   Wt 209 lb 6.4 oz (95 kg)   BMI 30.05 kg/m   Wt Readings from Last 3 Encounters:  03/16/16 209 lb 6.4 oz (95 kg)  03/14/16 212 lb 11.2 oz (96.5 kg)  03/08/16 215 lb 9.6 oz (97.8 kg)

## 2016-03-16 NOTE — Progress Notes (Signed)
Department of Radiation Oncology  Phone:  [phone] Fax:        [phone]  Weekly Treatment Note    Name: Walter Craig Date: 03/16/2016 MRN: [id number] DOB: [birth year]   Diagnosis:     ICD-9-CM ICD-10-CM   1. Bone metastasis ([facility]) 198.5 C79.51      Current dose: 12.5 Gy  Current fraction: 5   MEDICATIONS: Current Outpatient Prescriptions  Medication Sig Dispense Refill  . cyclobenzaprine (FLEXERIL) 5 MG tablet Take 1-2 tablets (5-10 mg total) by mouth 3 (three) times daily as needed for muscle spasms. 60 tablet 0  . dexamethasone (DECADRON) 4 MG tablet Take 0.5 tablets (2 mg total) by mouth 2 (two) times daily. 30 tablet 0  . diclofenac (VOLTAREN) 75 MG EC tablet Take 75 mg by mouth 2 (two) times daily as needed (pain/inflammation).   1  . emollient (BIAFINE) cream Apply 1 application topically 2 (two) times daily.    . fluticasone (FLONASE) 50 MCG/ACT nasal spray Place 1 spray into both nostrils daily as needed (congestion).    Marland Kitchen lidocaine-prilocaine (EMLA) cream Apply to affected area once (Patient taking differently: Apply 1 application topically once as needed (prior to port access). ) 30 g 3  . metoprolol succinate (TOPROL-XL) 25 MG 24 hr tablet Take 25 mg by mouth daily.   1  . morphine (MS CONTIN) 15 MG 12 hr tablet Take 1 tablet (15 mg total) by mouth every 8 (eight) hours. 90 tablet 0  . Morphine Sulfate (MORPHINE CONCENTRATE) 10 MG/0.5ML SOLN concentrated solution Take 0.25-0.5 mLs (5-10 mg total) by mouth every 4 (four) hours as needed for severe pain. 30 mL 0  . Rivaroxaban 15 & 20 MG TBPK Take as directed on package: Start with one 15mg  tablet by mouth twice a day with food. On Day 22, switch to one 20mg  tablet once a day with food. 51 each 0  . zolpidem (AMBIEN) 10 MG tablet Take 10 mg by mouth at bedtime.   0  . ALPRAZolam (XANAX) 0.5 MG tablet Take 0.5 mg by mouth at bedtime as needed for sleep.    . hydrochlorothiazide (HYDRODIURIL) 25 MG tablet Take 25  mg by mouth daily.  1  . ondansetron (ZOFRAN) 8 MG tablet Take 1 tablet (8 mg total) by mouth 2 (two) times daily as needed for refractory nausea / vomiting. Start on day 3 after chemotherapy. (Patient not taking: Reported on 03/16/2016) 30 tablet 1  . pravastatin (PRAVACHOL) 80 MG tablet Take 80 mg by mouth daily.    . prochlorperazine (COMPAZINE) 10 MG tablet Take 1 tablet (10 mg total) by mouth every 6 (six) hours as needed (Nausea or vomiting). (Patient not taking: Reported on 03/16/2016) 30 tablet 1   No current facility-administered medications for this encounter.      ALLERGIES: Patient has no known allergies.   LABORATORY DATA:  Lab Results  Component Value Date   WBC 8.1 03/14/2016   HGB 13.1 03/14/2016   HCT 37.0 (L) 03/14/2016   MCV 87.1 03/14/2016   PLT 326 03/14/2016   Lab Results  Component Value Date   NA 136 03/14/2016   K 3.5 03/14/2016   CL 97 (L) 03/06/2016   CO2 31 (H) 03/14/2016   Lab Results  Component Value Date   ALT 30 03/14/2016   AST 24 03/14/2016   ALKPHOS 144 03/14/2016   BILITOT 0.76 03/14/2016     NARRATIVE: Marney Setting Bodin was seen today for weekly treatment management. The  chart was checked and the patient's films were reviewed.  Weekly rad txs spine 5/15 completed,  No skin irritation, not using biafine as yet, no nausea,  Slight achines in back,  Takes decadron 2mg  bid, no thrush seen ,  Appetite good, drinking plenty water 6:27 PM BP (!) 135/96 (BP Location: Left Arm, Patient Position: Sitting, Cuff Size: Normal)   Pulse (!) 115   Temp 98.4 F (36.9 C) (Oral)   Resp 20   Wt 209 lb 6.4 oz (95 kg)   BMI 30.05 kg/m   Wt Readings from Last 3 Encounters:  03/16/16 209 lb 6.4 oz (95 kg)  03/14/16 212 lb 11.2 oz (96.5 kg)  03/08/16 215 lb 9.6 oz (97.8 kg)    PHYSICAL EXAMINATION: weight is 209 lb 6.4 oz (95 kg). His oral temperature is 98.4 F (36.9 C). His blood pressure is 135/96 (abnormal) and his pulse is 115 (abnormal). His  respiration is 20.        ASSESSMENT: The patient is doing satisfactorily with treatment.  The patient looks quite good today and has had an excellent week. Hopefully his swallowing will improve going into next week.  PLAN: We will continue with the patient's radiation treatment as planned.

## 2016-03-19 ENCOUNTER — Ambulatory Visit: Payer: BLUE CROSS/BLUE SHIELD

## 2016-03-19 ENCOUNTER — Ambulatory Visit
Admission: RE | Admit: 2016-03-19 | Discharge: 2016-03-19 | Disposition: A | Discharge status: Home / Self Care | Payer: BLUE CROSS/BLUE SHIELD | Source: Ambulatory Visit | Attending: Radiation Oncology | Admitting: Radiation Oncology

## 2016-03-19 ENCOUNTER — Ambulatory Visit: Payer: BLUE CROSS/BLUE SHIELD | Admitting: Nutrition

## 2016-03-19 DIAGNOSIS — Z510 Encounter for antineoplastic radiation therapy: Secondary | ICD-10-CM | POA: Diagnosis not present

## 2016-03-19 NOTE — Progress Notes (Signed)
  Radiation Oncology         [phone]) [phone] ________________________________  Name: Walter Craig MRN: [id number]  Date: 03/08/2016  DOB: 02-09-1956  SIMULATION AND TREATMENT PLANNING NOTE  DIAGNOSIS:     ICD-9-CM ICD-10-CM   1. Bone metastasis ([facility]) 198.5 C79.51      Site:  Spine/ esophagus  NARRATIVE:  The patient was brought to the [facility].  Identity was confirmed.  All relevant records and images related to the planned course of therapy were reviewed.   Written consent to proceed with treatment was confirmed which was freely given after reviewing the details related to the planned course of therapy had been reviewed with the patient.  Then, the patient was set-up in a stable reproducible  supine position for radiation therapy.  CT images were obtained.  Surface markings were placed.     The CT images were loaded into the planning software.  Then the target and avoidance structures were contoured.  Treatment planning then occurred.  The radiation prescription was entered and confirmed.  A total of 3 complex treatment devices were fabricated which relate to the designed radiation treatment fields. Each of these customized fields/ complex treatment devices will be used on a daily basis during the radiation course. I have requested : 3D Simulation  I have requested a DVH of the following structures: target volume, spinal cord, lungs.   The patient will undergo daily image guidance to ensure accurate localization of the target, and adequate minimize dose to the normal surrounding structures in close proximity to the target.   PLAN:  The patient will receive 37.5 Gy in 15 fractions.   Special treatment procedure The patient will also receive concurrent chemotherapy during the treatment. The patient may therefore experience increased toxicity or side effects and the patient will be monitored for such problems. This may require extra lab work as necessary. This therefore  constitutes a special treatment procedure.   ________________________________   Jodelle Gross, MD, PhD

## 2016-03-19 NOTE — Progress Notes (Signed)
Nutrition follow-up completed with patient after radiation therapy for esophageal cancer. Patient's final radiation therapy is January 26. Weight was documented as 209.4 pounds on January 12 which reflects 8% weight loss over 3 weeks. Patient reports constipation for greater than one week. He had a small hard bowel movement this morning but still feels very constipated. He has difficulty swallowing smooth textures of foods but is tolerating liquids without difficulty. Dietary recall reveals patient drinks one milkshake or smoothie and one oral nutrition supplement a day.  Nutrition diagnosis: Unintended weight loss continues.  Intervention: Educated patient that oral intake is inadequate resulting in weight loss. Recommended patient increase Ensure Plus or boost plus to a minimum of 4 bottles a day. Encouraged patient to continue milkshakes or smoothies. Provided samples of juice based supplements. Recommended patient continue smooth foods as tolerated. Questions were answered.  Teach back method used.  Samples provided.  Monitoring, evaluation, goals:  Patient will tolerate increased calories and protein to minimize weight loss.  Next visit: Monday, January 22 after radiation therapy.  **Disclaimer: This note was dictated with voice recognition software. Similar sounding words can inadvertently be transcribed and this note may contain transcription errors which may not have been corrected upon publication of note.**

## 2016-03-20 ENCOUNTER — Ambulatory Visit: Payer: BLUE CROSS/BLUE SHIELD

## 2016-03-20 ENCOUNTER — Ambulatory Visit
Admission: RE | Admit: 2016-03-20 | Discharge: 2016-03-20 | Disposition: A | Discharge status: Home / Self Care | Payer: BLUE CROSS/BLUE SHIELD | Source: Ambulatory Visit | Attending: Radiation Oncology | Admitting: Radiation Oncology

## 2016-03-20 DIAGNOSIS — Z510 Encounter for antineoplastic radiation therapy: Secondary | ICD-10-CM | POA: Diagnosis not present

## 2016-03-21 ENCOUNTER — Ambulatory Visit
Admission: RE | Admit: 2016-03-21 | Discharge: 2016-03-21 | Disposition: A | Discharge status: Home / Self Care | Payer: BLUE CROSS/BLUE SHIELD | Source: Ambulatory Visit | Attending: Radiation Oncology | Admitting: Radiation Oncology

## 2016-03-21 ENCOUNTER — Encounter (HOSPITAL_COMMUNITY): Payer: Self-pay

## 2016-03-21 ENCOUNTER — Ambulatory Visit: Payer: BLUE CROSS/BLUE SHIELD

## 2016-03-21 DIAGNOSIS — Z510 Encounter for antineoplastic radiation therapy: Secondary | ICD-10-CM | POA: Diagnosis not present

## 2016-03-22 ENCOUNTER — Ambulatory Visit
Admission: RE | Admit: 2016-03-22 | Discharge: 2016-03-22 | Disposition: A | Discharge status: Home / Self Care | Payer: BLUE CROSS/BLUE SHIELD | Source: Ambulatory Visit | Attending: Radiation Oncology | Admitting: Radiation Oncology

## 2016-03-22 ENCOUNTER — Ambulatory Visit: Payer: BLUE CROSS/BLUE SHIELD

## 2016-03-22 DIAGNOSIS — Z510 Encounter for antineoplastic radiation therapy: Secondary | ICD-10-CM | POA: Diagnosis not present

## 2016-03-23 ENCOUNTER — Ambulatory Visit: Payer: BLUE CROSS/BLUE SHIELD

## 2016-03-23 ENCOUNTER — Ambulatory Visit
Admission: RE | Admit: 2016-03-23 | Discharge: 2016-03-23 | Disposition: A | Discharge status: Home / Self Care | Payer: BLUE CROSS/BLUE SHIELD | Source: Ambulatory Visit | Attending: Radiation Oncology | Admitting: Radiation Oncology

## 2016-03-23 ENCOUNTER — Encounter: Payer: Self-pay | Admitting: Radiation Oncology

## 2016-03-23 VITALS — BP 107/90 | HR 114 | Temp 97.4°F | Resp 20 | Ht 70.0 in | Wt 204.4 lb

## 2016-03-23 DIAGNOSIS — C7951 Secondary malignant neoplasm of bone: Secondary | ICD-10-CM

## 2016-03-23 DIAGNOSIS — Z510 Encounter for antineoplastic radiation therapy: Secondary | ICD-10-CM | POA: Diagnosis not present

## 2016-03-23 MED ORDER — SUCRALFATE 1 G PO TABS: 1.0000 g | ORAL_TABLET | Freq: Four times a day (QID) | ORAL | 2 refills | Status: DC

## 2016-03-23 MED FILL — SUCRALFATE 1 GM TAB: 1 | 30 days supply | Qty: 120 | Fill #0

## 2016-03-23 NOTE — Progress Notes (Addendum)
Weekly rWeekly rad txs spine 10/15 completed,  No skin irritation,  using biafine, no nausea,  Slight achines in back,  left hip pain 6/10Takes decadron 2mg  bid, no thrush seen ,  Appetite good, drinking plenty water.  Having problems swallowing food over the past week. Wt Readings from Last 3 Encounters:  03/23/16 204 lb 6.4 oz (92.7 kg)  03/16/16 209 lb 6.4 oz (95 kg)  03/14/16 212 lb 11.2 oz (96.5 kg)  Weight 5 pounds loss   Eating soup, cereal and drinking water.  Has boost and Ensure. BP 127/90   Pulse (!) 106   Temp 97.4 F (36.3 C) (Oral)   Resp 20   Ht 5\' 10"  (1.778 m)   Wt 204 lb 6.4 oz (92.7 kg)   SpO2 99%   BMI 29.33 kg/m Right arm sitting BP 107/90   Pulse (!) 114   Temp 97.4 F (36.3 C) (Oral)   Resp 20   Ht 5\' 10"  (1.778 m)   Wt 204 lb 6.4 oz (92.7 kg)   SpO2 99%   BMI 29.33 kg/m Right arm standing

## 2016-03-23 NOTE — Progress Notes (Signed)
Department of Radiation Oncology  Phone:  [phone] Fax:        [phone]  Weekly Treatment Note    Name: Walter Craig Date: 03/23/2016 MRN: [id number] DOB: 1956/02/27   Diagnosis:     ICD-9-CM ICD-10-CM   1. Bone metastasis ([facility]) 198.5 C79.51      Current dose: 25 Gy  Current fraction: 10   MEDICATIONS: Current Outpatient Prescriptions  Medication Sig Dispense Refill  . cyclobenzaprine (FLEXERIL) 5 MG tablet Take 1-2 tablets (5-10 mg total) by mouth 3 (three) times daily as needed for muscle spasms. 60 tablet 0  . dexamethasone (DECADRON) 4 MG tablet Take 0.5 tablets (2 mg total) by mouth 2 (two) times daily. 30 tablet 0  . emollient (BIAFINE) cream Apply 1 application topically 2 (two) times daily.    Marland Kitchen lidocaine-prilocaine (EMLA) cream Apply to affected area once (Patient taking differently: Apply 1 application topically once as needed (prior to port access). ) 30 g 3  . metoprolol succinate (TOPROL-XL) 25 MG 24 hr tablet Take 25 mg by mouth daily.   1  . morphine (MS CONTIN) 15 MG 12 hr tablet Take 1 tablet (15 mg total) by mouth every 8 (eight) hours. 90 tablet 0  . Rivaroxaban 15 & 20 MG TBPK Take as directed on package: Start with one 15mg  tablet by mouth twice a day with food. On Day 22, switch to one 20mg  tablet once a day with food. 51 each 0  . zolpidem (AMBIEN) 10 MG tablet Take 10 mg by mouth at bedtime.   0  . ALPRAZolam (XANAX) 0.5 MG tablet Take 0.5 mg by mouth at bedtime as needed for sleep.    Marland Kitchen diclofenac (VOLTAREN) 75 MG EC tablet Take 75 mg by mouth 2 (two) times daily as needed (pain/inflammation).   1  . fluticasone (FLONASE) 50 MCG/ACT nasal spray Place 1 spray into both nostrils daily as needed (congestion).    . hydrochlorothiazide (HYDRODIURIL) 25 MG tablet Take 25 mg by mouth daily.  1  . Morphine Sulfate (MORPHINE CONCENTRATE) 10 MG/0.5ML SOLN concentrated solution Take 0.25-0.5 mLs (5-10 mg total) by mouth every 4 (four) hours as needed for  severe pain. (Patient not taking: Reported on 03/23/2016) 30 mL 0  . ondansetron (ZOFRAN) 8 MG tablet Take 1 tablet (8 mg total) by mouth 2 (two) times daily as needed for refractory nausea / vomiting. Start on day 3 after chemotherapy. (Patient not taking: Reported on 03/16/2016) 30 tablet 1  . pravastatin (PRAVACHOL) 80 MG tablet Take 80 mg by mouth daily.    . prochlorperazine (COMPAZINE) 10 MG tablet Take 1 tablet (10 mg total) by mouth every 6 (six) hours as needed (Nausea or vomiting). (Patient not taking: Reported on 03/16/2016) 30 tablet 1   No current facility-administered medications for this encounter.      ALLERGIES: Patient has no known allergies.   LABORATORY DATA:  Lab Results  Component Value Date   WBC 8.1 03/14/2016   HGB 13.1 03/14/2016   HCT 37.0 (L) 03/14/2016   MCV 87.1 03/14/2016   PLT 326 03/14/2016   Lab Results  Component Value Date   NA 136 03/14/2016   K 3.5 03/14/2016   CL 97 (L) 03/06/2016   CO2 31 (H) 03/14/2016   Lab Results  Component Value Date   ALT 30 03/14/2016   AST 24 03/14/2016   ALKPHOS 144 03/14/2016   BILITOT 0.76 03/14/2016     NARRATIVE: Walter Craig was seen  today for weekly treatment management. The chart was checked and the patient's films were reviewed.  Weekly rWeekly rad txs spine 10/15 completed,  No skin irritation,  using biafine, no nausea,  Slight achines in back,  left hip pain 6/10Takes decadron 2mg  bid, no thrush seen ,  Appetite good, drinking plenty water.  Having problems swallowing food over the past week. Wt Readings from Last 3 Encounters:  03/23/16 204 lb 6.4 oz (92.7 kg)  03/16/16 209 lb 6.4 oz (95 kg)  03/14/16 212 lb 11.2 oz (96.5 kg)  Weight 5 pounds loss   Eating soup, cereal and drinking water.  Has boost and Ensure. BP 107/90   Pulse (!) 114   Temp 97.4 F (36.3 C) (Oral)   Resp 20   Ht 5\' 10"  (1.778 m)   Wt 204 lb 6.4 oz (92.7 kg)   SpO2 99%   BMI 29.33 kg/m Right arm sitting BP 107/90    Pulse (!) 114   Temp 97.4 F (36.3 C) (Oral)   Resp 20   Ht 5\' 10"  (1.778 m)   Wt 204 lb 6.4 oz (92.7 kg)   SpO2 99%   BMI 29.33 kg/m Right arm standing  The patient does complain of some weakness in the right upper extremity in terms of raising his arm above his shoulder.  PHYSICAL EXAMINATION: height is 5\' 10"  (1.778 m) and weight is 204 lb 6.4 oz (92.7 kg). His oral temperature is 97.4 F (36.3 C). His blood pressure is 107/90 and his pulse is 114 (abnormal). His respiration is 20 and oxygen saturation is 99%.      4/5 strength in the right upper extremity. Equal grip strength bilaterally. The patient had limited range of motion which he stated was not due to pain but rather just "weakness"  ASSESSMENT: The patient is doing satisfactorily with treatment.  He is doing fairly well. No significant esophagitis at this point but I will give him a prescription for this anticipated side effect over the next week. The patient does complain of some pain in the left hip region which is waxed and waned to some degree. It has not been steadily getting worse recently. He will let us know if the weakness in the right upper extremity increases. I do not see any relevant bony disease within the cervical spine on his recent bone scan.  PLAN: We will continue with the patient's radiation treatment as planned.

## 2016-03-25 ENCOUNTER — Encounter (HOSPITAL_COMMUNITY): Payer: Self-pay | Admitting: Emergency Medicine

## 2016-03-25 ENCOUNTER — Emergency Department (HOSPITAL_COMMUNITY)
Admission: EM | Admit: 2016-03-25 | Discharge: 2016-03-25 | Disposition: A | Discharge status: Home / Self Care | Payer: BLUE CROSS/BLUE SHIELD | Attending: Emergency Medicine | Admitting: Emergency Medicine

## 2016-03-25 DIAGNOSIS — I10 Essential (primary) hypertension: Secondary | ICD-10-CM | POA: Insufficient documentation

## 2016-03-25 DIAGNOSIS — Z7902 Long term (current) use of antithrombotics/antiplatelets: Secondary | ICD-10-CM | POA: Insufficient documentation

## 2016-03-25 DIAGNOSIS — Z7901 Long term (current) use of anticoagulants: Secondary | ICD-10-CM | POA: Insufficient documentation

## 2016-03-25 DIAGNOSIS — Z87891 Personal history of nicotine dependence: Secondary | ICD-10-CM | POA: Diagnosis not present

## 2016-03-25 DIAGNOSIS — Z79899 Other long term (current) drug therapy: Secondary | ICD-10-CM | POA: Diagnosis not present

## 2016-03-25 DIAGNOSIS — Z8501 Personal history of malignant neoplasm of esophagus: Secondary | ICD-10-CM | POA: Diagnosis not present

## 2016-03-25 DIAGNOSIS — K5900 Constipation, unspecified: Secondary | ICD-10-CM | POA: Insufficient documentation

## 2016-03-25 LAB — BASIC METABOLIC PANEL
ANION GAP: 9 (ref 5–15)
BUN: 33 mg/dL — ABNORMAL HIGH (ref 6–20)
CALCIUM: 10.6 mg/dL — AB (ref 8.9–10.3)
CO2: 24 mmol/L (ref 22–32)
Chloride: 102 mmol/L (ref 101–111)
Creatinine, Ser: 0.63 mg/dL (ref 0.61–1.24)
Glucose, Bld: 116 mg/dL — ABNORMAL HIGH (ref 65–99)
POTASSIUM: 4 mmol/L (ref 3.5–5.1)
Sodium: 135 mmol/L (ref 135–145)

## 2016-03-25 LAB — URINALYSIS, ROUTINE W REFLEX MICROSCOPIC
Bilirubin Urine: NEGATIVE
Glucose, UA: 50 mg/dL — AB
Hgb urine dipstick: NEGATIVE
KETONES UR: NEGATIVE mg/dL
LEUKOCYTES UA: NEGATIVE
Nitrite: NEGATIVE
PROTEIN: NEGATIVE mg/dL
Specific Gravity, Urine: 1.018 (ref 1.005–1.030)
pH: 5 (ref 5.0–8.0)

## 2016-03-25 LAB — CBC WITH DIFFERENTIAL/PLATELET
BASOS ABS: 0 10*3/uL (ref 0.0–0.1)
Basophils Relative: 0 %
EOS ABS: 0 10*3/uL (ref 0.0–0.7)
EOS PCT: 0 %
HCT: 39.2 % (ref 39.0–52.0)
Hemoglobin: 13.2 g/dL (ref 13.0–17.0)
LYMPHS PCT: 6 %
Lymphs Abs: 0.8 10*3/uL (ref 0.7–4.0)
MCH: 30.3 pg (ref 26.0–34.0)
MCHC: 33.7 g/dL (ref 30.0–36.0)
MCV: 90.1 fL (ref 78.0–100.0)
Monocytes Absolute: 0.9 10*3/uL (ref 0.1–1.0)
Monocytes Relative: 7 %
Neutro Abs: 12 10*3/uL — ABNORMAL HIGH (ref 1.7–7.7)
Neutrophils Relative %: 87 %
PLATELETS: 197 10*3/uL (ref 150–400)
RBC: 4.35 MIL/uL (ref 4.22–5.81)
RDW: 14 % (ref 11.5–15.5)
WBC: 13.7 10*3/uL — AB (ref 4.0–10.5)

## 2016-03-25 MED ORDER — FLEET ENEMA 7-19 GM/118ML RE ENEM
1.0000 | ENEMA | Freq: Once | RECTAL | Status: AC
  Administered 2016-03-25: 1 via RECTAL
  Filled 2016-03-25: qty 1

## 2016-03-25 MED ORDER — MILK AND MOLASSES ENEMA
1.0000 | Freq: Once | RECTAL | Status: AC
  Administered 2016-03-25: 250 mL via RECTAL
  Filled 2016-03-25: qty 250

## 2016-03-25 NOTE — Discharge Instructions (Signed)
Please read the attached information on constipation.   You were given two enemas today (FLEET and Milk of molasses) which were successful in producing a bowel movement.    The most important prescription for constipation is dietary and exercise regimen including fluids (2L per day of water), fiber (Metamucil 1tsp three times daily) and exercise.  Additionally, please take Dulcolax 10 mg per rectum three times a day, Magnesium citrate 240 mL once daily and Miralax 17 grams once daily for the next 2-3 days to continue inducing bowel movements. All of these medications are available over the counter.  Please follow up with your primary care provider or oncologist for further discussion of your symptoms and long term treatment of constipation.

## 2016-03-25 NOTE — ED Triage Notes (Signed)
Pt from home with complaints of constipation. 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 Pt states he last had chemo 3 weeks ago and has been undergoing radiation as well. Pt states LBM was 6 days ago. Pt states has complaints of rectal pain and back pain that he rates at 8/10. Pt has been trying stool softeners and milk of magnesium at home with no relief.

## 2016-03-25 NOTE — ED Provider Notes (Signed)
[facility] DEPT Provider Note   CSN: [id number] Arrival date & time: 03/25/16  1509     History   Chief Complaint Chief Complaint  Patient presents with  . Constipation    HPI Walter Craig is a 61 y.o. 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 Patient is able to pass gas. Patient reports mild lower abdominal pain. Patient states he has tried Dulcolax and milk of magnesia daily for the last 6 days without benefit. Patient states that he had similar episode of constipation last month where he did not have a bowel movement in 6 days. Patient stated that he used his finger to remove some fecal matter which induced a bowel movement. Patient is on chronic narcotic pain medication for cancer pain including MS Contin. Patient states he has been eating well. Patient denies fevers, chest pain, shortness of breath, nausea, vomiting, rectal pain, problems urinating.  Of note patient is in palliative care for cancer. Patient is status post 1 round of FOLFOX chemotherapy finished on 02/29/16 and currently doing palliative radiation which will end on 03/30/16. Plan is to resume FOLFOX can chemotherapy after palliative radiation.  HPI  Past Medical History:  Diagnosis Date  . Anxiety   . Bone cancer ([place])    t=11 lytic lesion  . Cancer ([place]) 02/2016   low esophagus cancer, mets   . Depression   . Depression with anxiety   . Esophageal cancer ([place]) 02/24/2016   invasive adenocarcinoma  . HLD (hyperlipidemia)   . Hypertension   . Pulmonary embolism ([place])   . Sleep apnea     Patient Active Problem List   Diagnosis Date Noted  . Goals of care, counseling/discussion 03/07/2016  . Bone metastasis ([place]) 03/07/2016  . PE (pulmonary thromboembolism) ([place]) 03/02/2016  .  Pulmonary embolism ([place]) 03/02/2016  . Hypokalemia 03/02/2016  . Essential hypertension   . HLD (hyperlipidemia)   . Anxiety   . Hypercalcemia 02/28/2016  . Cancer related pain 02/28/2016  . Esophageal cancer, stage IV ([place]) 02/22/2016    Past Surgical History:  Procedure Laterality Date  . CHOLECYSTECTOMY    . IR GENERIC HISTORICAL  02/24/2016   IR US GUIDE VASC ACCESS RIGHT 02/24/2016 Greggory Keen, MD [facility]  . IR GENERIC HISTORICAL  02/24/2016   IR FLUORO GUIDE PORT INSERTION RIGHT 02/24/2016 Greggory Keen, MD [facility]  . left knee meniscus repare          Home Medications    Prior to Admission medications   Medication Sig Start Date End Date Taking? Authorizing Provider  ALPRAZolam Duanne Moron) 0.5 MG tablet Take 0.5 mg by mouth at bedtime as needed for sleep.    Historical Provider, MD  cyclobenzaprine (FLEXERIL) 5 MG tablet Take 1-2 tablets (5-10 mg total) by mouth 3 (three) times daily as needed for muscle spasms. 03/07/16   Truitt Merle, MD  dexamethasone (DECADRON) 4 MG tablet Take 0.5 tablets (2 mg total) by mouth 2 (two) times daily. 03/08/16   Hayden Pedro, PA-C  diclofenac (VOLTAREN) 75 MG EC tablet Take 75 mg by mouth 2 (two) times daily as needed (pain/inflammation).  02/01/16   Historical Provider, MD  emollient (BIAFINE) cream Apply 1 application topically 2 (two) times daily. 03/12/16   Hayden Pedro, PA-C  fluticasone [facility]) 50 MCG/ACT nasal spray  Place 1 spray into both nostrils daily as needed (congestion).    Historical Provider, MD  hydrochlorothiazide (HYDRODIURIL) 25 MG tablet Take 25 mg by mouth daily. 01/18/16   Historical Provider, MD  lidocaine-prilocaine (EMLA) cream Apply to affected area once Patient taking differently: Apply 1 application topically once as needed (prior to port access).  02/28/16   Truitt Merle, MD  metoprolol succinate (TOPROL-XL) 25 MG 24 hr tablet Take 25 mg by mouth daily.  01/23/16   Historical Provider, MD    morphine (MS CONTIN) 15 MG 12 hr tablet Take 1 tablet (15 mg total) by mouth every 8 (eight) hours. 02/28/16   Truitt Merle, MD  Morphine Sulfate (MORPHINE CONCENTRATE) 10 MG/0.5ML SOLN concentrated solution Take 0.25-0.5 mLs (5-10 mg total) by mouth every 4 (four) hours as needed for severe pain. Patient not taking: Reported on 03/23/2016 03/01/16   Maryanna Shape, NP  ondansetron (ZOFRAN) 8 MG tablet Take 1 tablet (8 mg total) by mouth 2 (two) times daily as needed for refractory nausea / vomiting. Start on day 3 after chemotherapy. Patient not taking: Reported on 03/16/2016 02/28/16   Truitt Merle, MD  pravastatin (PRAVACHOL) 80 MG tablet Take 80 mg by mouth daily.    Historical Provider, MD  prochlorperazine (COMPAZINE) 10 MG tablet Take 1 tablet (10 mg total) by mouth every 6 (six) hours as needed (Nausea or vomiting). Patient not taking: Reported on 03/16/2016 02/28/16   Truitt Merle, MD  Rivaroxaban 15 & 20 MG TBPK Take as directed on package: Start with one 15mg  tablet by mouth twice a day with food. On Day 22, switch to one 20mg  tablet once a day with food. 03/06/16   Doreatha Lew, MD  sucralfate (CARAFATE) 1 g tablet Take 1 tablet (1 g total) by mouth 4 (four) times daily. 03/23/16   Kyung Rudd, MD  zolpidem (AMBIEN) 10 MG tablet Take 10 mg by mouth at bedtime.  01/20/16   Historical Provider, MD    Family History Family History  Problem Relation Age of Onset  . Cancer Father     colon cancer   . Cancer Sister     colon cancer   . Cancer Brother 65    gallbladder cancer     Social History Social History  Substance Use Topics  . Smoking status: Former Smoker    Packs/day: 1.50    Years: 30.00    Quit date: 03/05/2004  . Smokeless tobacco: Never Used  . Alcohol use No     Comment: rarely      Allergies   Patient has no known allergies.   Review of Systems Review of Systems  Constitutional: Negative for chills and fever.  HENT: Negative for congestion and sore throat.    Eyes: Negative for visual disturbance.  Respiratory: Negative for cough and shortness of breath.   Cardiovascular: Negative for chest pain and palpitations.  Gastrointestinal: Positive for abdominal pain and constipation. Negative for anal bleeding, blood in stool, diarrhea, nausea, rectal pain and vomiting.  Genitourinary: Negative for difficulty urinating, flank pain and hematuria.  Musculoskeletal: Negative for joint swelling and myalgias.  Skin: Negative for rash.  Neurological: Negative for dizziness, syncope, weakness, light-headedness and headaches.  Hematological: Negative.   Psychiatric/Behavioral: Negative.      Physical Exam Updated Vital Signs BP 126/91 (BP Location: Right Arm)   Pulse 87   Temp 98.2 F (36.8 C) (Oral)   Resp 18   Ht 5\' 10"  (1.778 m)   Wt  92.5 kg   SpO2 99%   BMI 29.27 kg/m   Physical Exam  Constitutional: He is oriented to person, place, and time. He appears well-developed and well-nourished. No distress.  HENT:  Head: Normocephalic and atraumatic.  Nose: Nose normal.  Mouth/Throat: Oropharynx is clear and moist. No oropharyngeal exudate.  Moist mucous membranes.  No nasal mucosa edema. Oropharynx and tonsils pink without erythema, edema, vesicles or exudates. Uvula midline. No trismus.   Eyes: Conjunctivae and EOM are normal. Pupils are equal, round, and reactive to light.  Neck: Normal range of motion. Neck supple. No JVD present. No tracheal deviation present.  Cardiovascular: Normal rate, regular rhythm, normal heart sounds and intact distal pulses.   No murmur heard. Pulmonary/Chest: Effort normal and breath sounds normal. No respiratory distress. He has no wheezes. He has no rales.  Abdominal:  Abdomen is soft and non distended. +bowel sounds throughout.  Mild lower abdominal tenderness to deep palpation.  No CVAT bilaterally.  No suprapubic tenderness. Negative Murphy's. Negative McBurney's. Negative Psoas sign.  Musculoskeletal:  Normal range of motion. He exhibits no deformity.  Lymphadenopathy:    He has no cervical adenopathy.  Neurological: He is alert and oriented to person, place, and time.  Skin: Skin is warm and dry. Capillary refill takes less than 2 seconds.  Psychiatric: He has a normal mood and affect. His behavior is normal. Judgment and thought content normal.  Nursing note and vitals reviewed.    ED Treatments / Results  Labs (all labs ordered are listed, but only abnormal results are displayed) Labs Reviewed  CBC WITH DIFFERENTIAL/PLATELET - Abnormal; Notable for the following:       Result Value   WBC 13.7 (*)    Neutro Abs 12.0 (*)    All other components within normal limits  BASIC METABOLIC PANEL - Abnormal; Notable for the following:    Glucose, Bld 116 (*)    BUN 33 (*)    Calcium 10.6 (*)    All other components within normal limits  URINALYSIS, ROUTINE W REFLEX MICROSCOPIC - Abnormal; Notable for the following:    Glucose, UA 50 (*)    All other components within normal limits    EKG  EKG Interpretation None       Radiology No results found.  Procedures Procedures (including critical care time)  Medications Ordered in ED Medications  sodium phosphate (FLEET) 7-19 GM/118ML enema 1 enema (1 enema Rectal Given 03/25/16 1802)  milk and molasses enema (250 mLs Rectal Given 03/25/16 1945)     Initial Impression / Assessment and Plan / ED Course  I have reviewed the triage vital signs and the nursing notes.  Pertinent labs & imaging results that were available during my care of the patient were reviewed by me and considered in my medical decision making (see chart for details).  Clinical Course as of Mar 28 1014  Sun Mar 25, 2016  1730 Digital disimpaction with evacuation of copious, non bloody fecal matter.   [CG]  1745 Pt unable to have  BM after digital disimpaction, enema given.   [CG]  1824 Enema unsuccessful, pt requests repeat digital disimpaction.  [CG]  1900  Repeat digital disimpaction with evacuation of copious amounts of fecal mater, will give milk of molasses enema  [CG]  2006 Pt had successful BM after enema. Pt tolerating fluids.   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 Pt had similar episode of constipation last month at which time he was able to induce BM with dulcolax, milk of magnesia and self digital disimpaction.  Pt has tried the same this time without relief. VS within normal limits. Mild lower abdominal tenderness to deep palpation. DRE remarkable for one large non bleeding, non tender external hemorrhoid. Large amounts of soft fecal matter in rectal vault.  Pt declined x-rays today, understandably so given poor prognosis of esophageal cancer and palliative care status. Pt has f/u CT scan next week after palliative radiation is complete.    Pt was disimpacted x 2.  Pt received enema x 2 with successful BM in ED. Pt denied rectal pain or bloody stool.  Pt reports significant improvement in abdominal discomfort. Pt tolerate PO fluids and ambulated in ED without difficulty. Pt will be discharged today with bowel regimen and close pcp/oncology f/u for long term management of constipation.  ED return instructions given. Pt agreeable to discharge plan.   Final Clinical Impressions(s) / ED Diagnoses   Final diagnoses:  Constipation, unspecified constipation type    New Prescriptions Discharge Medication List as of 03/25/2016  8:05 PM       Kinnie Feil, PA-C 03/28/16 [place], PA-C 03/28/16 [place], MD 03/28/16 1018

## 2016-03-26 ENCOUNTER — Ambulatory Visit
Admission: RE | Admit: 2016-03-26 | Discharge: 2016-03-26 | Disposition: A | Discharge status: Home / Self Care | Payer: BLUE CROSS/BLUE SHIELD | Source: Ambulatory Visit | Attending: Radiation Oncology | Admitting: Radiation Oncology

## 2016-03-26 ENCOUNTER — Ambulatory Visit: Payer: BLUE CROSS/BLUE SHIELD

## 2016-03-26 ENCOUNTER — Ambulatory Visit: Payer: BLUE CROSS/BLUE SHIELD | Admitting: Nutrition

## 2016-03-26 DIAGNOSIS — Z510 Encounter for antineoplastic radiation therapy: Secondary | ICD-10-CM | POA: Diagnosis not present

## 2016-03-26 NOTE — Progress Notes (Signed)
Nutrition follow-up completed with patient after radiation therapy for esophageal cancer. Patient's final radiation therapy is January 26. Plan is for chemotherapy. Weight documented as 204 pounds January 21 decreased from 228 pounds December 19. Patient reports he has difficulty swallowing and is tolerating liquids and very soft foods in small amounts. Patient has been having constipation and has had to go to the ED for relief. Patient complains of bloating, fatigue and increased weakness.  Patient meets criteria for severe malnutrition in the context of acute illness secondary to 11% weight loss in approximately one month and less than 75% energy intake for greater than 7 days as well as depletion of both body fat and muscle mass.  Estimated nutrition needs: 2600-2800 calories, 120-135 grams protein, 2.6 L fluid.  Nutrition diagnosis: Unintended weight loss continues.  Intervention: Educated patient on the importance of bowel management.  Encouraged patient to consume 2-3 quarts water daily. Recommended patient increase Ensure Plus or equivalent to 6-8 bottles daily. Provided recipes and fact sheets. Also provided samples of oral nutrition supplements. Recommended 4 ounces prune juice on a daily basis. Relayed information to patient that M.D. wants him to take Senokot S twice a day along with magnesium citrate as needed. Questions were answered.  Teach back method used.  Monitoring, evaluation, goals: Patient will increase oral nutrition supplements and fluids to minimize further weight loss and prevent constipation.  Next visit: To be scheduled with chemotherapy.  **Disclaimer: This note was dictated with voice recognition software. Similar sounding words can inadvertently be transcribed and this note may contain transcription errors which may not have been corrected upon publication of note.**

## 2016-03-27 ENCOUNTER — Ambulatory Visit: Payer: BLUE CROSS/BLUE SHIELD

## 2016-03-27 ENCOUNTER — Ambulatory Visit
Admission: RE | Admit: 2016-03-27 | Discharge: 2016-03-27 | Disposition: A | Discharge status: Home / Self Care | Payer: BLUE CROSS/BLUE SHIELD | Source: Ambulatory Visit | Attending: Radiation Oncology | Admitting: Radiation Oncology

## 2016-03-27 DIAGNOSIS — Z510 Encounter for antineoplastic radiation therapy: Secondary | ICD-10-CM | POA: Diagnosis not present

## 2016-03-27 NOTE — Progress Notes (Signed)
[facility]  [phone]) [phone] [phone]) [phone]  Clinic follow Up Note   Patient Care Team: Hulan Fess, MD as PCP - General (Family Medicine) Wonda Horner, MD as Consulting Physician (Gastroenterology) Truitt Merle, MD as Consulting Physician (Hematology) 03/31/2016   Oncology History   Esophageal cancer, stage IV [facility])   Staging form: Esophagus - Adenocarcinoma, AJCC 8th Edition   - Clinical: Stage IVB (cTX, cNX, cM1) - Signed by Truitt Merle, MD on 03/15/2016      Esophageal cancer, stage IV ([place])   02/09/2016 Imaging    CT chest with contrast showed constellation of abnormal posterior mediastinum and lytic osseous metastatic disease most compatible with stage IV esophageal cancer, distal thoracic esophagus masslike soft tissue thickening and irregularity up to 2 cm. Multiple destructive rib metastasis with pathological fractures. Multiple thoracic and upper lumbar vertebral metastasis, without suspected epidural extension.      02/15/2016 Procedure     esophageal mucosal change consistent with long segment Barrett's esophagus, a large malignant mass was found in the distal esophagus, partially obstructing and 3/4 circumferential.      02/15/2016 Initial Biopsy    Distal esophageal mass biopsy showed adenocarcinoma, arising in the background of intestinal metaplasia associate with high-grade granular dysplasia. Positive for lymphovascular invasion.      02/15/2016 Initial Diagnosis    Esophageal cancer, stage IV ([place])      03/01/2016 -  Chemotherapy    FOLFOX every 2 weeks, held during radiation       03/02/2016 Imaging    CT and her chest showed acute pulmonary embolism in bilateral lobar through segmental right upper, right low and left upper pulmonary arteries with CT evidence of right heart strain, consistent with at least submassive PE      03/05/2016 Imaging    MR Thoracic spine with/without contrast 1. Diffuse osseous metastatic disease involving  the cervical spine, thoracic spine and upper lumber spine. 2. No cord lesions. 3. The most significant thoracic metastatic disease is located at T11 on the left side where there is extensive tumor in the posterior aspect of the 2 body, in the left pedicle can left lamina and transverse process. There is also tumor bulging slightly into the left neural foramen at T11-12. There is epidural tumor on left side and there is mild mass effect on the left side of the thecal sac.      03/09/2016 Miscellaneous    Insufficient tissue for Her-2 testing. PD-L1 was positive (03/21/16)      03/12/2016 -  Radiation Therapy    -Palliative radiation to establish her cancer and T10-11 bone metastasis  -15 fractions over 3 weeks -Finishes on 04/02/16       CHIEF COMPLAINTS:  Follow up metastatic esophageal cancer   HISTORY OF PRESENTING ILLNESS (02/21/2016):  Walter Craig 61 y.o. male is here because of His recently diagnosed metastatic esophageal cancer. He is accompanied by his sister-in-law to my clinic today. His daughter was on the phone during the clinic visit also.  He presented with bilateral chest and back pain in early 01-25-2023, after his brother's death. He was seen by his PCP. He had CXR which was negative and he took NSAIDs for the pain. He was referred to PT, bu this pain did not improve. A CT scan was subsequently obtained, which showed distal surgery. Thoracic esophagus masslike soft tissue thickening and irregularity up to his 2 cm, multiple mediastinal and osseous metastasis compatible with stage IV esophageal cancer. He was  referred to S retinologist Dr. Penelope Coop, underwent EGD on 02/15/2016 which showed esophageal mucosal change consistent with long segment Barrett's esophagus, a large malignant mass was found in the distal esophagus, partially obstructing and 3/4 circumferential. Biopsies showed invasive adenocarcinoma.  He denies any dysphagia, he recently developed mid chest pain when he eats lately,  better with soft and liquid diet. Pain 9/10, he takes hydrocordone 2 tab a day, not well controlled. He still works, he is a Chiropractor, desk job   He had not had BM for 8 days, probably secondary to his pain medication.  He has good appetite, but eats less due to dysphagea, he lost about 20 lbs in the past few months   He has three chidren, his daughter lives in [place], 2 sons living in [place]. His brother Inocente Salles recently died from clinical carcinoma, and [name] is currently receiving Keytruda for her cholangiocarcinoma.  CURRENT THERAPY:  1. first line chemo FOLFOX started on 02/29/2016, held afterwards due to radiation 2. Palliative radiation to esophageal cancer and T10-11 bone metastasis  INTERIM HISTORY: Mr. Reiber returns for follow up. He reported to the ED yesterday for constipation. He is not eating well, losing weight, losing muscle mass. When he does eat, he eats soft foods. He is uncomfortable from the constipation. He has tried Office manager and Milk of Magnesia occasionally. He doesn't do anything regularly, though. By the time he left the ED, he had a bowel movement, but hasn't taken any medication today. He started his blood thinner (20 mg) today. Denies any other complaints.   MEDICAL HISTORY:  Past Medical History:  Diagnosis Date  . Anxiety   . Bone cancer ([place])    t=11 lytic lesion  . Cancer ([place]) 02/2016   low esophagus cancer, mets   . Depression   . Depression with anxiety   . Esophageal cancer ([place]) 02/24/2016   invasive adenocarcinoma  . HLD (hyperlipidemia)   . Hypertension   . Pulmonary embolism ([place])   . Sleep apnea     SURGICAL HISTORY: Past Surgical History:  Procedure Laterality Date  . CHOLECYSTECTOMY    . IR GENERIC HISTORICAL  02/24/2016   IR US GUIDE VASC ACCESS RIGHT 02/24/2016 Greggory Keen, MD [facility]-INTERV RAD  . IR GENERIC HISTORICAL  02/24/2016   IR FLUORO GUIDE PORT INSERTION RIGHT 02/24/2016 Greggory Keen, MD [facility]-INTERV  RAD  . left knee meniscus repare       SOCIAL HISTORY: Social History   Social History  . Marital status: Married    Spouse name: N/A  . Number of children: N/A  . Years of education: N/A   Occupational History  . Not on file.   Social History Main Topics  . Smoking status: Former Smoker    Packs/day: 1.50    Years: 30.00    Quit date: 03/05/2004  . Smokeless tobacco: Never Used  . Alcohol use No     Comment: rarely   . Drug use: Yes    Types: Marijuana     Comment: he reports random use  . Sexual activity: Not on file   Other Topics Concern  . 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 Cancer Father     colon cancer   . Cancer Sister     colon cancer   .  Cancer Brother 41    gallbladder cancer     ALLERGIES:  has No Known Allergies.  MEDICATIONS:  Current Outpatient Prescriptions  Medication Sig Dispense Refill  . ALPRAZolam (XANAX) 0.5 MG tablet Take 0.5 mg by mouth at bedtime as needed for sleep.    . cyclobenzaprine (FLEXERIL) 5 MG tablet Take 1-2 tablets (5-10 mg total) by mouth 3 (three) times daily as needed for muscle spasms. 60 tablet 0  . dexamethasone (DECADRON) 4 MG tablet Take 0.5 tablets (2 mg total) by mouth 2 (two) times daily. 30 tablet 0  . diclofenac (VOLTAREN) 75 MG EC tablet Take 75 mg by mouth 2 (two) times daily as needed (pain/inflammation).   1  . emollient (BIAFINE) cream Apply 1 application topically 2 (two) times daily.    Marland Kitchen lidocaine-prilocaine (EMLA) cream Apply to affected area once (Patient taking differently: Apply 1 application topically once as needed (prior to port access). ) 30 g 3  . magnesium citrate SOLN Take 1 Bottle by mouth once.    . metoprolol succinate (TOPROL-XL) 25 MG 24 hr tablet Take 25 mg by mouth daily.   1  . morphine (MS CONTIN) 15 MG 12 hr tablet  Take 1 tablet (15 mg total) by mouth every 8 (eight) hours. 90 tablet 0  . Morphine Sulfate (MORPHINE CONCENTRATE) 10 MG/0.5ML SOLN concentrated solution Take 0.25-0.5 mLs (5-10 mg total) by mouth every 4 (four) hours as needed for severe pain. 30 mL 0  . ondansetron (ZOFRAN) 8 MG tablet Take 1 tablet (8 mg total) by mouth 2 (two) times daily as needed for refractory nausea / vomiting. Start on day 3 after chemotherapy. 30 tablet 1  . polyethylene glycol powder (MIRALAX) powder TAKE 6 CAPFULS OF MIRALAX IN A 32 OUNCE GATORADE AND DRINK THE WHOLE BEVERAGE FOLLOWED BY 3 CAPFULS TWICE A DAY FOR THE NEXT WEEK AND FOLLOW UP WITH YOUR PRIMARY CARE PHYSICIAN. 500 g 0  . sucralfate (CARAFATE) 1 g tablet Take 1 tablet (1 g total) by mouth 4 (four) times daily. 120 tablet 2  . zolpidem (AMBIEN) 10 MG tablet Take 10 mg by mouth at bedtime.   0  . Lactulose 20 GM/30ML SOLN Take 30 mLs (20 g total) by mouth every 8 (eight) hours as needed. 473 mL 2  . rivaroxaban (XARELTO) 20 MG TABS tablet Take 1 tablet (20 mg total) by mouth daily with supper. 30 tablet 2   No current facility-administered medications for this visit.     REVIEW OF SYSTEMS:   Constitutional: Denies fevers, chills or abnormal night sweats (+) weight loss, loss of appetite, weakness.  Eyes: Denies blurriness of vision, double vision or watery eyes Ears, nose, mouth, throat, and face: Denies mucositis or sore throat (+) dysphagia and feeling full quickly Respiratory: Denies cough, dyspnea or wheezes Cardiovascular: Denies palpitation, chest discomfort or lower extremity swelling Gastrointestinal:  Denies nausea, heartburn or change in bowel habits (+) severe constipation Skin: Denies abnormal skin rashes Musculoskeletal: (+) back pain Lymphatics: Denies new lymphadenopathy or easy bruising Neurological:Denies numbness, tingling or new weaknesses Behavioral/Psych: Mood is stable, no new changes  All other systems were reviewed with the  patient and are negative.  PHYSICAL EXAMINATION: ECOG PERFORMANCE STATUS: 2  Vitals:   03/29/16 1340  BP: 98/75  Pulse: (!) 118  Resp: 18  Temp: 98.1 F (36.7 C)   Filed Weights   03/29/16 1340  Weight: 200 lb 3.2 oz (90.8 kg)   GENERAL:alert, no distress and comfortable SKIN:  skin color, texture, turgor are normal, no rashes or significant lesions EYES: normal, conjunctiva are pink and non-injected, sclera clear OROPHARYNX:no exudate, no erythema and lips, buccal mucosa, and tongue normal  NECK: supple, thyroid normal size, non-tender, without nodularity LYMPH:  no palpable lymphadenopathy in the cervical, axillary or inguinal LUNGS: clear to auscultation and percussion with normal breathing effort HEART: regular rate & rhythm and no murmurs and no lower extremity edema ABDOMEN:abdomen soft, non-tender and normal bowel sounds Musculoskeletal:no cyanosis of digits and no clubbing  PSYCH: alert & oriented x 3 with fluent speech NEURO: no focal motor/sensory deficits  LABORATORY DATA:  I have reviewed the data as listed CBC Latest Ref Rng & Units 03/29/2016 03/25/2016 03/14/2016  WBC 4.0 - 10.3 10e3/uL 7.6 13.7(H) 8.1  Hemoglobin 13.0 - 17.1 g/dL 12.3(L) 13.2 13.1  Hematocrit 38.4 - 49.9 % 36.0(L) 39.2 37.0(L)  Platelets 140 - 400 10e3/uL 105(L) 197 326     CMP Latest Ref Rng & Units 03/29/2016 03/25/2016 03/14/2016  Glucose 70 - 140 mg/dl 92 116(H) 137  BUN 7.0 - 26.0 mg/dL 40.1(H) 33(H) 22.8  Creatinine 0.7 - 1.3 mg/dL 0.8 0.63 0.9  Sodium 136 - 145 mEq/L 133(L) 135 136  Potassium 3.5 - 5.1 mEq/L 4.8 4.0 3.5  Chloride 101 - 111 mmol/L - 102 -  CO2 22 - 29 mEq/L 28 24 31(H)  Calcium 8.4 - 10.4 mg/dL 11.7(H) 10.6(H) 10.9(H)  Total Protein 6.4 - 8.3 g/dL 6.0(L) - 6.9  Total Bilirubin 0.20 - 1.20 mg/dL 1.02 - 0.76  Alkaline Phos 40 - 150 U/L 162(H) - 144  AST 5 - 34 U/L 52(H) - 24  ALT 0 - 55 U/L 61(H) - 30   CEA (0-5ng/ml) 02/21/2016: 17.75   PATHOLOGY REPORT    Diagnosis 02/15/2016 Adenocarcinoma present involving at least laminal propria and arising in background of intestinal metaplasia associated high-grade granular dysplasia. Positive for lymphovascular invasion.  Diagnosis 02/24/2016  Addendum: Due to the chronic active gastritis present, a Warthin-Starry stain is performed to assess for Helicobacter pylori. The stain is negative for bacterial organisms morphologically consistent with Helicobacter pylori (incidental bacterial forms are present within the associated inflammatory debris, likely representing incidental pick-up and not true pathologic infection).  RADIOGRAPHIC STUDIES: I have personally reviewed the radiological images as listed and agreed with the findings in the report. Ct Angio Chest Pe W And/or Wo Contrast  Addendum Date: 03/02/2016   ADDENDUM REPORT: 03/02/2016 20:59 ADDENDUM: There soft tissue densities outlined by contrast in the distal SVC and right atrium potentially representing thrombus as well. Electronically Signed   By: Ashley Royalty M.D.   On: 03/02/2016 20:59   Result Date: 03/02/2016 CLINICAL DATA:  Metastatic esophageal cancer with dyspnea. EXAM: CT ANGIOGRAPHY CHEST WITH CONTRAST TECHNIQUE: Multidetector CT imaging of the chest was performed using the standard protocol during bolus administration of intravenous contrast. Multiplanar CT image reconstructions and MIPs were obtained to evaluate the vascular anatomy. CONTRAST:  80 cc of Isovue 370 IV COMPARISON:  02/09/2016 chest CT FINDINGS: Cardiovascular: Acute bilateral lobar through segmental right upper, lobar through subsegmental right lower and left upper lobar through segmental pulmonary emboli are noted with right heart strain (RV/ LV ratio = 1). Calcified coronary atherosclerosis with minimal calcified aortic atherosclerosis. Top normal size cardiac chambers without pericardial effusion. Mediastinum/Nodes: Masslike abnormality in the distal esophagus is again  noted involving the lower third with single wall thickness up to 2.1 cm unchanged in appearance. Associated posterior and subcarinal mediastinal lymphadenopathy is  again identified without significant change. Superimposed mild AP window adenopathy to 12 mm short axis, paraesophageal adenopathy to 14 mm short axis and cluster of subcarinal lymph nodes are again noted slightly more prominent in the subcarinal region with slight increase in number of lymph nodes suggested. Stable left para-aortic lymphadenopathy. Lungs/Pleura: Patchy ground-glass opacities are noted the visualized lung possibly representing hypoventilatory change. There is atelectasis at the right lung base. Upper Abdomen: The GE junction appears unremarkable. Stomach is contracted to the extent visible. 12 mm indeterminate left hepatic hypodensity is suggested on current exam not apparent on previous. Musculoskeletal: Interval progression of osseous metastatic disease with interval increase in size of several lytic foci in particular the manubrium, right scapula, T2,T4, the T9 and T11 vertebral bodies. Suspicious for epidural involvement of the left at T11. Multiple osteolytic bony destruction of bilateral ribs soft tissue components. Review of the MIP images confirms the above findings. IMPRESSION: Positive for acute PE with CT evidence of right heart strain (RV/LV Ratio = 1) consistent with at least submassive (intermediate risk) PE. The presence of right heart strain has been associated with an increased risk of morbidity and mortality. Please activate Code PE by paging [phone]. Critical Value/emergent results were called by telephone at the time of interpretation on 03/02/2016 at 8:05 pm to Dr. Davonna Belling , who verbally acknowledged these results. Distal esophageal mass with progression of metastatic disease. Of note, suggestion of epidural involvement at T11 on the left. Electronically Signed: By: Ashley Royalty M.D. On: 03/02/2016 20:05     Mr Thoracic Spine W Wo Contrast  Result Date: 03/05/2016 CLINICAL DATA:  Metastatic esophageal cancer. EXAM: MRI THORACIC SPINE WITH CONTRAST TECHNIQUE: Multiplanar, multisequence MR imaging of the thoracic spine was performed following the administration of intravenous contrast. COMPARISON:  Chest CT 03/02/2016 FINDINGS: Alignment:  Normal Vertebrae: Diffuse osseous metastatic disease involving the cervical, thoracic and upper lumbar spine. Numerous enhancing lesions throughout the spine. Cord:  No cord lesions or abnormal cord enhancement.  No syrinx. Paraspinal and other soft tissues: Extensive mediastinal disease is noted along with a distal esophageal mass. There is a large lesion associated with the fifth right rib with a large soft tissue component. Disc levels: The most significant thoracic disease is located at T11 on the left side. There is tumor in the posterior aspect of the vertebral body on the left side which extends into the expanded left pedicle and into the left neural foramen, left lateral spinal canal, lamina and transverse process. Epidural tumor is displacing the cord slightly to the right. There is also left-sided T12 tumor extending out through the lateral cortex of vertebral body and areas tumor in the right pedicle of L1. Multilevel shallow disc protrusions are noted. I do not see any other areas of spinal canal indication by tumor. IMPRESSION: 1. Diffuse osseous metastatic disease involving the cervical spine, thoracic spine and upper lumbar spine. 2. No cord lesions. 3. The most significant thoracic metastatic disease is located at T11 on the left side with there is extensive tumor in the posterior aspect of the 2 body, in the left pedicle can left lamina and transverse process. There is also tumor bulging slightly into the left neural foramen at T11-12. There is epidural tumor on left side and there is mild mass effect on the left side of the thecal sac. Electronically Signed   By:  Marijo Sanes M.D.   On: 03/05/2016 19:42   Nm Bone Scan Whole Body  Result Date: 03/02/2016  CLINICAL DATA:  History of esophageal cancer.  Back and chest pain. EXAM: NUCLEAR MEDICINE WHOLE BODY BONE SCAN TECHNIQUE: Whole body anterior and posterior images were obtained approximately 3 hours after intravenous injection of radiopharmaceutical. RADIOPHARMACEUTICALS:  26.0 mCi Technetium-4mMDP IV COMPARISON:  Abdomen and pelvis CT, 03/02/2016. Chest CT, 02/09/2016. FINDINGS: There multiple areas of abnormal radiotracer localization consistent with metastatic disease to bone, corresponding to lytic lesions noted on the prior CTs. Specifically, there are small foci of uptake in the calvarium. Abnormal uptake is seen in the sternum and involving multiple ribs as well as the right scapula. There is uptake from the spine most prominently from the T10. More mild appearing uptake is noted in the pelvis. There is uptake involving the right mid femur. There also areas of degenerative uptake involving the sternoclavicular joints knees and feet. Renal uptake is symmetric. IMPRESSION: Multiple areas of abnormal uptake consistent with metastatic disease to bone, corresponding to lytic bone lesions noted on the recent chest and abdomen and pelvis CTs. Electronically Signed   By: DLajean ManesM.D.   On: 03/02/2016 16:40   Ct Abdomen Pelvis W Contrast  Result Date: 03/02/2016 CLINICAL DATA:  Patient with esophageal carcinoma. EXAM: CT ABDOMEN AND PELVIS WITH CONTRAST TECHNIQUE: Multidetector CT imaging of the abdomen and pelvis was performed using the standard protocol following bolus administration of intravenous contrast. CONTRAST:  100 cc Isovue-300 COMPARISON:  Chest CT 02/09/2016. FINDINGS: Lower chest: There is masslike thickening of the distal esophagus (image 4; series 201. Enlarged periaortic lymph nodes about the distal descending thoracic aorta measuring up to 1.4 cm (image 8; series 201). Suggestion of  possible filling defect within a right lower lobe pulmonary artery (image 4; series 201). Additionally within the subpleural right lower lobe there is triangular-shaped ground-glass and consolidative opacity (image 15; series 205). Hepatobiliary: Multiple low-attenuation lesions are demonstrated throughout the liver with a reference lesion in the left hepatic lobe measuring 1.2 cm (image 9; series 201) and a reference lesion within the inferior right hepatic lobe measuring 5.0 x 3.3 cm (image 35; series 201). Patient status post cholecystectomy. Pancreas: Within the pancreatic tail there is a 1.5 cm enhancing lesion (image 33; series 201). The remainder the pancreas is unremarkable. Spleen: Unremarkable Adrenals/Urinary Tract: Mild nodularity of the adrenal glands bilaterally. Kidneys enhance symmetrically with contrast. Urinary bladder is unremarkable. Too small to characterize subcentimeter partially exophytic lesion off the interpolar region of the right kidney (image 32; series 201). Stomach/Bowel: No evidence for bowel obstruction. Stool throughout the colon. No free fluid or free intraperitoneal air. Normal morphology of the stomach. Two adjacent prominent small bowel mesenteric lymph nodes measuring up to 10 mm (image 42; series 2 with 3), potentially reactive. Metastatic lesions not excluded. Vascular/Lymphatic: Normal caliber abdominal aorta. Peripheral calcified atherosclerotic plaque. Retroaortic left renal vein. Reproductive: Prostate is unremarkable. Other: Small bilateral fat containing inguinal hernias. Musculoskeletal: Multiple lytic lesions are demonstrated throughout the visualized ribs, thoracic spine, lumbar spine as well as the pelvis. There is a 4.5 x 2.2 cm lytic lesion with soft tissue mass involving the posterior aspect of the T11 vertebral body which appears to narrow the spinal canal (image 14; series 201). Reference 4.3 cm lytic lesion within the left ilium (image 64; series 201).  IMPRESSION: Findings suggestive of embolus within the visualized right lower lobe pulmonary arteries. There is an adjacent triangular-shaped subpleural ground-glass and consolidative opacity within the right lower lobe which is favored represent associated pulmonary infarct. Consider dedicated evaluation with  CTA chest. Masslike thickening of the distal esophagus compatible with primary esophageal carcinoma. Multiple enlarged para-aortic lymph nodes compatible with metastatic disease. Additionally there are multiple metastatic lesions involving the liver and visualized skeleton. Note is made of a large lytic lesion with soft tissue mass involving the T11 vertebral body which appears to narrow the spinal canal at this level. Nonspecific enhancing soft tissue mass at the level of the distal pancreas which may represent enhancing primary pancreatic lesion versus splenule. Metastatic disease is considered less likely. Critical Value/emergent results were called by telephone at the time of interpretation on 03/02/2016 at 2:16 pm to Dr. Marin Olp, who verbally acknowledged these results. Electronically Signed   By: Lovey Newcomer M.D.   On: 03/02/2016 14:17   Dg Shoulder Left  Result Date: 03/02/2016 CLINICAL DATA:  Felt pop at left shoulder when getting up. Initial encounter. EXAM: LEFT SHOULDER - 2+ VIEW COMPARISON:  None. FINDINGS: There is no evidence of fracture or dislocation. The left humeral head is seated within the glenoid fossa. The acromioclavicular joint is unremarkable in appearance. No significant soft tissue abnormalities are seen. The visualized portions of the left lung are clear. IMPRESSION: No evidence of fracture or dislocation. Electronically Signed   By: Garald Balding M.D.   On: 03/02/2016 19:55   MR thoracic spine with / without contrast 03/05/2016 IMPRESSION: 1. Diffuse osseous metastatic disease involving the cervical spine, thoracic spine and upper lumbar spine. 2. No cord lesions. 3. The  most significant thoracic metastatic disease is located at T11 on the left side with there is extensive tumor in the posterior aspect of the 2 body, in the left pedicle can left lamina and transverse process. There is also tumor bulging slightly into the left neural foramen at T11-12. There is epidural tumor on left side and there is mild mass effect on the left side of the thecal sac.  ASSESSMENT & PLAN:  61 y.o. Caucasian male, with past medical history of hypertension, presented with bilateral chest pain and dysphagia  1. 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 -His previous restaging CT scan showed significant disease progression in his bones, especially the T 11 lesion. I reviewed the CT and MRI scan in our GI tumor Board this morning, and discussed with radiation oncologist Dr. Lisbeth Renshaw who recommends palliative radiation to his T11 lesion as soon as possible, to prevent cord compression. -He has started palliative radiation, including the primary tumor, for his dysphagia -He has started systemic chemotherapy FOLFOX, first cycle tolerate well. Due to his current radiation, I'll hold his chemotherapy for now. Due to the short course of radiation and palliative goal of care, I do not think he needs concurrent chemoirradiation. -I plan to restart chemotherapy FOLFOX after he completes radiation.  2. Severe constipation  -Secondary to the narcotics  -Reported to the ED for constipation 1/21 and 03/28/16. He has tried laxatives, but not regularly. -He will use high-dose MiraLAX bomb (5 scoops) today, and  repeat tomorrow morning if needed, and 2 he has a large bowel movement. -I have recommended Senna-S 2 tab BID. Cut the dose if he starts having diarrhea. -Prescribe Lactulose.  3. Bilateral acute submassive PE  -This was discovered on his previous staging CT scan, he was not much symptomatic -His oxygen level has improved after he started anticoagulation, he does not need oxygen supplement. -We'll continue Xarelto indefinitely. We discussed the pros and cons of right options of anticoagulation. If he has issues with bleeding on Xarelto, I would recommend him to switch to Coumadin, which is more reversible.  4. Hypercalcemia -likely secondary to his underlying malignancy -he received zometa on 02/22/16 -His calcium has normalized, 11.7  -follow-up for his calcium level closely  5. Dysphagia, weight loss -I encouraged him to take nutrition supplement, such as ensure boost, at least 3 bottles daily  -He has started palliative radiation, his dysphagia is stable  6. Chest pain -Secondary to his bone metastasis -He is on MS contin '15mg'$  q8h, and liquid morphine 10 mg every 4 hours as needed, tolerating well, pain is well controlled, he does not use liquid morphine much -Taking Flexeril 5-10 mg every 8 hours as needed  -Constipation and management strategies reviewed with patient again  7. Hypertension and his tachycardia -Likely secondary to his pain -We'll monitor  8. Goal of care discussion  -We previously had a lengthy discussion about his short-term and long-term goal of care.  -We previously discussed the incurable nature of his cancer, and the overall poor prognosis, especially if he does not have good response to chemotherapy or progress on chemo -The patient completely agrees with the goal of care is palliative, he is very realistic, had his power of attorney and living will drawn  -Patient states he values his quality of life more than his quality of life -I recommend DNR/DNI, he  is in full agreement.  9. Social issues -He plans to apply for disability, I have referred him to social worker to help him  Plan -We discussed constipation management in great detail -Continue radiation, plan to complete 04/02/16.  -Refill Xarelto, cyclobenzaprine, and pain medication.  -I'll see him back in 1 week, and plan to restart chemotherapy FOLFOX next Thursday.  All questions were answered. The patient knows to call the clinic with any problems, questions or concerns.  I spent 20 minutes counseling the patient face to face. The total time spent in the appointment was 25 minutes and more than 50% was on counseling.  This document serves as a record of services personally performed by Truitt Merle, MD. It was created on her behalf by Martinique Casey, a trained medical scribe. The creation of this record is based on the scribe's personal observations and the provider's statements to them. This document has been checked and approved by the attending provider.  I have reviewed the above documentation for accuracy and completeness, and I agree with the above information.     Truitt Merle, MD 03/29/2016

## 2016-03-28 ENCOUNTER — Emergency Department (HOSPITAL_COMMUNITY)
Admission: EM | Admit: 2016-03-28 | Discharge: 2016-03-28 | Disposition: A | Discharge status: Home / Self Care | Payer: BLUE CROSS/BLUE SHIELD | Attending: Emergency Medicine | Admitting: Emergency Medicine

## 2016-03-28 ENCOUNTER — Telehealth: Payer: Self-pay | Admitting: *Deleted

## 2016-03-28 ENCOUNTER — Ambulatory Visit: Payer: BLUE CROSS/BLUE SHIELD

## 2016-03-28 ENCOUNTER — Encounter (HOSPITAL_COMMUNITY): Payer: Self-pay

## 2016-03-28 DIAGNOSIS — Z8501 Personal history of malignant neoplasm of esophagus: Secondary | ICD-10-CM | POA: Insufficient documentation

## 2016-03-28 DIAGNOSIS — I10 Essential (primary) hypertension: Secondary | ICD-10-CM | POA: Insufficient documentation

## 2016-03-28 DIAGNOSIS — Z85830 Personal history of malignant neoplasm of bone: Secondary | ICD-10-CM | POA: Insufficient documentation

## 2016-03-28 DIAGNOSIS — Z87891 Personal history of nicotine dependence: Secondary | ICD-10-CM | POA: Diagnosis not present

## 2016-03-28 DIAGNOSIS — K5900 Constipation, unspecified: Secondary | ICD-10-CM

## 2016-03-28 MED ORDER — FLEET ENEMA 7-19 GM/118ML RE ENEM
1.0000 | ENEMA | Freq: Once | RECTAL | Status: AC
  Administered 2016-03-28: 1 via RECTAL
  Filled 2016-03-28: qty 1

## 2016-03-28 MED ORDER — MILK AND MOLASSES ENEMA
1.0000 | Freq: Once | RECTAL | Status: AC
Start: 2016-03-28 — End: 2016-03-28
  Administered 2016-03-28: 250 mL via RECTAL
  Filled 2016-03-28: qty 250

## 2016-03-28 MED ORDER — LIDOCAINE HCL 2 % EX GEL
1.0000 "application " | Freq: Once | CUTANEOUS | Status: AC
  Administered 2016-03-28: 1
  Filled 2016-03-28: qty 11

## 2016-03-28 MED ORDER — POLYETHYLENE GLYCOL 3350 17 GM/SCOOP PO POWD: ORAL | 0 refills | Status: AC

## 2016-03-28 NOTE — Telephone Encounter (Signed)
Patient left VM that he was having a problem with RT and wanted return call. Navigator returned call and left VM reminding him of his appointment tomorrow with Dr. Burr Medico and to call back if his issue can't wait until he sees her tomorrow.

## 2016-03-28 NOTE — ED Notes (Signed)
Bed: [id number] Expected date:  Expected time:  Means of arrival:  Comments: EMS-res a

## 2016-03-28 NOTE — Telephone Encounter (Signed)
"  I need to talk with navigator.  I'm in a lot of pain.  I cancelled today's radiation appointment due to my physical discomfort.  I have extreme constipation.  Went to ED Sunday.  Last normal BM was five to six days ago.  I had to digitally removal stool today.  I take stool softeners.  Tried milk of magnesia. Dulcolax and today took ten ounces of magnesium citrate.  The RT staff mentioned senokot.  I am to see Dr. Burr Medico tomorrow but want this on record and I missed a call from the navigator."  Will notify both of this call and can return call to patient as warranted.

## 2016-03-28 NOTE — ED Notes (Signed)
MD at bedside. 

## 2016-03-28 NOTE — ED Triage Notes (Signed)
Patient c/o constipation x 3 weeks. Patient states he was in the Ed recently and had a disimpaction and enemas, but has not gone since coming to the ED. Patient states he tried to disimpact himself

## 2016-03-28 NOTE — ED Provider Notes (Signed)
[facility] DEPT Provider Note   CSN: [id number] Arrival date & time: 03/28/16  1834     History   Chief Complaint Chief Complaint  Patient presents with  . Constipation  . cancer patient    esophageal cancer  . Rectal Bleeding    HPI Walter Craig is a 61 y.o. male.  The history is provided by the patient.  Constipation   This is a recurrent problem. The current episode started more than 1 week ago. Stool description: none passed. Associated symptoms include flatus. Pertinent negatives include no abdominal pain. He has tried nothing for the symptoms. Risk factors: chronic pain meds.    Past Medical History:  Diagnosis Date  . Anxiety   . Bone cancer ([place])    t=11 lytic lesion  . Cancer ([place]) 02/2016   low esophagus cancer, mets   . Depression   . Depression with anxiety   . 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 HLD (hyperlipidemia)   . Hypertension   . Pulmonary embolism ([place])   . Sleep apnea     Patient Active Problem List   Diagnosis Date Noted  . Goals of care, counseling/discussion 03/07/2016  . Bone metastasis ([place]) 03/07/2016  . PE (pulmonary thromboembolism) ([place]) 03/02/2016  . Pulmonary embolism ([place]) 03/02/2016  . Hypokalemia 03/02/2016  . Essential hypertension   . HLD (hyperlipidemia)   . Anxiety   . Hypercalcemia 02/28/2016  . Cancer related pain 02/28/2016  . Esophageal cancer, stage IV ([place]) 02/22/2016    Past Surgical History:  Procedure Laterality Date  . CHOLECYSTECTOMY    . IR GENERIC HISTORICAL  02/24/2016   IR US GUIDE VASC ACCESS RIGHT 02/24/2016 Greggory Keen, MD [facility]  . IR GENERIC HISTORICAL  02/24/2016   IR FLUORO GUIDE PORT INSERTION RIGHT 02/24/2016 Greggory Keen, MD [facility]  . left knee meniscus repare          Home Medications    Prior to Admission medications   Medication Sig Start Date End Date Taking? Authorizing Provider  ALPRAZolam Duanne Moron) 0.5 MG tablet Take 0.5 mg by  mouth at bedtime as needed for sleep.   Yes Historical Provider, MD  cyclobenzaprine (FLEXERIL) 5 MG tablet Take 1-2 tablets (5-10 mg total) by mouth 3 (three) times daily as needed for muscle spasms. 03/07/16  Yes Truitt Merle, MD  dexamethasone (DECADRON) 4 MG tablet Take 0.5 tablets (2 mg total) by mouth 2 (two) times daily. 03/08/16  Yes Hayden Pedro, PA-C  diclofenac (VOLTAREN) 75 MG EC tablet Take 75 mg by mouth 2 (two) times daily as needed (pain/inflammation).  02/01/16  Yes Historical Provider, MD  emollient (BIAFINE) cream Apply 1 application topically 2 (two) times daily. 03/12/16  Yes Hayden Pedro, PA-C  lidocaine-prilocaine (EMLA) cream Apply to affected area once Patient taking differently: Apply 1 application topically once as needed (prior to port access).  02/28/16  Yes Truitt Merle, MD  magnesium citrate SOLN Take 1 Bottle by mouth once.   Yes Historical Provider, MD  metoprolol succinate (TOPROL-XL) 25 MG 24 hr tablet Take 25 mg by mouth daily.  01/23/16  Yes Historical Provider, MD  morphine (MS CONTIN) 15 MG 12 hr tablet Take 1 tablet (15 mg total) by mouth every 8 (eight) hours. 02/28/16  Yes Truitt Merle, MD  Morphine Sulfate (MORPHINE CONCENTRATE) 10 MG/0.5ML SOLN concentrated solution Take 0.25-0.5 mLs (5-10 mg total) by mouth every 4 (four) hours as needed for severe pain. 03/01/16  Yes  Maryanna Shape, NP  Rivaroxaban 15 & 20 MG TBPK Take as directed on package: Start with one 15mg  tablet by mouth twice a day with food. On Day 22, switch to one 20mg  tablet once a day with food. 03/06/16  Yes Doreatha Lew, MD  sucralfate (CARAFATE) 1 g tablet Take 1 tablet (1 g total) by mouth 4 (four) times daily. 03/23/16  Yes Kyung Rudd, MD  zolpidem (AMBIEN) 10 MG tablet Take 10 mg by mouth at bedtime.  01/20/16  Yes Historical Provider, MD  ondansetron (ZOFRAN) 8 MG tablet Take 1 tablet (8 mg total) by mouth 2 (two) times daily as needed for refractory nausea / vomiting. Start on day  3 after chemotherapy. Patient not taking: Reported on 03/16/2016 02/28/16   Truitt Merle, MD    Family History Family History  Problem Relation Age of Onset  . Cancer Father     colon cancer   . Cancer Sister     colon cancer   . Cancer Brother 62    gallbladder cancer     Social History Social History  Substance Use Topics  . Smoking status: Former Smoker    Packs/day: 1.50    Years: 30.00    Quit date: 03/05/2004  . Smokeless tobacco: Never Used  . Alcohol use No     Comment: rarely      Allergies   Patient has no known allergies.   Review of Systems Review of Systems  Gastrointestinal: Positive for constipation and flatus. Negative for abdominal pain.  All other systems reviewed and are negative.    Physical Exam Updated Vital Signs BP 106/67 (BP Location: Left Arm)   Pulse (!) 130   Temp 97.9 F (36.6 C) (Oral)   Resp 18   Ht 5\' 10"  (1.778 m)   Wt 204 lb (92.5 kg)   SpO2 96%   BMI 29.27 kg/m   Physical Exam  Constitutional: He is oriented to person, place, and time. He appears well-developed and well-nourished. No distress.  HENT:  Head: Normocephalic and atraumatic.  Nose: Nose normal.  Eyes: Conjunctivae are normal.  Neck: Neck supple. No tracheal deviation present.  Cardiovascular: Normal rate and regular rhythm.   Pulmonary/Chest: Effort normal. No respiratory distress.  Abdominal: Soft. He exhibits no distension.  Genitourinary: Rectal exam shows external hemorrhoid.  Genitourinary Comments: Large impacted stool in vault  Neurological: He is alert and oriented to person, place, and time.  Skin: Skin is warm and dry.  Psychiatric: He has a normal mood and affect.     ED Treatments / Results  Labs (all labs ordered are listed, but only abnormal results are displayed) Labs Reviewed - No data to display  EKG  EKG Interpretation  Date/Time:  Wednesday March 28 2016 19:07:05 EST Ventricular Rate:  129 PR Interval:    QRS Duration: 85 QT  Interval:  291 QTC Calculation: 427 R Axis:   65 Text Interpretation:  Sinus tachycardia Nonspecific T abnormalities, diffuse leads Since last tracing rate faster Otherwise no significant change Confirmed by Molly Savarino MD, Tj Kitchings [id number]) on 03/28/2016 8:42:13 PM       Radiology No results found.  Procedures Procedures (including critical care time)  Medications Ordered in ED Medications  sodium phosphate (FLEET) 7-19 GM/118ML enema 1 enema (1 enema Rectal Given 03/28/16 2053)  milk and molasses enema (250 mLs Rectal Given 03/28/16 2153)  lidocaine (XYLOCAINE) 2 % jelly 1 application (1 application Other Given 03/28/16 2153)     Initial  Impression / Assessment and Plan / ED Course  I have reviewed the triage vital signs and the nursing notes.  Pertinent labs & imaging results that were available during my care of the patient were reviewed by me and considered in my medical decision making (see chart for details).     61 y.o. male presents with recurrent fecal impaction. Large amount of hardened stool noted in vault on exam. On chronic pain meds for advanced cancer on chemo. Provided 2 different enemas with relief of symptoms after manual disimpaction. External hemorrhoid is likely exacerbating symptoms as well. Discussed miralax cleanout and maintenance after visit. Plan to follow up with PCP as needed and return precautions discussed for worsening or new concerning symptoms.   Final Clinical Impressions(s) / ED Diagnoses   Final diagnoses:  Constipation, unspecified constipation type    New Prescriptions Discharge Medication List as of 03/28/2016 11:05 PM    START taking these medications   Details  polyethylene glycol powder (MIRALAX) powder TAKE 6 CAPFULS OF MIRALAX IN A 32 OUNCE GATORADE AND DRINK THE WHOLE BEVERAGE FOLLOWED BY 3 CAPFULS TWICE A DAY FOR THE NEXT WEEK AND FOLLOW UP WITH YOUR PRIMARY CARE PHYSICIAN., Print         Leo Grosser, MD 03/29/16 [phone]

## 2016-03-28 NOTE — ED Notes (Addendum)
Pt reports rectal bleeding; MD informed

## 2016-03-28 NOTE — ED Notes (Signed)
EKG given to Dr Pickering 

## 2016-03-29 ENCOUNTER — Ambulatory Visit
Admission: RE | Admit: 2016-03-29 | Discharge: 2016-03-29 | Disposition: A | Discharge status: Home / Self Care | Payer: BLUE CROSS/BLUE SHIELD | Source: Ambulatory Visit | Attending: Radiation Oncology | Admitting: Radiation Oncology

## 2016-03-29 ENCOUNTER — Telehealth: Payer: Self-pay | Admitting: *Deleted

## 2016-03-29 ENCOUNTER — Ambulatory Visit (HOSPITAL_BASED_OUTPATIENT_CLINIC_OR_DEPARTMENT_OTHER): Payer: BLUE CROSS/BLUE SHIELD | Admitting: Hematology

## 2016-03-29 ENCOUNTER — Other Ambulatory Visit (HOSPITAL_BASED_OUTPATIENT_CLINIC_OR_DEPARTMENT_OTHER): Payer: BLUE CROSS/BLUE SHIELD

## 2016-03-29 ENCOUNTER — Ambulatory Visit: Payer: BLUE CROSS/BLUE SHIELD

## 2016-03-29 VITALS — BP 98/75 | HR 118 | Temp 98.1°F | Resp 18 | Ht 70.0 in | Wt 200.2 lb

## 2016-03-29 DIAGNOSIS — K5909 Other constipation: Secondary | ICD-10-CM

## 2016-03-29 DIAGNOSIS — I10 Essential (primary) hypertension: Secondary | ICD-10-CM

## 2016-03-29 DIAGNOSIS — Z7189 Other specified counseling: Secondary | ICD-10-CM

## 2016-03-29 DIAGNOSIS — C7951 Secondary malignant neoplasm of bone: Secondary | ICD-10-CM | POA: Diagnosis not present

## 2016-03-29 DIAGNOSIS — C159 Malignant neoplasm of esophagus, unspecified: Secondary | ICD-10-CM

## 2016-03-29 DIAGNOSIS — G893 Neoplasm related pain (acute) (chronic): Secondary | ICD-10-CM

## 2016-03-29 DIAGNOSIS — C155 Malignant neoplasm of lower third of esophagus: Secondary | ICD-10-CM

## 2016-03-29 DIAGNOSIS — I2699 Other pulmonary embolism without acute cor pulmonale: Secondary | ICD-10-CM

## 2016-03-29 DIAGNOSIS — Z510 Encounter for antineoplastic radiation therapy: Secondary | ICD-10-CM | POA: Diagnosis not present

## 2016-03-29 LAB — COMPREHENSIVE METABOLIC PANEL
ALT: 61 U/L — AB (ref 0–55)
ANION GAP: 10 meq/L (ref 3–11)
AST: 52 U/L — ABNORMAL HIGH (ref 5–34)
Albumin: 2.8 g/dL — ABNORMAL LOW (ref 3.5–5.0)
Alkaline Phosphatase: 162 U/L — ABNORMAL HIGH (ref 40–150)
BUN: 40.1 mg/dL — ABNORMAL HIGH (ref 7.0–26.0)
CHLORIDE: 95 meq/L — AB (ref 98–109)
CO2: 28 meq/L (ref 22–29)
CREATININE: 0.8 mg/dL (ref 0.7–1.3)
Calcium: 11.7 mg/dL — ABNORMAL HIGH (ref 8.4–10.4)
EGFR: >90 mL/min/{1.73_m2} (ref >90)
GLUCOSE: 92 mg/dL (ref 70–140)
Potassium: 4.8 mEq/L (ref 3.5–5.1)
SODIUM: 133 meq/L — AB (ref 136–145)
TOTAL PROTEIN: 6 g/dL — AB (ref 6.4–8.3)
Total Bilirubin: 1.02 mg/dL (ref 0.20–1.20)

## 2016-03-29 LAB — CBC WITH DIFFERENTIAL/PLATELET
BASO%: 0.2 % (ref 0.0–2.0)
BASOS ABS: 0 10*3/uL (ref 0.0–0.1)
EOS ABS: 0.2 10*3/uL (ref 0.0–0.5)
EOS%: 2.5 % (ref 0.0–7.0)
HEMATOCRIT: 36 % — AB (ref 38.4–49.9)
HEMOGLOBIN: 12.3 g/dL — AB (ref 13.0–17.1)
LYMPH#: 0.2 10*3/uL — AB (ref 0.9–3.3)
LYMPH%: 2.2 % — ABNORMAL LOW (ref 14.0–49.0)
MCH: 31 pg (ref 27.2–33.4)
MCHC: 34.1 g/dL (ref 32.0–36.0)
MCV: 91 fL (ref 79.3–98.0)
MONO#: 0.7 10*3/uL (ref 0.1–0.9)
MONO%: 9.1 % (ref 0.0–14.0)
NEUT#: 6.6 10*3/uL — ABNORMAL HIGH (ref 1.5–6.5)
NEUT%: 86 % — ABNORMAL HIGH (ref 39.0–75.0)
Platelets: 105 10*3/uL — ABNORMAL LOW (ref 140–400)
RBC: 3.96 10*6/uL — ABNORMAL LOW (ref 4.20–5.82)
RDW: 14.3 % (ref 11.0–14.6)
WBC: 7.6 10*3/uL (ref 4.0–10.3)

## 2016-03-29 MED ORDER — CYCLOBENZAPRINE HCL 5 MG PO TABS: 5.0000 mg | ORAL_TABLET | Freq: Three times a day (TID) | ORAL | 0 refills | Status: DC | PRN

## 2016-03-29 MED ORDER — LACTULOSE 20 GM/30ML PO SOLN: 20.0000 g | Freq: Three times a day (TID) | ORAL | 2 refills | Status: DC | PRN

## 2016-03-29 MED ORDER — RIVAROXABAN 20 MG PO TABS: 20.0000 mg | ORAL_TABLET | Freq: Every day | ORAL | 2 refills | Status: DC

## 2016-03-29 MED ORDER — MORPHINE SULFATE ER 15 MG PO TBCR: 15.0000 mg | EXTENDED_RELEASE_TABLET | Freq: Three times a day (TID) | ORAL | 0 refills | Status: DC

## 2016-03-29 NOTE — Telephone Encounter (Signed)
Received call from son, Lovena Le who was concerned about pt & wanted to make sure that he told Dr Burr Medico everything.  He states that he spit up blood x 1 yest  & he has hemorrhoids & also wants to make sure what pt needs to do regarding bowel regimen.  He states father is very stubborn & doesn't like to go to doctor & may not tell everything. Informed that Dr Burr Medico saw pt today & pt was given regimen in ED to follow but not sure what Dr Burr Medico suggested.  Lovena Le states that he is not on HIPPA list but he will make sure that is taken care of.  Message to Dr Burr Medico.

## 2016-03-29 NOTE — Telephone Encounter (Signed)
Son, Lovena Le is calling.  He would like to speak to Dr. Ernestina Penna nurse, Joellen Jersey that he talked to last night at 6pm.  Let him know that at that hour, our on call service would have answered and Joellen Jersey works with the on-call service.  His Dad went to the ED last night with severe constipation.  Let Lovena Le know that we are not able to discuss his Dad's care with him as he has not signed a ROI form giving [place] that permission.  He wanted to know if it was ok that his Dad left the ED last night.  Let him know that I could not answer that.  His Dad has an appointment with Dr. Burr Medico today and he will try and encourage him to keep that appointment so we can discuss the issue of constipation and the blood thinners that he says his Dad is on.

## 2016-03-30 ENCOUNTER — Ambulatory Visit: Payer: BLUE CROSS/BLUE SHIELD

## 2016-03-30 ENCOUNTER — Ambulatory Visit
Admission: RE | Admit: 2016-03-30 | Discharge: 2016-03-30 | Disposition: A | Discharge status: Home / Self Care | Payer: BLUE CROSS/BLUE SHIELD | Source: Ambulatory Visit | Attending: Radiation Oncology | Admitting: Radiation Oncology

## 2016-03-30 VITALS — BP 95/77 | HR 100 | Temp 98.3°F | Resp 20

## 2016-03-30 DIAGNOSIS — C7951 Secondary malignant neoplasm of bone: Secondary | ICD-10-CM

## 2016-03-30 DIAGNOSIS — Z510 Encounter for antineoplastic radiation therapy: Secondary | ICD-10-CM | POA: Diagnosis not present

## 2016-03-30 NOTE — Progress Notes (Signed)
Weekly rad xts spine mets,  Back pain 6/10 scale, took morphine  1230 pm, constipation, started miralax, today low b/p, feels a little disoriented  And weak,   Appetite fair, need to drink more fluids, difficulty swallowing no skin changes 3:06 PM BP 95/77 (BP Location: Left Arm, Patient Position: Sitting, Cuff Size: Normal)   Pulse 100   Temp 98.3 F (36.8 C) (Oral)   Resp 20   Wt Readings from Last 3 Encounters:  03/29/16 200 lb 3.2 oz (90.8 kg)  03/28/16 204 lb (92.5 kg)  03/25/16 204 lb (92.5 kg)

## 2016-03-30 NOTE — Progress Notes (Signed)
Department of Radiation Oncology  Phone:  [phone] Fax:        [phone]  Weekly Treatment Note    Name: Walter Craig Date: 03/30/2016 MRN: [id number] DOB: 08/23/1955   Diagnosis:     ICD-9-CM ICD-10-CM   1. Bone metastasis ([facility]) 198.5 C79.51      Current dose: 35 Gy  Current fraction:14   MEDICATIONS: Current Outpatient Prescriptions  Medication Sig Dispense Refill  . ALPRAZolam (XANAX) 0.5 MG tablet Take 0.5 mg by mouth at bedtime as needed for sleep.    . cyclobenzaprine (FLEXERIL) 5 MG tablet Take 1-2 tablets (5-10 mg total) by mouth 3 (three) times daily as needed for muscle spasms. 60 tablet 0  . dexamethasone (DECADRON) 4 MG tablet Take 0.5 tablets (2 mg total) by mouth 2 (two) times daily. 30 tablet 0  . diclofenac (VOLTAREN) 75 MG EC tablet Take 75 mg by mouth 2 (two) times daily as needed (pain/inflammation).   1  . emollient (BIAFINE) cream Apply 1 application topically 2 (two) times daily.    . Lactulose 20 GM/30ML SOLN Take 30 mLs (20 g total) by mouth every 8 (eight) hours as needed. 473 mL 2  . lidocaine-prilocaine (EMLA) cream Apply to affected area once (Patient taking differently: Apply 1 application topically once as needed (prior to port access). ) 30 g 3  . metoprolol succinate (TOPROL-XL) 25 MG 24 hr tablet Take 25 mg by mouth daily.   1  . morphine (MS CONTIN) 15 MG 12 hr tablet Take 1 tablet (15 mg total) by mouth every 8 (eight) hours. 90 tablet 0  . Morphine Sulfate (MORPHINE CONCENTRATE) 10 MG/0.5ML SOLN concentrated solution Take 0.25-0.5 mLs (5-10 mg total) by mouth every 4 (four) hours as needed for severe pain. 30 mL 0  . ondansetron (ZOFRAN) 8 MG tablet Take 1 tablet (8 mg total) by mouth 2 (two) times daily as needed for refractory nausea / vomiting. Start on day 3 after chemotherapy. 30 tablet 1  . polyethylene glycol powder (MIRALAX) powder TAKE 6 CAPFULS OF MIRALAX IN A 32 OUNCE GATORADE AND DRINK THE WHOLE BEVERAGE FOLLOWED BY 3  CAPFULS TWICE A DAY FOR THE NEXT WEEK AND FOLLOW UP WITH YOUR PRIMARY CARE PHYSICIAN. 500 g 0  . rivaroxaban (XARELTO) 20 MG TABS tablet Take 1 tablet (20 mg total) by mouth daily with supper. 30 tablet 2  . sucralfate (CARAFATE) 1 g tablet Take 1 tablet (1 g total) by mouth 4 (four) times daily. 120 tablet 2  . zolpidem (AMBIEN) 10 MG tablet Take 10 mg by mouth at bedtime.   0  . magnesium citrate SOLN Take 1 Bottle by mouth once.     No current facility-administered medications for this encounter.      ALLERGIES: Patient has no known allergies.   LABORATORY DATA:  Lab Results  Component Value Date   WBC 7.6 03/29/2016   HGB 12.3 (L) 03/29/2016   HCT 36.0 (L) 03/29/2016   MCV 91.0 03/29/2016   PLT 105 (L) 03/29/2016   Lab Results  Component Value Date   NA 133 (L) 03/29/2016   K 4.8 03/29/2016   CL 102 03/25/2016   CO2 28 03/29/2016   Lab Results  Component Value Date   ALT 61 (H) 03/29/2016   AST 52 (H) 03/29/2016   ALKPHOS 162 (H) 03/29/2016   BILITOT 1.02 03/29/2016     NARRATIVE: Walter Craig was seen today for weekly treatment management. The chart was checked  and the patient's films were reviewed.  The patient is doing well in his final week of treatment. He will finish on Monday. He eighth of his pain is improved as compared to prior to treatment but he also has increased his pain medicine. Constipation earlier this week in which he went to the emergency room. This is improved but he is continuing to watch this. We did discuss management of this to some degree today.  PHYSICAL EXAMINATION: oral temperature is 98.3 F (36.8 C). His blood pressure is 95/77 and his pulse is 100. His respiration is 20.        ASSESSMENT: The patient is doing satisfactorily with treatment.  PLAN: We will continue with the patient's radiation treatment as planned.  The patient will finish on Monday and then follow-up in our clinic in approximately one month.

## 2016-03-31 ENCOUNTER — Encounter: Payer: Self-pay | Admitting: Hematology

## 2016-04-02 ENCOUNTER — Telehealth: Payer: Self-pay

## 2016-04-02 ENCOUNTER — Ambulatory Visit
Admission: RE | Admit: 2016-04-02 | Discharge: 2016-04-02 | Disposition: A | Discharge status: Home / Self Care | Payer: BLUE CROSS/BLUE SHIELD | Source: Ambulatory Visit | Attending: Radiation Oncology | Admitting: Radiation Oncology

## 2016-04-02 ENCOUNTER — Encounter: Payer: Self-pay | Admitting: Hematology

## 2016-04-02 DIAGNOSIS — Z510 Encounter for antineoplastic radiation therapy: Secondary | ICD-10-CM | POA: Diagnosis not present

## 2016-04-02 NOTE — Telephone Encounter (Signed)
LBM in 2 weeks. He did use 5 scoops miralax on 1/25 and 2 scoops on 1/26 with no results. He has taken nothing else since then. He states Dr Burr Medico mentioned a shot that will help him. When this rn mentioned mag citrate and dulcolax he stated "that is just substituting on thing for another and I would rather have the shot".  S/w Myrtle and she will approach Dr Burr Medico.

## 2016-04-02 NOTE — Telephone Encounter (Signed)
Talked with pt after discussing with Dr Burr Medico & requested he take lactulase every 2 hours until he has a BM.  OK to add Senna S bid & can back off if he has diarrhea.  Encouraged to call if no BM.  May have to resort back to Miralax bomb prn.  Dr Burr Medico will talk to pharmacist regarding injection for narcotic induced constipation.

## 2016-04-02 NOTE — Progress Notes (Unsigned)
Patient walked in to inquire about grants. Introduced myself as his financial advocate and explained what we do. Reviewed chart and there are currently no funds available for his diagnosis. Advised patient that there are currently no funds available for his diagnosis for co-pay assistance. Asked patient if he was familiar with his plan as far as what insurance will pay and what he will be responsible for. Patient states he does not know. Advised patient to contact his plan to get an explanation on detailed benefits. Patient verbalized understanding. Spoke with patient about the [employer] and proof of income is needed to apply. Patient interested in applying for the grant. Patient not sure when he will begin treatment, but will bring his last paystub when he returns.Patient has my card for any additional financial questions or concerns.

## 2016-04-03 ENCOUNTER — Telehealth: Payer: Self-pay | Admitting: Hematology

## 2016-04-03 NOTE — Telephone Encounter (Signed)
Spoke with patient re 2/6 appointments. Patient will get new schedule 2/6. Appointments scheduled per 1/25 los and 1/29 schedule message.

## 2016-04-04 ENCOUNTER — Telehealth: Payer: Self-pay | Admitting: *Deleted

## 2016-04-04 ENCOUNTER — Ambulatory Visit (HOSPITAL_BASED_OUTPATIENT_CLINIC_OR_DEPARTMENT_OTHER): Payer: BLUE CROSS/BLUE SHIELD | Admitting: Nurse Practitioner

## 2016-04-04 ENCOUNTER — Other Ambulatory Visit: Payer: Self-pay

## 2016-04-04 ENCOUNTER — Other Ambulatory Visit: Payer: Self-pay | Admitting: *Deleted

## 2016-04-04 ENCOUNTER — Encounter (HOSPITAL_COMMUNITY): Payer: Self-pay | Admitting: Internal Medicine

## 2016-04-04 ENCOUNTER — Emergency Department (HOSPITAL_COMMUNITY): Payer: BLUE CROSS/BLUE SHIELD

## 2016-04-04 ENCOUNTER — Inpatient Hospital Stay (HOSPITAL_COMMUNITY)
Admission: EM | Admit: 2016-04-04 | Discharge: 2016-04-06 | DRG: 640 | Disposition: A | Discharge status: Hospice | Payer: BLUE CROSS/BLUE SHIELD | Attending: Internal Medicine | Admitting: Internal Medicine

## 2016-04-04 ENCOUNTER — Encounter: Payer: Self-pay | Admitting: Nurse Practitioner

## 2016-04-04 ENCOUNTER — Encounter (HOSPITAL_COMMUNITY): Payer: Self-pay | Admitting: Emergency Medicine

## 2016-04-04 ENCOUNTER — Ambulatory Visit (HOSPITAL_BASED_OUTPATIENT_CLINIC_OR_DEPARTMENT_OTHER): Payer: BLUE CROSS/BLUE SHIELD

## 2016-04-04 DIAGNOSIS — T451X5A Adverse effect of antineoplastic and immunosuppressive drugs, initial encounter: Secondary | ICD-10-CM | POA: Diagnosis present

## 2016-04-04 DIAGNOSIS — C159 Malignant neoplasm of esophagus, unspecified: Secondary | ICD-10-CM

## 2016-04-04 DIAGNOSIS — C155 Malignant neoplasm of lower third of esophagus: Secondary | ICD-10-CM

## 2016-04-04 DIAGNOSIS — R4702 Dysphasia: Secondary | ICD-10-CM | POA: Diagnosis present

## 2016-04-04 DIAGNOSIS — E871 Hypo-osmolality and hyponatremia: Secondary | ICD-10-CM | POA: Diagnosis present

## 2016-04-04 DIAGNOSIS — E875 Hyperkalemia: Secondary | ICD-10-CM | POA: Diagnosis present

## 2016-04-04 DIAGNOSIS — E860 Dehydration: Secondary | ICD-10-CM | POA: Diagnosis present

## 2016-04-04 DIAGNOSIS — T40605A Adverse effect of unspecified narcotics, initial encounter: Secondary | ICD-10-CM | POA: Diagnosis present

## 2016-04-04 DIAGNOSIS — G9340 Encephalopathy, unspecified: Secondary | ICD-10-CM | POA: Diagnosis not present

## 2016-04-04 DIAGNOSIS — D6481 Anemia due to antineoplastic chemotherapy: Secondary | ICD-10-CM | POA: Diagnosis present

## 2016-04-04 DIAGNOSIS — C7800 Secondary malignant neoplasm of unspecified lung: Secondary | ICD-10-CM | POA: Diagnosis present

## 2016-04-04 DIAGNOSIS — Z87891 Personal history of nicotine dependence: Secondary | ICD-10-CM

## 2016-04-04 DIAGNOSIS — E785 Hyperlipidemia, unspecified: Secondary | ICD-10-CM | POA: Diagnosis present

## 2016-04-04 DIAGNOSIS — I10 Essential (primary) hypertension: Secondary | ICD-10-CM | POA: Diagnosis present

## 2016-04-04 DIAGNOSIS — D696 Thrombocytopenia, unspecified: Secondary | ICD-10-CM | POA: Diagnosis present

## 2016-04-04 DIAGNOSIS — E43 Unspecified severe protein-calorie malnutrition: Secondary | ICD-10-CM | POA: Diagnosis present

## 2016-04-04 DIAGNOSIS — Y842 Radiological procedure and radiotherapy as the cause of abnormal reaction of the patient, or of later complication, without mention of misadventure at the time of the procedure: Secondary | ICD-10-CM | POA: Diagnosis present

## 2016-04-04 DIAGNOSIS — C7951 Secondary malignant neoplasm of bone: Secondary | ICD-10-CM | POA: Diagnosis present

## 2016-04-04 DIAGNOSIS — Z7901 Long term (current) use of anticoagulants: Secondary | ICD-10-CM

## 2016-04-04 DIAGNOSIS — K5903 Drug induced constipation: Secondary | ICD-10-CM | POA: Diagnosis present

## 2016-04-04 DIAGNOSIS — Z86711 Personal history of pulmonary embolism: Secondary | ICD-10-CM

## 2016-04-04 DIAGNOSIS — Z7401 Bed confinement status: Secondary | ICD-10-CM

## 2016-04-04 DIAGNOSIS — R1319 Other dysphagia: Secondary | ICD-10-CM

## 2016-04-04 DIAGNOSIS — R9431 Abnormal electrocardiogram [ECG] [EKG]: Secondary | ICD-10-CM | POA: Diagnosis present

## 2016-04-04 DIAGNOSIS — F418 Other specified anxiety disorders: Secondary | ICD-10-CM | POA: Diagnosis present

## 2016-04-04 DIAGNOSIS — G9341 Metabolic encephalopathy: Secondary | ICD-10-CM | POA: Diagnosis present

## 2016-04-04 DIAGNOSIS — G9349 Other encephalopathy: Secondary | ICD-10-CM | POA: Diagnosis not present

## 2016-04-04 DIAGNOSIS — R1310 Dysphagia, unspecified: Secondary | ICD-10-CM

## 2016-04-04 DIAGNOSIS — D649 Anemia, unspecified: Secondary | ICD-10-CM | POA: Diagnosis not present

## 2016-04-04 DIAGNOSIS — Z800 Family history of malignant neoplasm of digestive organs: Secondary | ICD-10-CM

## 2016-04-04 DIAGNOSIS — Z515 Encounter for palliative care: Secondary | ICD-10-CM | POA: Diagnosis not present

## 2016-04-04 DIAGNOSIS — K5900 Constipation, unspecified: Secondary | ICD-10-CM

## 2016-04-04 DIAGNOSIS — R000 Tachycardia, unspecified: Secondary | ICD-10-CM | POA: Diagnosis not present

## 2016-04-04 DIAGNOSIS — Z6828 Body mass index (BMI) 28.0-28.9, adult: Secondary | ICD-10-CM

## 2016-04-04 DIAGNOSIS — I2699 Other pulmonary embolism without acute cor pulmonale: Secondary | ICD-10-CM | POA: Diagnosis present

## 2016-04-04 DIAGNOSIS — G893 Neoplasm related pain (acute) (chronic): Secondary | ICD-10-CM | POA: Diagnosis present

## 2016-04-04 DIAGNOSIS — R1314 Dysphagia, pharyngoesophageal phase: Secondary | ICD-10-CM | POA: Diagnosis present

## 2016-04-04 DIAGNOSIS — Z6827 Body mass index (BMI) 27.0-27.9, adult: Secondary | ICD-10-CM

## 2016-04-04 DIAGNOSIS — R4182 Altered mental status, unspecified: Secondary | ICD-10-CM | POA: Diagnosis not present

## 2016-04-04 DIAGNOSIS — Z9049 Acquired absence of other specified parts of digestive tract: Secondary | ICD-10-CM

## 2016-04-04 DIAGNOSIS — Z66 Do not resuscitate: Secondary | ICD-10-CM | POA: Diagnosis not present

## 2016-04-04 DIAGNOSIS — G4730 Sleep apnea, unspecified: Secondary | ICD-10-CM | POA: Diagnosis present

## 2016-04-04 DIAGNOSIS — K208 Other esophagitis: Secondary | ICD-10-CM | POA: Diagnosis present

## 2016-04-04 LAB — COMPREHENSIVE METABOLIC PANEL
ALBUMIN: 2.6 g/dL — AB (ref 3.5–5.0)
ALK PHOS: 190 U/L — AB (ref 40–150)
ALK PHOS: 145 U/L — AB (ref 38–126)
ALT: 37 U/L (ref 0–55)
ALT: 35 U/L (ref 17–63)
ANION GAP: 9 (ref 5–15)
AST: 45 U/L — ABNORMAL HIGH (ref 5–34)
AST: 45 U/L — ABNORMAL HIGH (ref 15–41)
Albumin: 2.5 g/dL — ABNORMAL LOW (ref 3.5–5.0)
Anion Gap: 11 mEq/L (ref 3–11)
BILIRUBIN TOTAL: 1 mg/dL (ref 0.20–1.20)
BUN: 30.6 mg/dL — ABNORMAL HIGH (ref 7.0–26.0)
BUN: 33 mg/dL — ABNORMAL HIGH (ref 6–20)
CALCIUM: 13.5 mg/dL — AB (ref 8.9–10.3)
CO2: 28 mEq/L (ref 22–29)
CO2: 28 mmol/L (ref 22–32)
Calcium: 14 mg/dL (ref 8.4–10.4)
Chloride: 97 mEq/L — ABNORMAL LOW (ref 98–109)
Chloride: 97 mmol/L — ABNORMAL LOW (ref 101–111)
Creatinine, Ser: 0.99 mg/dL (ref 0.61–1.24)
Creatinine: 0.9 mg/dL (ref 0.7–1.3)
GFR calc Af Amer: >60 mL/min (ref >60)
GFR calc non Af Amer: >60 mL/min (ref >60)
Glucose, Bld: 108 mg/dL — ABNORMAL HIGH (ref 65–99)
Glucose: 125 mg/dl (ref 70–140)
Potassium: 4 mEq/L (ref 3.5–5.1)
Potassium: 3.7 mmol/L (ref 3.5–5.1)
SODIUM: 134 mmol/L — AB (ref 135–145)
Sodium: 136 mEq/L (ref 136–145)
TOTAL PROTEIN: 5.8 g/dL — AB (ref 6.4–8.3)
Total Bilirubin: 1.1 mg/dL (ref 0.3–1.2)
Total Protein: 5.6 g/dL — ABNORMAL LOW (ref 6.5–8.1)

## 2016-04-04 LAB — CBC WITH DIFFERENTIAL/PLATELET
BASO%: 0.3 % (ref 0.0–2.0)
BASOS ABS: 0 10*3/uL (ref 0.0–0.1)
BASOS ABS: 0 10*3/uL (ref 0.0–0.1)
BASOS PCT: 0 %
EOS%: 5.7 % (ref 0.0–7.0)
Eosinophils Absolute: 0.3 10*3/uL (ref 0.0–0.5)
Eosinophils Absolute: 0.4 10*3/uL (ref 0.0–0.7)
Eosinophils Relative: 6 %
HCT: 30.5 % — ABNORMAL LOW (ref 38.4–49.9)
HCT: 27.8 % — ABNORMAL LOW (ref 39.0–52.0)
HEMOGLOBIN: 10.3 g/dL — AB (ref 13.0–17.1)
HEMOGLOBIN: 9.5 g/dL — AB (ref 13.0–17.0)
LYMPH%: 2.5 % — AB (ref 14.0–49.0)
Lymphocytes Relative: 7 %
Lymphs Abs: 0.4 10*3/uL — ABNORMAL LOW (ref 0.7–4.0)
MCH: 30.7 pg (ref 27.2–33.4)
MCH: 30.8 pg (ref 26.0–34.0)
MCHC: 33.8 g/dL (ref 32.0–36.0)
MCHC: 34.2 g/dL (ref 30.0–36.0)
MCV: 90.7 fL (ref 79.3–98.0)
MCV: 90.3 fL (ref 78.0–100.0)
MONO#: 0.5 10*3/uL (ref 0.1–0.9)
MONO%: 8.6 % (ref 0.0–14.0)
Monocytes Absolute: 0.3 10*3/uL (ref 0.1–1.0)
Monocytes Relative: 6 %
NEUT%: 82.9 % — ABNORMAL HIGH (ref 39.0–75.0)
NEUTROS ABS: 4.5 10*3/uL (ref 1.5–6.5)
NEUTROS ABS: 4.9 10*3/uL (ref 1.7–7.7)
NEUTROS PCT: 82 %
PLATELETS: 48 10*3/uL — AB (ref 140–400)
Platelets: DECREASED 10*3/uL (ref 150–400)
RBC: 3.36 10*6/uL — AB (ref 4.20–5.82)
RBC: 3.08 MIL/uL — ABNORMAL LOW (ref 4.22–5.81)
RDW: 14.7 % — ABNORMAL HIGH (ref 11.0–14.6)
RDW: 14.8 % (ref 11.5–15.5)
WBC: 5.5 10*3/uL (ref 4.0–10.3)
WBC: 6 10*3/uL (ref 4.0–10.5)
lymph#: 0.1 10*3/uL — ABNORMAL LOW (ref 0.9–3.3)

## 2016-04-04 LAB — I-STAT TROPONIN, ED: Troponin i, poc: 0.05 ng/mL (ref 0.00–0.08)

## 2016-04-04 MED ORDER — SODIUM CHLORIDE 0.9 % IV BOLUS (SEPSIS)
1000.0000 mL | Freq: Once | INTRAVENOUS | Status: AC
  Administered 2016-04-04: 1000 mL via INTRAVENOUS

## 2016-04-04 MED ORDER — ZOLEDRONIC ACID 4 MG/5ML IV CONC
4.0000 mg | Freq: Once | INTRAVENOUS | Status: AC
  Administered 2016-04-05: 4 mg via INTRAVENOUS
  Filled 2016-04-04: qty 5

## 2016-04-04 MED ORDER — MORPHINE SULFATE (PF) 4 MG/ML IV SOLN
6.0000 mg | Freq: Once | INTRAVENOUS | Status: AC
  Administered 2016-04-04: 6 mg via INTRAVENOUS
  Filled 2016-04-04: qty 2

## 2016-04-04 MED ORDER — ONDANSETRON HCL 4 MG/2ML IJ SOLN
4.0000 mg | Freq: Once | INTRAMUSCULAR | Status: AC
  Administered 2016-04-04: 4 mg via INTRAVENOUS
  Filled 2016-04-04: qty 2

## 2016-04-04 MED ORDER — CALCITONIN (SALMON) 200 UNIT/ML IJ SOLN
354.0000 [IU] | Freq: Two times a day (BID) | INTRAMUSCULAR | Status: AC
  Administered 2016-04-05 – 2016-04-06 (×4): 354 [IU] via SUBCUTANEOUS
  Filled 2016-04-04 (×4): qty 1.77

## 2016-04-04 NOTE — Care Management Note (Signed)
Case Management Note  Patient Details  Name: Walter Craig MRN: [id number] Date of Birth: 1956-01-12  Subjective/Objective:                  Metastatic Cancer  Action/Plan:  [facility] spoke with the patient at the bedside. Patient needs assistance with the cost of Xarelto. Reported to the admission nurse that he has received a voucher for 2 months worth of assistance. Patient agrees to CM completing the information required online for a $0 co-pay card via the manufacturer. Patient provided a print out of the $0 co-pay card and instructed to take the card with him to the pharmacy.    Expected Discharge Date:   (unknown)               Expected Discharge Plan:  Home/Self Care  In-House Referral:     Discharge planning Services  Medication Assistance  Post Acute Care Choice:    Choice offered to:     DME Arranged:    DME Agency:     HH Arranged:    HH Agency:     Status of Service:  In process, will continue to follow  If discussed at Long Length of Stay Meetings, dates discussed:    Additional Comments:  Apolonio Schneiders, RN 04/04/2016, 9:00 PM

## 2016-04-04 NOTE — Progress Notes (Signed)
Pt transferred to ED via w/c room # 72 with daughter in [place].  Port has been accessed though did not get blood return. Reported this to RN in ED.

## 2016-04-04 NOTE — ED Notes (Signed)
Pt from cancer center sent due to calcium of 15.1 and confusion. Pt came to cancer center for dehydration. Pt has hx of esophageal cancer metastasized to bone.

## 2016-04-04 NOTE — Assessment & Plan Note (Signed)
Patient is suffering from dysphagia secondary to his recent esophageal radiation treatments.  He feels dehydrated today.  Patient had his Port-A-Cath accessed while in the [place] today; and then was transferred to the emergency department for further evaluation and management today.

## 2016-04-04 NOTE — ED Notes (Signed)
Pt refused to allow blood to be drawn at this time and stated that he does not want  To be stuck again. Pt did however agree when he gets admitted into a room and he is more comfortable he will be willing for labs to be drawn.

## 2016-04-04 NOTE — Telephone Encounter (Signed)
Voice mail from daughter, Janett Billow requesting to speak with Dr. Burr Medico or nurse as soon as possible. She expresses concern with his current condition. Message forwarded to collaborative nurse.

## 2016-04-04 NOTE — Telephone Encounter (Signed)
Dtr., Walter Craig is calling.  She has not seen patient for 2 weeks and is surprised by his rapid decline.  He can not stand to use a urinal because he is too weak.  He is in constant pain.  He is not eating.  He only had one Boost and one popsicle yesterday.  She feels like he needs a difficult conversation about the realities of no eating.  She also has multiple insurance questions about disability and providing a nurse in the home.  Advised her to talk with Stefanie Libel whom the patient has already talked with. She will bring her Dad in today to see Walter Lesser NP.  Dtr. Does not feel like he needs to see Dr. Burr Medico and talk about cancer treatment, but rather he needs to be seen regarding symptom management.

## 2016-04-04 NOTE — Progress Notes (Signed)
SYMPTOM MANAGEMENT CLINIC    Chief Complaint: Hypercalcemia  HPI:  Walter Craig 61 y.o. male diagnosed with esophageal cancer with bone metastasis.  Patient is status post chemotherapy and just completed her radiation treatments this past Monday, 04/02/2016.  Oncology History   Esophageal cancer, stage IV ([place])   Staging form: Esophagus - Adenocarcinoma, AJCC 8th Edition   - Clinical: Stage IVB (cTX, cNX, cM1) - Signed by Truitt Merle, MD on 03/15/2016      Esophageal cancer, stage IV ([place])   02/09/2016 Imaging    CT chest with contrast showed constellation of abnormal posterior mediastinum and lytic osseous metastatic disease most compatible with stage IV esophageal cancer, distal thoracic esophagus masslike soft tissue thickening and irregularity up to 2 cm. Multiple destructive rib metastasis with pathological fractures. Multiple thoracic and upper lumbar vertebral metastasis, without suspected epidural extension.      02/15/2016 Procedure     esophageal mucosal change consistent with long segment Barrett's esophagus, a large malignant mass was found in the distal esophagus, partially obstructing and 3/4 circumferential.      02/15/2016 Initial Biopsy    Distal esophageal mass biopsy showed adenocarcinoma, arising in the background of intestinal metaplasia associate with high-grade granular dysplasia. Positive for lymphovascular invasion.      02/15/2016 Initial Diagnosis    Esophageal cancer, stage IV ([place])      03/01/2016 -  Chemotherapy    FOLFOX every 2 weeks, held during radiation       03/02/2016 Imaging    CT and her chest showed acute pulmonary embolism in bilateral lobar through segmental right upper, right low and left upper pulmonary arteries with CT evidence of right heart strain, consistent with at least submassive PE      03/05/2016 Imaging    MR Thoracic spine with/without contrast 1. Diffuse osseous metastatic disease involving the cervical spine, thoracic  spine and upper lumber spine. 2. No cord lesions. 3. The most significant thoracic metastatic disease is located at T11 on the left side where there is extensive tumor in the posterior aspect of the 2 body, in the left pedicle can left lamina and transverse process. There is also tumor bulging slightly into the left neural foramen at T11-12. There is epidural tumor on left side and there is mild mass effect on the left side of the thecal sac.      03/09/2016 Miscellaneous    Insufficient tissue for Her-2 testing. PD-L1 was positive (03/21/16)      03/12/2016 -  Radiation Therapy    -Palliative radiation to establish her cancer and T10-11 bone metastasis  -15 fractions over 3 weeks -Finishes on 04/02/16       Review of Systems  Constitutional: Positive for malaise/fatigue and weight loss.  HENT: Positive for sore throat.   Gastrointestinal: Positive for constipation.  Neurological: Positive for dizziness, sensory change, speech change and weakness.  All other systems reviewed and are negative.   Past Medical History:  Diagnosis Date  . Anxiety   . Bone cancer ([place])    t=11 lytic lesion  . Cancer ([place]) 02/2016   low esophagus cancer, mets   . Depression   . Depression with anxiety   . Esophageal cancer ([place]) 02/24/2016   invasive adenocarcinoma  . HLD (hyperlipidemia)   . Hypertension   . Pulmonary embolism ([place])   . Sleep apnea     Past Surgical History:  Procedure Laterality Date  . CHOLECYSTECTOMY    . IR GENERIC HISTORICAL  02/24/2016   IR US GUIDE VASC ACCESS RIGHT 02/24/2016 Greggory Keen, MD [facility]  . IR GENERIC HISTORICAL  02/24/2016   IR FLUORO GUIDE PORT INSERTION RIGHT 02/24/2016 Greggory Keen, MD [facility]  . left knee meniscus repare       has Esophageal cancer, stage IV ([place]); Hypercalcemia of malignancy; Cancer related pain; Essential hypertension; HLD (hyperlipidemia); Anxiety; PE (pulmonary thromboembolism) ([place]); Goals of care,  counseling/discussion; Bone metastasis ([place]); Dehydration; Dysphagia; and Constipation on his problem list.    has No Known Allergies.  Allergies as of 04/04/2016   No Known Allergies     Medication List       Accurate as of 04/04/16  5:41 PM. Always use your most recent med list.          ALPRAZolam 0.5 MG tablet Commonly known as:  XANAX Take 0.5 mg by mouth at bedtime as needed for sleep.   cyclobenzaprine 5 MG tablet Commonly known as:  FLEXERIL Take 1-2 tablets (5-10 mg total) by mouth 3 (three) times daily as needed for muscle spasms.   dexamethasone 4 MG tablet Commonly known as:  DECADRON Take 0.5 tablets (2 mg total) by mouth 2 (two) times daily.   emollient cream Commonly known as:  BIAFINE Apply 1 application topically 2 (two) times daily.   Lactulose 20 GM/30ML Soln Take 30 mLs (20 g total) by mouth every 8 (eight) hours as needed.   lidocaine-prilocaine cream Commonly known as:  EMLA Apply to affected area once   metoprolol succinate 25 MG 24 hr tablet Commonly known as:  TOPROL-XL Take 25 mg by mouth daily.   morphine CONCENTRATE 10 MG/0.5ML Soln concentrated solution Take 0.25-0.5 mLs (5-10 mg total) by mouth every 4 (four) hours as needed for severe pain.   morphine 15 MG 12 hr tablet Commonly known as:  MS CONTIN Take 1 tablet (15 mg total) by mouth every 8 (eight) hours.   ondansetron 8 MG tablet Commonly known as:  ZOFRAN Take 1 tablet (8 mg total) by mouth 2 (two) times daily as needed for refractory nausea / vomiting. Start on day 3 after chemotherapy.   polyethylene glycol powder powder Commonly known as:  MIRALAX TAKE 6 CAPFULS OF MIRALAX IN A 32 OUNCE GATORADE AND DRINK THE WHOLE BEVERAGE FOLLOWED BY 3 CAPFULS TWICE A DAY FOR THE NEXT WEEK AND FOLLOW UP WITH YOUR PRIMARY CARE PHYSICIAN.   rivaroxaban 20 MG Tabs tablet Commonly known as:  XARELTO Take 1 tablet (20 mg total) by mouth daily with supper.   sucralfate 1 g tablet Commonly  known as:  CARAFATE Take 1 tablet (1 g total) by mouth 4 (four) times daily.   zolpidem 10 MG tablet Commonly known as:  AMBIEN Take 10 mg by mouth at bedtime.        PHYSICAL EXAMINATION  Oncology Vitals 04/04/2016 04/04/2016  Height 178 cm -  Weight 88.451 kg -  Weight (lbs) 195 lbs -  BMI (kg/m2) 27.98 kg/m2 -  Temp - 97.6  Pulse - 127  Resp - 18  SpO2 - 90  BSA (m2) 2.09 m2 -   BP Readings from Last 2 Encounters:  04/04/16 124/98  04/04/16 96/77    Physical Exam  Constitutional: He appears malnourished and dehydrated. He appears unhealthy. He appears cachectic.  HENT:  Head: Normocephalic and atraumatic.  Eyes: Conjunctivae and EOM are normal. Pupils are equal, round, and reactive to light.  Neck: Normal range of motion.  Pulmonary/Chest: Effort normal. No  respiratory distress.  Musculoskeletal: Normal range of motion.  Neurological:  Patient will answer all questions appropriately and will follow all commands; but does appear slightly sluggish today.  Skin: Skin is warm and dry. There is pallor.  Psychiatric:  Flat affect  Nursing note and vitals reviewed.   LABORATORY DATA:. Appointment on 04/04/2016  Component Date Value Ref Range Status  . WBC 04/04/2016 5.5  4.0 - 10.3 10e3/uL Final  . NEUT# 04/04/2016 4.5  1.5 - 6.5 10e3/uL Final  . HGB 04/04/2016 10.3* 13.0 - 17.1 g/dL Final  . HCT 04/04/2016 30.5* 38.4 - 49.9 % Final  . Platelets 04/04/2016 48* 140 - 400 10e3/uL Final  . MCV 04/04/2016 90.7  79.3 - 98.0 fL Final  . MCH 04/04/2016 30.7  27.2 - 33.4 pg Final  . MCHC 04/04/2016 33.8  32.0 - 36.0 g/dL Final  . RBC 04/04/2016 3.36* 4.20 - 5.82 10e6/uL Final  . RDW 04/04/2016 14.7* 11.0 - 14.6 % Final  . lymph# 04/04/2016 0.1* 0.9 - 3.3 10e3/uL Final  . MONO# 04/04/2016 0.5  0.1 - 0.9 10e3/uL Final  . Eosinophils Absolute 04/04/2016 0.3  0.0 - 0.5 10e3/uL Final  . Basophils Absolute 04/04/2016 0.0  0.0 - 0.1 10e3/uL Final  . NEUT% 04/04/2016 82.9* 39.0  - 75.0 % Final  . LYMPH% 04/04/2016 2.5* 14.0 - 49.0 % Final  . MONO% 04/04/2016 8.6  0.0 - 14.0 % Final  . EOS% 04/04/2016 5.7  0.0 - 7.0 % Final  . BASO% 04/04/2016 0.3  0.0 - 2.0 % Final  . Sodium 04/04/2016 136  136 - 145 mEq/L Final  . Potassium 04/04/2016 4.0  3.5 - 5.1 mEq/L Final  . Chloride 04/04/2016 97* 98 - 109 mEq/L Final  . CO2 04/04/2016 28  22 - 29 mEq/L Final  . Glucose 04/04/2016 125  70 - 140 mg/dl Final  . BUN 04/04/2016 30.6* 7.0 - 26.0 mg/dL Final  . Creatinine 04/04/2016 0.9  0.7 - 1.3 mg/dL Final  . Total Bilirubin 04/04/2016 1.00  0.20 - 1.20 mg/dL Final  . Alkaline Phosphatase 04/04/2016 190* 40 - 150 U/L Final  . AST 04/04/2016 45* 5 - 34 U/L Final  . ALT 04/04/2016 37  0 - 55 U/L Final  . Total Protein 04/04/2016 5.8* 6.4 - 8.3 g/dL Final  . Albumin 04/04/2016 2.6* 3.5 - 5.0 g/dL Final  . Calcium 04/04/2016 14.0* 8.4 - 10.4 mg/dL Final  . Anion Gap 04/04/2016 11  3 - 11 mEq/L Final  . EGFR 04/04/2016 >90  >90 ml/min/1.73 m2 Final    RADIOGRAPHIC STUDIES: Dg Chest 2 View  Result Date: 04/04/2016 CLINICAL DATA:  Acute onset of difficulty swallowing. Confusion and dehydration. Acute onset of shortness of breath and generalized weakness. Initial encounter. EXAM: CHEST  2 VIEW COMPARISON:  Chest radiograph performed 01/05/2016, and CTA of the chest performed 03/02/2016 FINDINGS: The patient's left-sided lung and rib masses are again noted. A smaller right-sided lung and rib mass is also partially characterized. No superimposed focal airspace consolidation is seen. No pleural effusion or pneumothorax identified. The heart is normal in size. A right-sided chest port is noted ending about the mid to distal SVC. Known metastatic disease along the thoracic spine is not well characterized. Clips are noted within the right upper quadrant, reflecting prior cholecystectomy. IMPRESSION: 1. Bilateral lung and rib masses again noted, larger on the left. 2. Known metastatic disease  along the thoracic spine is not well characterized on radiograph. Electronically Signed  By: Garald Balding M.D.   On: 04/04/2016 17:32    ASSESSMENT/PLAN:    Hypercalcemia of malignancy Patient is critically calcium today was 15.1.  Patient states that he has been much more fatigued; and also states that he has been feeling more confused as well.  His speech has become much slower; he sometimes searches for words as well.  Patient will be transported to the emergency department for further evaluation and management this evening.  Brief history.  Report were called to the emergency department charge nurse; prior to patient being transported to the emergency department  via wheelchair.  Per the cancer Center nurse.  CODE STATUS: Patient should be considered a full code; since there are no advanced directives in the patient's chart.    Esophageal cancer, stage IV [facility]) Patient received his last cycle of FOLFOX chemotherapy on the summer 28th 2017.  He completed his last radiation treatment to the esophageal area this past Monday, 04/02/2016.  See further notes for details of today's visit and subsequent decision to transfer patient to the emergency department.  At present-patient is scheduled for labs, flush, and his next visit to the [facility] on 04/10/2016.  He also has an infusion appointment scheduled for 04/10/2016 as well; but will need to confirm this.  Infusion appointment needs to remain on the schedule.  Dysphagia Patient just completed esophageal radiation this past Monday, 04/02/2016.  Patient has some chronic dysphasia and most likely some esophagitis as well secondary to the radiation treatments.  He states it is difficult for and swallow most foods; but he can drink water.  He does feel dehydrated today as well.  Patient will be transported to the emergency department for further evaluation and management today.  See further notes for details.  Dehydration Patient is  suffering from dysphagia secondary to his recent esophageal radiation treatments.  He feels dehydrated today.  Patient had his Port-A-Cath accessed while in the [facility] today; and then was transferred to the emergency department for further evaluation and management today.  Constipation Patient has history of chronic constipation.  Cycle is likely secondary to chronic pain medications for his bone metastasis.  He has been to the emergency department twice in the last few weeks for issues of constipation and impaction.  He feels constipated again today.  Patient will be transported to the emergency department for further evaluation and management of all of patient's complaints today.   Patient stated understanding of all instructions; and was in agreement with this plan of care. The patient knows to call the clinic with any problems, questions or concerns.   Total time spent with patient was 40 minutes;  with greater than 75 percent of that time spent in face to face counseling regarding patient's symptoms,  and coordination of care and follow up.  Disclaimer:This dictation was prepared with Dragon/digital dictation along with [employer]. Any transcriptional errors that result from this process are unintentional.  Drue Second, NP 04/04/2016

## 2016-04-04 NOTE — ED Triage Notes (Signed)
Patient was brought from cancer center due to calcium 15.12, confusion, and he is dehydrated. Patient is having problems swallowing and patient is not eating or drinking like he should. Platelets are 48 per nurse.

## 2016-04-04 NOTE — Assessment & Plan Note (Signed)
Patient has history of chronic constipation.  Cycle is likely secondary to chronic pain medications for his bone metastasis.  He has been to the emergency department twice in the last few weeks for issues of constipation and impaction.  He feels constipated again today.  Patient will be transported to the emergency department for further evaluation and management of all of patient's complaints today.

## 2016-04-04 NOTE — Assessment & Plan Note (Signed)
Patient just completed esophageal radiation this past Monday, 04/02/2016.  Patient has some chronic dysphasia and most likely some esophagitis as well secondary to the radiation treatments.  He states it is difficult for and swallow most foods; but he can drink water.  He does feel dehydrated today as well.  Patient will be transported to the emergency department for further evaluation and management today.  See further notes for details.

## 2016-04-04 NOTE — Assessment & Plan Note (Signed)
Patient received his last cycle of FOLFOX chemotherapy on the summer 28th 2017.  He completed his last radiation treatment to the esophageal area this past Monday, 04/02/2016.  See further notes for details of today's visit and subsequent decision to transfer patient to the emergency department.  At present-patient is scheduled for labs, flush, and his next visit to the [facility] on 04/10/2016.  He also has an infusion appointment scheduled for 04/10/2016 as well; but will need to confirm this.  Infusion appointment needs to remain on the schedule.

## 2016-04-04 NOTE — H&P (Signed)
GIUSEPPE OMO [id number] DOB: 11-Apr-1955 DOA: 04/04/2016     PCP: [name], MD   Outpatient Specialists: [facility]   Patient coming from:  home Lives alone,       Chief Complaint: Confusion and altered mental status  HPI: Walter Craig is a 61 y.o. 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  Patient had been having increased dysphasia he has chronic dysphagia with been getting worse he was seen by this oncology yesterday and is suspect esophagitis secondary to radiation therapy he has hardly drank any water or food secondary to discomfort. Was seen in the office by oncology and given calcium level15 sent to emergency department. Reports severe constipation. NO abdominal pain   Regarding pertinent Chronic problems: Regarding history esophageal cancer status post chemotherapy and completed radiation therapy on 29th of January 2018 Has diffuse metastatic disease involving cervical spine and thoracic spine and upper lumbar spine is post palliative radiation therapy last chemotherapy was FOLFOX chemotherapy on the summer 28th 2017. His history of hypertension treated with metoprolol History of PE on chronic anticoagulation with Xarelto  IN ER:  Temp (24hrs), Avg:97.7 F (36.5 C), Min:97.6 F (36.4 C), Max:97.8 F (36.6 C)     RR 17 oxygen saturation 97% on 3 L H I-123 BP 132/89 Troponin 0.05 WBC 6.0 hemoglobin 9.5 platelets in  clumps earlier today was 48  Calcium on repeat 13.5 creatinine 0.99 BUN 33 which is up from baseline sodium 134  CXR x-ray showing bilateral lung and repeat masses Following Medications were ordered in ER: Medications  sodium chloride 0.9 % bolus 1,000 mL (1,000 mLs Intravenous New Bag/Given 04/04/16 1750)  morphine 4 MG/ML injection 6 mg (6 mg Intravenous Given  04/04/16 1741)  ondansetron (ZOFRAN) injection 4 mg (4 mg Intravenous Given 04/04/16 1741)  sodium chloride 0.9 % bolus 1,000 mL (1,000 mLs Intravenous New Bag/Given 04/04/16 1855)      Hospitalist was called for admission for dehydration and hypercalcemia  Review of Systems:    Pertinent positives include: confusion slurred speech, fatigue  Constitutional:  No weight loss, night sweats, Fevers, chills,  weight loss  HEENT:  No headaches, Difficulty swallowing,Tooth/dental problems,Sore throat,  No sneezing, itching, ear ache, nasal congestion, post nasal drip,  Cardio-vascular:  No chest pain, Orthopnea, PND, anasarca, dizziness, palpitations.no Bilateral lower extremity swelling  GI:  No heartburn, indigestion, abdominal pain, nausea, vomiting, diarrhea, change in bowel habits, loss of appetite, melena, blood in stool, hematemesis Resp:  no shortness of breath at rest. No dyspnea on exertion, No excess mucus, no productive cough, No non-productive cough, No coughing up of blood.No change in color of mucus.No wheezing. Skin:  no rash or lesions. No jaundice GU:  no dysuria, change in color of urine, no urgency or frequency. No straining to urinate.  No flank pain.  Musculoskeletal:  No joint pain or no joint swelling. No decreased range of motion. No back pain.  Psych:  No change in mood or affect. No depression or anxiety. No memory loss.  Neuro: no localizing neurological complaints, no tingling, no weakness, no double vision, no gait abnormality, no no   As per HPI otherwise 10 point review of systems negative.   Past Medical History: Past Medical History:  Diagnosis Date  . Anxiety   . Bone cancer (  [place])    t=11 lytic lesion  . Cancer ([place]) 02/2016   low esophagus cancer, mets   . Depression   . Depression with anxiety   . Esophageal cancer ([place]) 02/24/2016   invasive adenocarcinoma  . HLD (hyperlipidemia)   . Hypertension   . Pulmonary embolism ([place])   . Sleep  apnea    Past Surgical History:  Procedure Laterality Date  . CHOLECYSTECTOMY    . IR GENERIC HISTORICAL  02/24/2016   IR US GUIDE VASC ACCESS RIGHT 02/24/2016 Greggory Keen, MD [facility]  . IR GENERIC HISTORICAL  02/24/2016   IR FLUORO GUIDE PORT INSERTION RIGHT 02/24/2016 Greggory Keen, MD [facility]  . left knee meniscus repare        Social History:  Ambulatory   walker      reports that he quit smoking about 12 years ago. He has a 45.00 pack-year smoking history. He has never used smokeless tobacco. He reports that he uses drugs, including Marijuana. He reports that he does not drink alcohol.  Allergies:  No Known Allergies     Family History:   Family History  Problem Relation Age of Onset  . Cancer Father     colon cancer   . Cancer Sister     colon cancer   . Cancer Brother 85    gallbladder cancer     Medications: Prior to Admission medications   Medication Sig Start Date End Date Taking? Authorizing Provider  cyclobenzaprine (FLEXERIL) 5 MG tablet Take 1-2 tablets (5-10 mg total) by mouth 3 (three) times daily as needed for muscle spasms. 03/29/16  Yes Truitt Merle, MD  dexamethasone (DECADRON) 4 MG tablet Take 0.5 tablets (2 mg total) by mouth 2 (two) times daily. 03/08/16  Yes Hayden Pedro, PA-C  emollient (BIAFINE) cream Apply 1 application topically 2 (two) times daily. 03/12/16  Yes Hayden Pedro, PA-C  Lactulose 20 GM/30ML SOLN Take 30 mLs (20 g total) by mouth every 8 (eight) hours as needed. Patient taking differently: Take 20 g by mouth every 8 (eight) hours as needed (constipation).  03/29/16  Yes Truitt Merle, MD  lidocaine-prilocaine (EMLA) cream Apply to affected area once Patient taking differently: Apply 1 application topically once as needed (prior to port access).  02/28/16  Yes Truitt Merle, MD  metoprolol succinate (TOPROL-XL) 25 MG 24 hr tablet Take 25 mg by mouth daily.  01/23/16  Yes Historical Provider, MD  morphine (MS CONTIN) 15  MG 12 hr tablet Take 1 tablet (15 mg total) by mouth every 8 (eight) hours. 03/29/16  Yes Truitt Merle, MD  Morphine Sulfate (MORPHINE CONCENTRATE) 10 MG/0.5ML SOLN concentrated solution Take 0.25-0.5 mLs (5-10 mg total) by mouth every 4 (four) hours as needed for severe pain. 03/01/16  Yes Maryanna Shape, NP  polyethylene glycol powder (MIRALAX) powder TAKE 6 CAPFULS OF MIRALAX IN A 32 OUNCE GATORADE AND DRINK THE WHOLE BEVERAGE FOLLOWED BY 3 CAPFULS TWICE A DAY FOR THE NEXT WEEK AND FOLLOW UP WITH YOUR PRIMARY CARE PHYSICIAN. 03/28/16  Yes Leo Grosser, MD  rivaroxaban (XARELTO) 20 MG TABS tablet Take 1 tablet (20 mg total) by mouth daily with supper. 03/29/16  Yes Truitt Merle, MD  sucralfate (CARAFATE) 1 g tablet Take 1 tablet (1 g total) by mouth 4 (four) times daily. 03/23/16  Yes Kyung Rudd, MD  zolpidem (AMBIEN) 10 MG tablet Take 10 mg by mouth at bedtime.  01/20/16  Yes Historical Provider, MD  ALPRAZolam Duanne Moron) 0.5 MG  tablet Take 0.5 mg by mouth at bedtime as needed for sleep.    Historical Provider, MD  ondansetron (ZOFRAN) 8 MG tablet Take 1 tablet (8 mg total) by mouth 2 (two) times daily as needed for refractory nausea / vomiting. Start on day 3 after chemotherapy. Patient not taking: Reported on 04/04/2016 02/28/16   Truitt Merle, MD    Physical Exam: Patient Vitals for the past 24 hrs:  BP Temp Temp src Pulse Resp SpO2 Height Weight  04/04/16 1940 119/87 - - 109 18 95 % - -  04/04/16 1747 125/88 - - (!) 124 18 95 % - -  04/04/16 1637 - - - - - - 5\' 10"  (1.778 m) 88.5 kg (195 lb)  04/04/16 1636 124/98 97.6 F (36.4 C) Oral (!) 127 18 90 % - -    1. General:  in No Acute distress 2. Psychological: Alert and  Oriented 3. Head/ENT:    Dry Mucous Membranes                          Head Non traumatic, neck supple                            Poor Dentition 4. SKIN:   decreased Skin turgor,  Skin clean Dry and intact no rash 5. Heart: Regular rate and rhythm no Murmur, Rub or gallop 6. Lungs: no  wheezes or crackles   7. Abdomen: Soft,  non-tender, Non distended 8. Lower extremities: no clubbing, cyanosis, or edema 9. Neurologically Grossly intact, moving all 4 extremities equally  10. MSK: Normal range of motion   body mass index is 27.98 kg/m.  Labs on Admission:   Labs on Admission: I have personally reviewed following labs and imaging studies  CBC:  Recent Labs Lab 03/29/16 1313 04/04/16 1445 04/04/16 1750  WBC 7.6 5.5 6.0  NEUTROABS 6.6* 4.5 4.9  HGB 12.3* 10.3* 9.5*  HCT 36.0* 30.5* 27.8*  MCV 91.0 90.7 90.3  PLT 105* 48* PLATELET CLUMPS NOTED ON SMEAR, COUNT APPEARS DECREASED   Basic Metabolic Panel:  Recent Labs Lab 03/29/16 1314 04/04/16 1446 04/04/16 1750  NA 133* 136 134*  K 4.8 4.0 3.7  CL  --   --  97*  CO2 28 28 28   GLUCOSE 92 125 108*  BUN 40.1* 30.6* 33*  CREATININE 0.8 0.9 0.99  CALCIUM 11.7* 14.0* 13.5*   GFR: Estimated Creatinine Clearance: 90 mL/min (by C-G formula based on SCr of 0.99 mg/dL). Liver Function Tests:  Recent Labs Lab 03/29/16 1314 04/04/16 1446 04/04/16 1750  AST 52* 45* 45*  ALT 61* 37 35  ALKPHOS 162* 190* 145*  BILITOT 1.02 1.00 1.1  PROT 6.0* 5.8* 5.6*  ALBUMIN 2.8* 2.6* 2.5*   No results for input(s): LIPASE, AMYLASE in the last 168 hours. No results for input(s): AMMONIA in the last 168 hours. Coagulation Profile: No results for input(s): INR, PROTIME in the last 168 hours. Cardiac Enzymes: No results for input(s): CKTOTAL, CKMB, CKMBINDEX, TROPONINI in the last 168 hours. BNP (last 3 results) No results for input(s): PROBNP in the last 8760 hours. HbA1C: No results for input(s): HGBA1C in the last 72 hours. CBG: No results for input(s): GLUCAP in the last 168 hours. Lipid Profile: No results for input(s): CHOL, HDL, LDLCALC, TRIG, CHOLHDL, LDLDIRECT in the last 72 hours. Thyroid Function Tests: No results for input(s): TSH, T4TOTAL, FREET4, T3FREE, THYROIDAB  in the last 72 hours. Anemia  Panel: No results for input(s): VITAMINB12, FOLATE, FERRITIN, TIBC, IRON, RETICCTPCT in the last 72 hours. Urine analysis:    Component Value Date/Time   COLORURINE YELLOW 03/25/2016 [place] 03/25/2016 1730   LABSPEC 1.018 03/25/2016 1730   PHURINE 5.0 03/25/2016 1730   GLUCOSEU 50 (A) 03/25/2016 1730   HGBUR NEGATIVE 03/25/2016 [place] 03/25/2016 [place] 03/25/2016 1730   PROTEINUR NEGATIVE 03/25/2016 1730   NITRITE NEGATIVE 03/25/2016 1730   LEUKOCYTESUR NEGATIVE 03/25/2016 1730   Sepsis Labs: @LABRCNTIP (procalcitonin:4,lacticidven:4) )No results found for this or any previous visit (from the past 240 hour(s)).    UA   no evidence of UTI   No results found for: HGBA1C  Estimated Creatinine Clearance: 90 mL/min (by C-G formula based on SCr of 0.99 mg/dL).  BNP (last 3 results) No results for input(s): PROBNP in the last 8760 hours.   ECG REPORT  Independently reviewed Rate: 125  Rhythm: junctional tachycardia ST&T Change: No acute ischemic changes   QTC 592  Filed Weights   04/04/16 1637  Weight: 88.5 kg (195 lb)     Cultures: No results found for: SDES, SPECREQUEST, CULT, REPTSTATUS   Radiological Exams on Admission: Dg Chest 2 View  Result Date: 04/04/2016 CLINICAL DATA:  Acute onset of difficulty swallowing. Confusion and dehydration. Acute onset of shortness of breath and generalized weakness. Initial encounter. EXAM: CHEST  2 VIEW COMPARISON:  Chest radiograph performed 01/05/2016, and CTA of the chest performed 03/02/2016 FINDINGS: The patient's left-sided lung and rib masses are again noted. A smaller right-sided lung and rib mass is also partially characterized. No superimposed focal airspace consolidation is seen. No pleural effusion or pneumothorax identified. The heart is normal in size. A right-sided chest port is noted ending about the mid to distal SVC. Known metastatic disease along the thoracic  spine is not well characterized. Clips are noted within the right upper quadrant, reflecting prior cholecystectomy. IMPRESSION: 1. Bilateral lung and rib masses again noted, larger on the left. 2. Known metastatic disease along the thoracic spine is not well characterized on radiograph. Electronically Signed   By: Garald Balding M.D.   On: 04/04/2016 17:32    Chart has been reviewed    Assessment/Plan  61 y.o. 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 Dehydration rehydrate aggressively likely dehydration secondary to poor by mouth intake . Esophageal cancer, stage IV [facility]) - will notify oncology patient has been admitted . Essential hypertension restart home medications . Hypercalcemia of malignancy  is secondary to bone metastases and also dehydration. We will rehydrate aggressively give calcitonin and Zometa will need to have close follow up as an outpatient. . Prolonged QT interval - - will monitor on tele avoid QT prolonging medications, rehydrate correct electrolytes  . Bone metastasis ([place]) likely cause of hypercalcemia will continue supportive management . Cancer related pain continue home medications . History of PE (pulmonary thromboembolism) ([place]) continue Xarelto she is at high risk of recurrence currently denies any chest pain or shortness of breath Tachycardia suspect seconded to dehydration and pain and possible inability to take metoprolol per records he had had persistent cardiac the past 2 weeks maybe associated dehydration monitor on telemetry he start home medications Dysphagia/odynophagia will have speech pathology evaluation likely secondary to  esophagitis secondary to radiation treatments supportive management Thrombocytopenia - new appreciate hematology oncology input Constipation - likely secondary to  hypercalcemia as well as chronic pain medications Other plan as per orders.  DVT prophylaxis:  SCD    Code Status:  Limited code as per patient    Family Communication:   Family at  Bedside  plan of care was discussed with  Daughter   Disposition Plan:   To home once workup is complete and patient is stable                        Would benefit from PT/OT eval prior to DC   ordered                       nutrition    consulted                          Consults called: in epic Oncology   Admission status:     inpatient      Level of care    tele        I have spent a total of 56 min on this admission   Honor Fairbank 04/04/2016, 11:37 PM    [facility]  Pager [phone]   after 2 AM please page floor coverage PA If 7AM-7PM, please contact the day team taking care of the patient  [email or web address]  Password [email or web address]

## 2016-04-04 NOTE — ED Notes (Signed)
Patient in xray 

## 2016-04-04 NOTE — Assessment & Plan Note (Signed)
Patient is critically calcium today was 15.1.  Patient states that he has been much more fatigued; and also states that he has been feeling more confused as well.  His speech has become much slower; he sometimes searches for words as well.  Patient will be transported to the emergency department for further evaluation and management this evening.  Brief history.  Report were called to the emergency department charge nurse; prior to patient being transported to the emergency department  via wheelchair.  Per the cancer Center nurse.  CODE STATUS: Patient should be considered a full code; since there are no advanced directives in the patient's chart.

## 2016-04-04 NOTE — ED Provider Notes (Signed)
[facility] DEPT Provider Note   CSN: [id number] Arrival date & time: 04/04/16  1629     History   Chief Complaint Chief Complaint  Patient presents with  . Dysphagia    HPI Walter Craig is a 61 y.o. male.  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  Patient brought here by family. Family member noticed that patient has a rapid decline in his state of health within the past 2 weeks. He cannot stand to use urinal because he is too weak. He is in constant pain Involving his neck from metastatic cancer currently taking morphine. He is eating less and drinking less. Patient states he is having difficulty with both liquid and solid. States he has decrease in appetite, having constipation and it is difficult for him to swallow. This is been an ongoing issue. He recently finished with his 15 course of radiation, with a last treatment 2 days ago. He is also more confused, disoriented than his baseline.  [place] noted that his blood work today showing elevated calcium level of 15.2. They felt that the level is likely the cause of his increased confusion. He is currently reports some mild shortness of breath related to increased exertion today than his baseline. He was diagnosed with a PE recently and currently on Xarelto. She denies any fever, productive cough, active chest pain.   Past Medical History:  Diagnosis Date  . Anxiety   . Bone cancer ([place])    t=11 lytic lesion  . Cancer ([place]) 02/2016   low esophagus cancer, mets   . Depression   . Depression with anxiety   . Esophageal cancer ([place]) 02/24/2016   invasive adenocarcinoma  . HLD (hyperlipidemia)   . Hypertension   . Pulmonary embolism ([place])   . Sleep apnea     Patient Active Problem List   Diagnosis Date Noted  . Goals of care, counseling/discussion 03/07/2016  . Bone metastasis ([place]) 03/07/2016  . PE (pulmonary  thromboembolism) ([place]) 03/02/2016  . Pulmonary embolism ([place]) 03/02/2016  . Hypokalemia 03/02/2016  . Essential hypertension   . HLD (hyperlipidemia)   . Anxiety   . Hypercalcemia 02/28/2016  . Cancer related pain 02/28/2016  . Esophageal cancer, stage IV ([place]) 02/22/2016    Past Surgical History:  Procedure Laterality Date  . CHOLECYSTECTOMY    . IR GENERIC HISTORICAL  02/24/2016   IR US GUIDE VASC ACCESS RIGHT 02/24/2016 Greggory Keen, MD [facility]  . IR GENERIC HISTORICAL  02/24/2016   IR FLUORO GUIDE PORT INSERTION RIGHT 02/24/2016 Greggory Keen, MD [facility]  . left knee meniscus repare          Home Medications    Prior to Admission medications   Medication Sig Start Date End Date Taking? Authorizing Provider  ALPRAZolam Duanne Moron) 0.5 MG tablet Take 0.5 mg by mouth at bedtime as needed for sleep.    Historical Provider, MD  cyclobenzaprine (FLEXERIL) 5 MG tablet Take 1-2 tablets (5-10 mg total) by mouth 3 (three) times daily as needed for muscle spasms. 03/29/16   Truitt Merle, MD  dexamethasone (DECADRON) 4 MG tablet Take 0.5 tablets (2 mg total) by mouth 2 (two) times daily. Patient not taking: Reported on 04/04/2016 03/08/16   Hayden Pedro, PA-C  diclofenac (VOLTAREN) 75 MG EC tablet Take 75 mg by mouth 2 (two) times daily as needed (pain/inflammation).  02/01/16   Historical Provider,  MD  emollient (BIAFINE) cream Apply 1 application topically 2 (two) times daily. 03/12/16   Hayden Pedro, PA-C  Lactulose 20 GM/30ML SOLN Take 30 mLs (20 g total) by mouth every 8 (eight) hours as needed. 03/29/16   Truitt Merle, MD  lidocaine-prilocaine (EMLA) cream Apply to affected area once Patient taking differently: Apply 1 application topically once as needed (prior to port access).  02/28/16   Truitt Merle, MD  magnesium citrate SOLN Take 1 Bottle by mouth once.    Historical Provider, MD  metoprolol succinate (TOPROL-XL) 25 MG 24 hr tablet Take 25 mg by mouth daily.   01/23/16   Historical Provider, MD  morphine (MS CONTIN) 15 MG 12 hr tablet Take 1 tablet (15 mg total) by mouth every 8 (eight) hours. 03/29/16   Truitt Merle, MD  Morphine Sulfate (MORPHINE CONCENTRATE) 10 MG/0.5ML SOLN concentrated solution Take 0.25-0.5 mLs (5-10 mg total) by mouth every 4 (four) hours as needed for severe pain. 03/01/16   Maryanna Shape, NP  ondansetron (ZOFRAN) 8 MG tablet Take 1 tablet (8 mg total) by mouth 2 (two) times daily as needed for refractory nausea / vomiting. Start on day 3 after chemotherapy. Patient not taking: Reported on 04/04/2016 02/28/16   Truitt Merle, MD  polyethylene glycol powder (MIRALAX) powder TAKE 6 CAPFULS OF MIRALAX IN A 32 OUNCE GATORADE AND DRINK THE WHOLE BEVERAGE FOLLOWED BY 3 CAPFULS TWICE A DAY FOR THE NEXT WEEK AND FOLLOW UP WITH YOUR PRIMARY CARE PHYSICIAN. 03/28/16   Leo Grosser, MD  rivaroxaban (XARELTO) 20 MG TABS tablet Take 1 tablet (20 mg total) by mouth daily with supper. 03/29/16   Truitt Merle, MD  sucralfate (CARAFATE) 1 g tablet Take 1 tablet (1 g total) by mouth 4 (four) times daily. 03/23/16   Kyung Rudd, MD  zolpidem (AMBIEN) 10 MG tablet Take 10 mg by mouth at bedtime.  01/20/16   Historical Provider, MD    Family History Family History  Problem Relation Age of Onset  . Cancer Father     colon cancer   . Cancer Sister     colon cancer   . Cancer Brother 4    gallbladder cancer     Social History Social History  Substance Use Topics  . Smoking status: Former Smoker    Packs/day: 1.50    Years: 30.00    Quit date: 03/05/2004  . Smokeless tobacco: Never Used  . Alcohol use No     Comment: rarely      Allergies   Patient has no known allergies.   Review of Systems Review of Systems  All other systems reviewed and are negative.    Physical Exam Updated Vital Signs BP 124/98 (BP Location: Left Arm)   Pulse (!) 127   Temp 97.6 F (36.4 C) (Oral)   Resp 18   Ht 5\' 10"  (1.778 m)   Wt 88.5 kg   SpO2 90%   BMI  27.98 kg/m   Physical Exam  Constitutional: He is oriented to person, place, and time. He appears well-developed and well-nourished. No distress.  Patient appears older than stated age  HENT:  Head: Atraumatic.  Mouth/Throat: Oropharynx is clear and moist.  Eyes: Conjunctivae are normal.  Neck: Neck supple.  Cardiovascular:  Tachycardia without murmur rubs or gallops  Pulmonary/Chest: Effort normal and breath sounds normal.  Abdominal: Soft. There is no tenderness.  Musculoskeletal: He exhibits no edema.  Global weakness with equal strength to all 4 extremities and  intact distal pulses.  Neurological: He is alert and oriented to person, place, and time. No cranial nerve deficit. GCS eye subscore is 4. GCS verbal subscore is 5. GCS motor subscore is 6.  Skin: No rash noted.  Psychiatric: He has a normal mood and affect.  Nursing note and vitals reviewed.    ED Treatments / Results  Labs (all labs ordered are listed, but only abnormal results are displayed) Labs Reviewed  CBC WITH DIFFERENTIAL/PLATELET - Abnormal; Notable for the following:       Result Value   RBC 3.08 (*)    Hemoglobin 9.5 (*)    HCT 27.8 (*)    Lymphs Abs 0.4 (*)    All other components within normal limits  COMPREHENSIVE METABOLIC PANEL - Abnormal; Notable for the following:    Sodium 134 (*)    Chloride 97 (*)    Glucose, Bld 108 (*)    BUN 33 (*)    Calcium 13.5 (*)    Total Protein 5.6 (*)    Albumin 2.5 (*)    AST 45 (*)    Alkaline Phosphatase 145 (*)    All other components within normal limits  I-STAT TROPOININ, ED    EKG  EKG Interpretation None     ED ECG REPORT   Date: 04/04/2016  Rate: 125  Rhythm: junctional tachycardia  QRS Axis: normal  Intervals: QT prolonged  ST/T Wave abnormalities: normal  Conduction Disutrbances:none  Narrative Interpretation:   Old EKG Reviewed: changes noted  I have personally reviewed the EKG tracing and agree with the computerized printout as  noted.   Radiology No results found.  Procedures Procedures (including critical care time)  Medications Ordered in ED Medications  sodium chloride 0.9 % bolus 1,000 mL (1,000 mLs Intravenous New Bag/Given 04/04/16 1750)  morphine 4 MG/ML injection 6 mg (6 mg Intravenous Given 04/04/16 1741)  ondansetron (ZOFRAN) injection 4 mg (4 mg Intravenous Given 04/04/16 1741)  sodium chloride 0.9 % bolus 1,000 mL (1,000 mLs Intravenous New Bag/Given 04/04/16 1855)     Initial Impression / Assessment and Plan / ED Course  I have reviewed the triage vital signs and the nursing notes.  Pertinent labs & imaging results that were available during my care of the patient were reviewed by me and considered in my medical decision making (see chart for details).     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 Dysphagia likely esophagitis 2/2 recent radiation, or it could be due to esophageal cancer.  Tolerates fluid but not solid.  Also has an elevated calcium of 15.2 according to his cancer doctor which may contribute to his weakness and confusion. Plan to provide IV fluid as patient does appears dehydrated and is tachycardic with a heart rate of 107. He does report mild shortness breath today. Was diagnosed with PE and currently on Xarelto. He may need to be reassessed for potential unresolved PE or  acute PE.  C/o constipation, likely 2/2 narcotic pain meds. Care discussed with Dr. Ellender Hose  6:53 PM Calcium is 13.5 today.  Low albumin of 2.5.   Chest Xray without acute infiltrates.  Pt received IVF.  Plan to have pt admitted for further management.  Pt report feeling better with IVF and pain med currently.  Pt is a full code.    8:34 PM Appreciate consultation from [facility] Dr. Roel Cluck who will see pt in the ER and will admit for further care.     Domenic Moras, PA-C 04/04/16 2035    Duffy Bruce, MD 04/06/16 [phone]

## 2016-04-04 NOTE — ED Notes (Signed)
Writer told pt that he does not want to be stuck anymore.

## 2016-04-04 NOTE — ED Notes (Signed)
Bedside reporting done with Jenel Lucks, RN.

## 2016-04-05 ENCOUNTER — Inpatient Hospital Stay (HOSPITAL_COMMUNITY): Payer: BLUE CROSS/BLUE SHIELD

## 2016-04-05 ENCOUNTER — Encounter: Payer: Self-pay | Admitting: Radiation Oncology

## 2016-04-05 DIAGNOSIS — R9431 Abnormal electrocardiogram [ECG] [EKG]: Secondary | ICD-10-CM

## 2016-04-05 DIAGNOSIS — G893 Neoplasm related pain (acute) (chronic): Secondary | ICD-10-CM

## 2016-04-05 DIAGNOSIS — E43 Unspecified severe protein-calorie malnutrition: Secondary | ICD-10-CM | POA: Insufficient documentation

## 2016-04-05 DIAGNOSIS — R4182 Altered mental status, unspecified: Secondary | ICD-10-CM

## 2016-04-05 DIAGNOSIS — D696 Thrombocytopenia, unspecified: Secondary | ICD-10-CM

## 2016-04-05 DIAGNOSIS — G9341 Metabolic encephalopathy: Secondary | ICD-10-CM | POA: Insufficient documentation

## 2016-04-05 DIAGNOSIS — G9349 Other encephalopathy: Secondary | ICD-10-CM

## 2016-04-05 DIAGNOSIS — G9340 Encephalopathy, unspecified: Secondary | ICD-10-CM

## 2016-04-05 DIAGNOSIS — D649 Anemia, unspecified: Secondary | ICD-10-CM

## 2016-04-05 DIAGNOSIS — I2699 Other pulmonary embolism without acute cor pulmonale: Secondary | ICD-10-CM

## 2016-04-05 DIAGNOSIS — C7951 Secondary malignant neoplasm of bone: Secondary | ICD-10-CM

## 2016-04-05 DIAGNOSIS — C159 Malignant neoplasm of esophagus, unspecified: Secondary | ICD-10-CM

## 2016-04-05 DIAGNOSIS — I10 Essential (primary) hypertension: Secondary | ICD-10-CM

## 2016-04-05 LAB — CBC
HCT: 23.6 % — ABNORMAL LOW (ref 39.0–52.0)
Hemoglobin: 7.9 g/dL — ABNORMAL LOW (ref 13.0–17.0)
MCH: 29.6 pg (ref 26.0–34.0)
MCHC: 33.5 g/dL (ref 30.0–36.0)
MCV: 88.4 fL (ref 78.0–100.0)
PLATELETS: 22 10*3/uL — AB (ref 150–400)
RBC: 2.67 MIL/uL — AB (ref 4.22–5.81)
RDW: 14.7 % (ref 11.5–15.5)
WBC: 5.6 10*3/uL (ref 4.0–10.5)

## 2016-04-05 LAB — TSH
TSH: 1.304 u[IU]/mL (ref 0.350–4.500)
TSH: 0.952 u[IU]/mL (ref 0.350–4.500)

## 2016-04-05 LAB — COMPREHENSIVE METABOLIC PANEL
ALK PHOS: 140 U/L — AB (ref 38–126)
ALT: 34 U/L (ref 17–63)
AST: 48 U/L — AB (ref 15–41)
Albumin: 2.4 g/dL — ABNORMAL LOW (ref 3.5–5.0)
Anion gap: 7 (ref 5–15)
BILIRUBIN TOTAL: 0.7 mg/dL (ref 0.3–1.2)
BUN: 26 mg/dL — AB (ref 6–20)
CALCIUM: 11.2 mg/dL — AB (ref 8.9–10.3)
CO2: 27 mmol/L (ref 22–32)
CREATININE: 0.72 mg/dL (ref 0.61–1.24)
Chloride: 100 mmol/L — ABNORMAL LOW (ref 101–111)
Glucose, Bld: 133 mg/dL — ABNORMAL HIGH (ref 65–99)
Potassium: 3.8 mmol/L (ref 3.5–5.1)
Sodium: 134 mmol/L — ABNORMAL LOW (ref 135–145)
Total Protein: 5.3 g/dL — ABNORMAL LOW (ref 6.5–8.1)

## 2016-04-05 LAB — PHOSPHORUS: Phosphorus: 3 mg/dL (ref 2.5–4.6)

## 2016-04-05 LAB — TYPE AND SCREEN
ABO/RH(D): A POS
Antibody Screen: NEGATIVE

## 2016-04-05 LAB — LACTATE DEHYDROGENASE: LDH: 322 U/L — ABNORMAL HIGH (ref 98–192)

## 2016-04-05 LAB — PREALBUMIN: PREALBUMIN: 10.4 mg/dL — AB (ref 18–38)

## 2016-04-05 LAB — ABO/RH: ABO/RH(D): A POS

## 2016-04-05 LAB — MAGNESIUM: Magnesium: 1.8 mg/dL (ref 1.7–2.4)

## 2016-04-05 MED ORDER — POLYETHYLENE GLYCOL 3350 17 G PO PACK: 17.0000 g | PACK | Freq: Every day | ORAL | Status: DC | PRN

## 2016-04-05 MED ORDER — ZOLPIDEM TARTRATE 10 MG PO TABS
10.0000 mg | ORAL_TABLET | Freq: Every day | ORAL | Status: DC
  Administered 2016-04-05 (×2): 10 mg via ORAL
  Filled 2016-04-05 (×2): qty 1

## 2016-04-05 MED ORDER — ACETAMINOPHEN 650 MG RE SUPP: 650.0000 mg | Freq: Four times a day (QID) | RECTAL | Status: DC | PRN

## 2016-04-05 MED ORDER — POTASSIUM CHLORIDE CRYS ER 20 MEQ PO TBCR
40.0000 meq | EXTENDED_RELEASE_TABLET | Freq: Two times a day (BID) | ORAL | Status: AC
  Administered 2016-04-05 (×2): 40 meq via ORAL
  Filled 2016-04-05 (×2): qty 2

## 2016-04-05 MED ORDER — ACETAMINOPHEN 325 MG PO TABS: 650.0000 mg | ORAL_TABLET | Freq: Four times a day (QID) | ORAL | Status: DC | PRN

## 2016-04-05 MED ORDER — SODIUM CHLORIDE 0.9% FLUSH: 10.0000 mL | INTRAVENOUS | Status: DC | PRN

## 2016-04-05 MED ORDER — LORAZEPAM 2 MG/ML IJ SOLN
1.0000 mg | Freq: Once | INTRAMUSCULAR | Status: AC
  Administered 2016-04-05: 1 mg via INTRAVENOUS
  Filled 2016-04-05: qty 1

## 2016-04-05 MED ORDER — SODIUM CHLORIDE 0.9% FLUSH: 3.0000 mL | Freq: Two times a day (BID) | INTRAVENOUS | Status: DC

## 2016-04-05 MED ORDER — METOPROLOL TARTRATE 5 MG/5ML IV SOLN
5.0000 mg | Freq: Once | INTRAVENOUS | Status: AC
  Administered 2016-04-05: 5 mg via INTRAVENOUS
  Filled 2016-04-05: qty 5

## 2016-04-05 MED ORDER — ALTEPLASE 2 MG IJ SOLR
2.0000 mg | Freq: Once | INTRAMUSCULAR | Status: AC
  Administered 2016-04-05: 2 mg
  Filled 2016-04-05: qty 2

## 2016-04-05 MED ORDER — SENNA 8.6 MG PO TABS
1.0000 | ORAL_TABLET | Freq: Two times a day (BID) | ORAL | Status: DC
  Administered 2016-04-05 – 2016-04-06 (×3): 8.6 mg via ORAL
  Filled 2016-04-05 (×3): qty 1

## 2016-04-05 MED ORDER — MAGNESIUM SULFATE 2 GM/50ML IV SOLN
2.0000 g | Freq: Once | INTRAVENOUS | Status: AC
  Administered 2016-04-05: 2 g via INTRAVENOUS
  Filled 2016-04-05: qty 50

## 2016-04-05 MED ORDER — MAGIC MOUTHWASH
5.0000 mL | Freq: Three times a day (TID) | ORAL | Status: DC | PRN
  Filled 2016-04-05: qty 5

## 2016-04-05 MED ORDER — MORPHINE SULFATE (PF) 2 MG/ML IV SOLN
1.0000 mg | INTRAVENOUS | Status: DC | PRN
  Administered 2016-04-06: 2 mg via INTRAVENOUS
  Filled 2016-04-05: qty 1

## 2016-04-05 MED ORDER — LACTULOSE 10 GM/15ML PO SOLN
20.0000 g | Freq: Three times a day (TID) | ORAL | Status: DC | PRN
  Filled 2016-04-05: qty 30

## 2016-04-05 MED ORDER — DEXAMETHASONE 4 MG PO TABS
2.0000 mg | ORAL_TABLET | Freq: Two times a day (BID) | ORAL | Status: DC
  Administered 2016-04-05 – 2016-04-06 (×4): 2 mg via ORAL
  Filled 2016-04-05 (×4): qty 1

## 2016-04-05 MED ORDER — RIVAROXABAN 20 MG PO TABS
20.0000 mg | ORAL_TABLET | Freq: Every day | ORAL | Status: DC
  Filled 2016-04-05: qty 1

## 2016-04-05 MED ORDER — LORAZEPAM 2 MG/ML IJ SOLN
0.5000 mg | Freq: Once | INTRAMUSCULAR | Status: AC
  Administered 2016-04-05: 0.5 mg via INTRAVENOUS
  Filled 2016-04-05: qty 1

## 2016-04-05 MED ORDER — ADULT MULTIVITAMIN W/MINERALS CH
1.0000 | ORAL_TABLET | Freq: Every day | ORAL | Status: DC
  Administered 2016-04-06: 1 via ORAL
  Filled 2016-04-05: qty 1

## 2016-04-05 MED ORDER — SODIUM CHLORIDE 0.9 % IV BOLUS (SEPSIS)
1000.0000 mL | Freq: Once | INTRAVENOUS | Status: AC
  Administered 2016-04-05: 1000 mL via INTRAVENOUS

## 2016-04-05 MED ORDER — BISACODYL 10 MG RE SUPP: 10.0000 mg | Freq: Every day | RECTAL | Status: DC | PRN

## 2016-04-05 MED ORDER — MORPHINE SULFATE ER 15 MG PO TBCR
15.0000 mg | EXTENDED_RELEASE_TABLET | Freq: Three times a day (TID) | ORAL | Status: DC
  Administered 2016-04-05 – 2016-04-06 (×5): 15 mg via ORAL
  Filled 2016-04-05 (×5): qty 1

## 2016-04-05 MED ORDER — MORPHINE SULFATE (CONCENTRATE) 10 MG/0.5ML PO SOLN: 5.0000 mg | ORAL | Status: DC | PRN

## 2016-04-05 MED ORDER — SODIUM CHLORIDE 0.9 % IV SOLN: INTRAVENOUS | Status: AC

## 2016-04-05 MED ORDER — SODIUM CHLORIDE 0.9% FLUSH: 10.0000 mL | Freq: Two times a day (BID) | INTRAVENOUS | Status: DC

## 2016-04-05 MED ORDER — SODIUM CHLORIDE 0.9 % IV SOLN
INTRAVENOUS | Status: DC
  Administered 2016-04-05: 01:00:00 via INTRAVENOUS

## 2016-04-05 MED ORDER — SUCRALFATE 1 G PO TABS
1.0000 g | ORAL_TABLET | Freq: Four times a day (QID) | ORAL | Status: DC
  Administered 2016-04-05 – 2016-04-06 (×5): 1 g via ORAL
  Filled 2016-04-05 (×5): qty 1

## 2016-04-05 MED ORDER — METOPROLOL SUCCINATE ER 25 MG PO TB24
25.0000 mg | ORAL_TABLET | Freq: Every day | ORAL | Status: DC
  Administered 2016-04-05 – 2016-04-06 (×2): 25 mg via ORAL
  Filled 2016-04-05 (×2): qty 1

## 2016-04-05 MED ORDER — POLYETHYLENE GLYCOL 3350 17 G PO PACK
17.0000 g | PACK | Freq: Two times a day (BID) | ORAL | Status: DC
  Administered 2016-04-05 – 2016-04-06 (×3): 17 g via ORAL
  Filled 2016-04-05 (×3): qty 1

## 2016-04-05 MED ORDER — CYCLOBENZAPRINE HCL 5 MG PO TABS: 5.0000 mg | ORAL_TABLET | Freq: Three times a day (TID) | ORAL | Status: DC | PRN

## 2016-04-05 MED ORDER — IOPAMIDOL (ISOVUE-300) INJECTION 61%
INTRAVENOUS | Status: AC
  Administered 2016-04-05: 75 mL
  Filled 2016-04-05: qty 75

## 2016-04-05 NOTE — Plan of Care (Signed)
Problem: Safety: Goal: Ability to remain free from injury will improve Outcome: Completed/Met Date Met: 04/05/16 Bed alarm set when patient is alone in room

## 2016-04-05 NOTE — Progress Notes (Signed)
Initial Nutrition Assessment  DOCUMENTATION CODES:   Severe malnutrition in context of acute illness/injury  INTERVENTION:  - Will order daily multivitamin with minerals.  - RD will follow-up 2/2 to monitor POC and further nutrition-related needs/interventions (such as ordering Boost, which pt was drinking PTA).  NUTRITION DIAGNOSIS:   Increased nutrient needs related to catabolic illness, cancer and cancer related treatments as evidenced by estimated needs.  GOAL:   Patient will meet greater than or equal to 90% of their needs  MONITOR:   PO intake, Supplement acceptance, Weight trends, Labs, I & O's  REASON FOR ASSESSMENT:   Malnutrition Screening Tool, Consult Assessment of nutrition requirement/status  ASSESSMENT:   61 y.o. male with medical history significant of esophageal cancer with lung and bone metastasis ,  HTN, HLD, history of PE, sleep apnea. Presented with increased confusion and fatigue he's been having trouble with his word finding. Patient had been having increased dysphasia he has chronic dysphagia with been getting worse he was seen by this oncology yesterday and is suspect esophagitis secondary to radiation therapy he has hardly drank any water or food secondary to discomfort. Was seen in the office by oncology and given calcium level15 sent to emergency department. Reports severe constipation. NO abdominal pain  Pt seen for MST and consult. BMI indicates overweight status. No intakes documented since admission. Breakfast tray was untouched at time of rounds this AM and remained on bedside table, still untouched, at time of RD visit a few minutes ago. Daughter reports pt did have a few bites of applesauce to take medications this AM, but pt does not recall this. He denies any belly pain or nausea at this time. He states that his throat is sore and is a little worse when he talks for prolonged periods.   Reviewed all notes in the chart as well as most recent note  by [facility] RD on 03/26/16. From these, it is understood that pt was experiencing constipation PTA. RD note states pt had been experiencing bloating, fatigue, and increased weakness as of 1/22 and that he was only able to consume very small amounts of very soft foods at that time. He was experiencing progressive difficulty with swallowing liquids and solids. His daughter reported that on 1/30 he consumed 1 bottle of Boost and 1 popsicle which is much less than he had been consuming even 2 weeks ago.   Daughter, son, and ex-wife were at bedside at time of visit and were notably sad/worried about pt's current condition. They deny any nutrition-related questions or concerns at this time. During rounds MD reported plan for head CT with contrast; family reports this has been completed and they are anxiously awaiting results and plan moving forward. Informed them that RD will continue to monitor and follow closely.  Physical assessment deferred at this time. No notable muscle or fat wasting but unable to confirm at this time. Pt meets criteria for severe malnutrition based on <50% intakes for >/= 5 days and 16 lb weight loss (7.4% body weight) in the past 1 month. During rounds MD had reported 4L fluid deficit at time of admission.   Medications reviewed; PRN Dulcolax, PRN lactulose, 2 g IV Mg sulfate x1 dose today, PRN IV Zofran, 17 g Miralax BID, 40 mEq oral KCl BID, 1 tablet Senokot BID, 1 g Carafate QID. Labs reviewed; Na: 134 mmol/L, Cl: 100 mmol/L, BUN: 26 mg/dL, Ca: 11.2 mg/dL, Alk Phos/AST elevated.     Diet Order:  DIET SOFT Room service  appropriate? Yes; Fluid consistency: Thin  Skin:  Reviewed, no issues  Last BM:  PTA/unknown  Height:   Ht Readings from Last 1 Encounters:  04/05/16 '5\' 10"'$  (1.778 m)    Weight:   Wt Readings from Last 1 Encounters:  04/05/16 199 lb 11.8 oz (90.6 kg)    Ideal Body Weight:  75.45 kg  BMI:  Body mass index is 28.66 kg/m.  Estimated Nutritional Needs:    Kcal:  2625-2900 (29-32 kcal/kg)  Protein:  127-145 grams (1.4-1.6 grams/kg)  Fluid:  >/= 2.5 L/day  EDUCATION NEEDS:   No education needs identified at this time    Jarome Matin, MS, RD, LDN, CNSC Inpatient Clinical Dietitian Pager # [phone] After hours/weekend pager # [phone]

## 2016-04-05 NOTE — Progress Notes (Signed)
Attempted to complete Orthostatic vital signs. Patient HR increased 130-140 while sitting on the side of the bed. Will follow up.

## 2016-04-05 NOTE — Progress Notes (Signed)
°  Radiation Oncology         [phone] ________________________________  Name: Walter Craig MRN: [id number]  Date: 04/05/2016  DOB: 09-09-1955  End of Treatment Note  Diagnosis:   Esophageal cancer, Stage IV    Indication for treatment:  Curative       Radiation treatment dates:   03/12/16 - 04/02/16  Site/dose:   Spine Mets treated to 37.5 Gy in 15 fractions.  Beams/energy:   3D  //  15X  Narrative: The patient tolerated radiation treatment relatively well. The patient's pain improved over the course of treatment, though he remained on pain medication. On 03/28/16 the patient reported to the ED for constipation and was given 2 enemas for relief.  Plan: The patient has completed radiation treatment. The patient as encouraged to continue using Miralax as needed to manage constipation. The patient will return to radiation oncology clinic for routine followup in one month. I advised them to call or return sooner if they have any questions or concerns related to their recovery or treatment.  ------------------------------------------------  Jodelle Gross, MD, PhD  This document serves as a record of services personally performed by Kyung Rudd, MD. It was created on his behalf by Maryla Morrow, a trained medical scribe. The creation of this record is based on the scribe's personal observations and the provider's statements to them. This document has been checked and approved by the attending provider.

## 2016-04-05 NOTE — Progress Notes (Signed)
OT Cancellation Note  Patient Details Name: Walter Craig MRN: [id number] DOB: 12/26/1955   Cancelled Treatment:    Reason Eval/Treat Not Completed: Medical issues which prohibited therapy;Patient not medically ready. Pt agitated this am. RN gave ativan. Will hold OT for now and check back tomorrow  Britt Bottom 04/05/2016, 11:27 AM

## 2016-04-05 NOTE — Progress Notes (Signed)
NP on call notified that heart rate up to 160's with movement and 130's at rest. One time dose of IV Metoprolol given per orders. Patient became increasingly confused and agitated overnight. Pulling at tele, oxygen tubing, and getting out bed unassisted. Patient eventually pulled port out. Placed mitts on patient and notified family of change in condition. One time dose of IV ativan received from NP on call.  Will continue to monitor patient.

## 2016-04-05 NOTE — Evaluation (Signed)
Physical Therapy Evaluation Patient Details Name: TYLEE BIERS MRN: [id number] DOB: Sep 01, 1955 Today's Date: 04/05/2016   History of Present Illness  61 yo male admitted with AMS. Hx of esophageal cancer with lung and bone mets, PE, HTN, anxiety.   Clinical Impression  On eval, pt required Min assist for mobility. He stood up at EOB and was able to take some side steps along bedside with RW. Activity limited due to HR (120s at rest, 140s with activity). Family present during session and report pt remains cognitively impaired. Pt participated well and followed commands well. Will follow and mobilize as tolerated. Recommend HHPT and 24 hour supervision/assist. If 24 hour care is not available, pt will require SNF placement.     Follow Up Recommendations Home health PT;Supervision/Assistance - 24 hour    Equipment Recommendations  None recommended by PT    Recommendations for Other Services OT consult     Precautions / Restrictions Precautions Precautions: Fall Precaution Comments: monitor vitals (HR, O2) Restrictions Weight Bearing Restrictions: No      Mobility  Bed Mobility Overal bed mobility: Needs Assistance Bed Mobility: Supine to Sit;Sit to Supine     Supine to sit: Min assist Sit to supine: Min assist   General bed mobility comments: Assist for trunk and LEs. Cues required.   Transfers Overall transfer level: Needs assistance Equipment used: Rolling walker (2 wheeled) Transfers: Sit to/from Stand Sit to Stand: Min assist         General transfer comment: Assist to rise, stabilize, control descent. VCs safety,hand placement.   Ambulation/Gait Ambulation/Gait assistance: Min assist   Assistive device: Rolling walker (2 wheeled)       General Gait Details: side steps along bedside with RW. Remained on [place] O2, HR up to 140s.   Stairs            Wheelchair Mobility    Modified Rankin (Stroke Patients Only)       Balance Overall balance assessment:  Needs assistance           Standing balance-Leahy Scale: Poor                               Pertinent Vitals/Pain Pain Assessment: Faces Faces Pain Scale: Hurts little more Pain Location: "not much" when asked Pain Intervention(s): Monitored during session;Repositioned    Home Living Family/patient expects to be discharged to:: Private residence Living Arrangements: Other relatives Available Help at Discharge: Family Type of Home: House Home Access: Stairs to enter Entrance Stairs-Rails: None Entrance Stairs-Number of Steps: 3 Home Layout: One level Home Equipment: Environmental consultant - 2 wheels;Shower seat      Prior Function Level of Independence: Independent               Hand Dominance        Extremity/Trunk Assessment   Upper Extremity Assessment Upper Extremity Assessment: Generalized weakness    Lower Extremity Assessment Lower Extremity Assessment: Generalized weakness    Cervical / Trunk Assessment Cervical / Trunk Assessment: Normal  Communication   Communication: No difficulties  Cognition Arousal/Alertness: Awake/alert Behavior During Therapy: Restless Overall Cognitive Status: Impaired/Different from baseline Area of Impairment: Orientation;Safety/judgement;Problem solving Orientation Level: Person;Time;Situation       Safety/Judgement: Decreased awareness of safety;Decreased awareness of deficits   Problem Solving: Requires tactile cues;Requires verbal cues      General Comments      Exercises     Assessment/Plan  PT Assessment Patient needs continued PT services  PT Problem List Decreased strength;Decreased mobility;Decreased activity tolerance;Decreased balance;Decreased knowledge of use of DME;Cardiopulmonary status limiting activity;Decreased cognition          PT Treatment Interventions DME instruction;Gait training;Therapeutic exercise;Therapeutic activities;Patient/family education;Functional mobility  training;Balance training    PT Goals (Current goals can be found in the Care Plan section)  Acute Rehab PT Goals Patient Stated Goal: none stated PT Goal Formulation: Patient unable to participate in goal setting Time For Goal Achievement: 04/19/16 Potential to Achieve Goals: Good    Frequency Min 3X/week   Barriers to discharge        Co-evaluation               End of Session Equipment Utilized During Treatment: Oxygen Activity Tolerance: Patient tolerated treatment well Patient left: in bed;with call bell/phone within reach;with family/visitor present;with bed alarm set           Time: 1331-1340 PT Time Calculation (min) (ACUTE ONLY): 9 min   Charges:   PT Evaluation $PT Eval Low Complexity: 1 Procedure     PT G Codes:        Weston Anna, MPT Pager: [phone]

## 2016-04-05 NOTE — Consult Note (Signed)
Phone conversation with POA, daughter Janett Billow.  Palliative concepts discussed.  Patient history and wishes are clear to family.  Goal is for comfort and symptom management for EOL care.  Possible discharge options discussed.    Full face to face family meeting scheduled for 2/2 at 11am.    Case discussed with Dr. Hilma Favors.  Orders received and placed.  Full not will follow after family discussion tomorrow.  Kizzie Fantasia, MSN, RN-BC, [facility] Palliative Clinical Specialist

## 2016-04-05 NOTE — Evaluation (Signed)
Clinical/Bedside Swallow Evaluation Patient Details  Name: Walter Craig MRN: [id number] Date of Birth: 02-20-1956  Today's Date: 04/05/2016 Time: SLP Start Time (ACUTE ONLY): 1310 SLP Stop Time (ACUTE ONLY): 1325 SLP Time Calculation (min) (ACUTE ONLY): 15 min  Past Medical History:  Past Medical History:  Diagnosis Date  . Anxiety   . Bone cancer ([place])    t=11 lytic lesion  . Cancer ([place]) 02/2016   low esophagus cancer, mets   . Depression   . Depression with anxiety   . 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 HLD (hyperlipidemia)   . Hypertension   . Pulmonary embolism ([place])   . Sleep apnea    Past Surgical History:  Past Surgical History:  Procedure Laterality Date  . CHOLECYSTECTOMY    . IR GENERIC HISTORICAL  02/24/2016   IR US GUIDE VASC ACCESS RIGHT 02/24/2016 Greggory Keen, MD [facility]  . IR GENERIC HISTORICAL  02/24/2016   IR FLUORO GUIDE PORT INSERTION RIGHT 02/24/2016 Greggory Keen, MD [facility]  . left knee meniscus repare      HPI:  61 year old male admitted 04/04/16 due to confusion and AMS. 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 Chronic dysphagia s/p radiation and chemo. HTN, HLD, PE, OSA.    Assessment / Plan / Recommendation Clinical Impression  Pt presents with generalized oral motor weakness, resulting in dysarthric speech. No overt s/s aspiration observed or reported on any consistency, however, pt reports poor appetite and subsequent poor po intake. Pt has history of esophageal CA, s/p chemo and radiation, which significantly increases the risk of esophagitis and mucositis.  Given prognosis, recommend continuing po intake per pt preference. No further ST intervention is recommended at this time.    Aspiration Risk  Mild aspiration risk    Diet Recommendation Thin liquid;Dysphagia 3 (Mech soft)   Liquid Administration via: Cup;Straw Medication Administration: Whole meds with  liquid Supervision: Patient able to self feed Compensations: Minimize environmental distractions;Slow rate;Small sips/bites Postural Changes: Seated upright at 90 degrees;Remain upright for at least 30 minutes after po intake    Other  Recommendations Oral Care Recommendations: Oral care before and after PO   Follow up Recommendations   n/a     Frequency and Duration   n/a         Prognosis Prognosis for Safe Diet Advancement: Fair      Swallow Study   General Date of Onset: 04/04/16 HPI: 61 year old male admitted 04/04/16 due to confusion and AMS. 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 Chronic dysphagia s/p radiation and chemo. HTN, HLD, PE, OSA.  Type of Study: Bedside Swallow Evaluation Previous Swallow Assessment: none Diet Prior to this Study: Dysphagia 3 (soft);Thin liquids Temperature Spikes Noted: No Respiratory Status: Nasal cannula History of Recent Intubation: No Behavior/Cognition: Alert;Cooperative;Pleasant mood;Confused Oral Cavity Assessment: Within Functional Limits Oral Care Completed by SLP: No Oral Cavity - Dentition: Missing dentition Vision: Functional for self-feeding Self-Feeding Abilities: Able to feed self Patient Positioning: Upright in bed Baseline Vocal Quality: Low vocal intensity Volitional Cough: Weak Volitional Swallow: Able to elicit    Oral/Motor/Sensory Function Overall Oral Motor/Sensory Function: Moderate impairment Facial Strength:  (generally reduced) Lingual Strength: Reduced   Ice Chips Ice chips: Not tested   Thin Liquid Thin Liquid: Within functional limits Presentation: Straw;Cup    Nectar Thick Nectar Thick Liquid: Not tested   Honey Thick Honey Thick  Liquid: Not tested   Puree Puree: Not tested   Solid   GO   Solid: Within functional limits Presentation: [facility], CCC-SLP [id number]  Shonna Chock 04/05/2016,3:35 PM

## 2016-04-05 NOTE — Plan of Care (Signed)
Problem: Education: Goal: Knowledge of [facility] Education information/materials will improve Outcome: Completed/Met Date Met: 04/05/16 Discussed continued plan of care with patient son and daughter

## 2016-04-05 NOTE — Progress Notes (Signed)
Walter Craig   DOB:15-Jul-1963   WU#:[id number]   GNF#:[id number]  Medical oncology follow-up note  Subjective: Patient is known to me, under my care for his metastatic esophageal cancer. He recently finished palliative radiation. He was admitted for altered mental status and hypercalcemia. He still intermittently confused, very fatigued, has not heat or drink much at all. I met his daughter and his son and his ex-wife at her bedside today. He has deteriorated quickly in the past few days, bedbound now.   Objective:  Vitals:   04/05/16 1819 04/05/16 2116  BP: 129/85 (!) 155/87  Pulse: (!) 120 (!) 128  Resp: 18 (!) 24  Temp: 98 F (36.7 C) 99.3 F (37.4 C)    Body mass index is 28.66 kg/m.  Intake/Output Summary (Last 24 hours) at 04/05/16 2205 Last data filed at 04/05/16 1820  Gross per 24 hour  Intake              342 ml  Output             1750 ml  Net            -1408 ml    Lethargic, able to answer some simple questions, slurry speech.  Sclerae unicteric  Lungs clear -- no rales or rhonchi  Heart regular rate and rhythm  Abdomen benign   CBG (last 3)  No results for input(s): GLUCAP in the last 72 hours.   Labs:  Lab Results  Component Value Date   WBC 5.6 04/05/2016   HGB 7.9 (L) 04/05/2016   HCT 23.6 (L) 04/05/2016   MCV 88.4 04/05/2016   PLT 22 (LL) 04/05/2016   NEUTROABS 4.9 04/04/2016   CMP Latest Ref Rng & Units 04/05/2016 04/04/2016 04/04/2016  Glucose 65 - 99 mg/dL 133(H) 108(H) 125  BUN 6 - 20 mg/dL 26(H) 33(H) 30.6(H)  Creatinine 0.61 - 1.24 mg/dL 0.72 0.99 0.9  Sodium 135 - 145 mmol/L 134(L) 134(L) 136  Potassium 3.5 - 5.1 mmol/L 3.8 3.7 4.0  Chloride 101 - 111 mmol/L 100(L) 97(L) -  CO2 22 - 32 mmol/L '27 28 28  '$ Calcium 8.9 - 10.3 mg/dL 11.2(H) 13.5(HH) 14.0(HH)  Total Protein 6.5 - 8.1 g/dL 5.3(L) 5.6(L) 5.8(L)  Total Bilirubin 0.3 - 1.2 mg/dL 0.7 1.1 1.00  Alkaline Phos 38 - 126 U/L 140(H) 145(H) 190(H)  AST 15 - 41 U/L 48(H) 45(H) 45(H)  ALT 17 - 63  U/L 34 35 37     Urine Studies No results for input(s): UHGB, CRYS in the last 72 hours.  Invalid input(s): UACOL, UAPR, USPG, UPH, UTP, UGL, UKET, UBIL, UNIT, UROB, ULEU, UEPI, UWBC, Duwayne Heck [place], [place]  Basic Metabolic Panel:  Recent Labs Lab 04/04/16 1446 04/04/16 1750 04/05/16 0654  NA 136 134* 134*  K 4.0 3.7 3.8  CL  --  97* 100*  CO2 '28 28 27  '$ GLUCOSE 125 108* 133*  BUN 30.6* 33* 26*  CREATININE 0.9 0.99 0.72  CALCIUM 14.0* 13.5* 11.2*  MG  --   --  1.8  PHOS  --   --  3.0   GFR Estimated Creatinine Clearance: 112.5 mL/min (by C-G formula based on SCr of 0.72 mg/dL). Liver Function Tests:  Recent Labs Lab 04/04/16 1446 04/04/16 1750 04/05/16 0654  AST 45* 45* 48*  ALT 37 35 34  ALKPHOS 190* 145* 140*  BILITOT 1.00 1.1 0.7  PROT 5.8* 5.6* 5.3*  ALBUMIN 2.6* 2.5* 2.4*   No results for input(s):  LIPASE, AMYLASE in the last 168 hours. No results for input(s): AMMONIA in the last 168 hours. Coagulation profile No results for input(s): INR, PROTIME in the last 168 hours.  CBC:  Recent Labs Lab 04/04/16 1445 04/04/16 1750 04/05/16 0654  WBC 5.5 6.0 5.6  NEUTROABS 4.5 4.9  --   HGB 10.3* 9.5* 7.9*  HCT 30.5* 27.8* 23.6*  MCV 90.7 90.3 88.4  PLT 48* PLATELET CLUMPS NOTED ON SMEAR, COUNT APPEARS DECREASED 22*   Cardiac Enzymes: No results for input(s): CKTOTAL, CKMB, CKMBINDEX, TROPONINI in the last 168 hours. BNP: Invalid input(s): POCBNP CBG: No results for input(s): GLUCAP in the last 168 hours. D-Dimer No results for input(s): DDIMER in the last 72 hours. Hgb A1c No results for input(s): HGBA1C in the last 72 hours. Lipid Profile No results for input(s): CHOL, HDL, LDLCALC, TRIG, CHOLHDL, LDLDIRECT in the last 72 hours. Thyroid function studies  Recent Labs  04/05/16 0654  TSH 0.952   Anemia work up No results for input(s): VITAMINB12, FOLATE, FERRITIN, TIBC, IRON, RETICCTPCT in the last 72 hours. Microbiology No results  found for this or any previous visit (from the past 240 hour(s)).    Studies:  Dg Chest 2 View  Result Date: 04/04/2016 CLINICAL DATA:  Acute onset of difficulty swallowing. Confusion and dehydration. Acute onset of shortness of breath and generalized weakness. Initial encounter. EXAM: CHEST  2 VIEW COMPARISON:  Chest radiograph performed 01/05/2016, and CTA of the chest performed 03/02/2016 FINDINGS: The patient's left-sided lung and rib masses are again noted. A smaller right-sided lung and rib mass is also partially characterized. No superimposed focal airspace consolidation is seen. No pleural effusion or pneumothorax identified. The heart is normal in size. A right-sided chest port is noted ending about the mid to distal SVC. Known metastatic disease along the thoracic spine is not well characterized. Clips are noted within the right upper quadrant, reflecting prior cholecystectomy. IMPRESSION: 1. Bilateral lung and rib masses again noted, larger on the left. 2. Known metastatic disease along the thoracic spine is not well characterized on radiograph. Electronically Signed   By: Roanna Raider M.D.   On: 04/04/2016 17:32   Ct Head W & Wo Contrast  Result Date: 04/05/2016 CLINICAL DATA:  Esophageal cancer with bone Mets. Fatigue and confusion. EXAM: CT HEAD WITHOUT AND WITH CONTRAST TECHNIQUE: Contiguous axial images were obtained from the base of the skull through the vertex without and with intravenous contrast CONTRAST:  32mL ISOVUE-300 IOPAMIDOL (ISOVUE-300) INJECTION 61% COMPARISON:  None. FINDINGS: Brain: No evidence of acute infarction, hemorrhage, hydrocephalus, extra-axial collection or mass lesion/mass effect. Patchy low-density in the cerebral white matter, likely chronic microvascular disease given vascular risk factors. 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 Major vessels are patent. Skull: Multiple lytic lesions consistent with  metastatic disease, also seen on recent bone scan. Notable lesion in the right frontal and right parietal bone with erosion of the inner table, both measuring up to 1 cm. Notable lesion in the upper right occipital condyles near but not and involving the hypoglossal foramen. There are multiple small calvarial lucencies which could be metastasis or arachnoid granulation/venous lakes. Sinuses/Orbits: No acute finding IMPRESSION: 1. No acute finding or evidence of parenchymal metastasis. 2. 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 parietal bone metastases erode the inner table. 3. 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 Electronically Signed   By: Marnee Spring M.D.   On: 04/05/2016 11:26  Assessment: 61 y.o. with recently diagnosed metastatic esophageal cancer, status post 1 cycle chemotherapy and palliative radiation. His overall condition has quickly deteriorated in the past week.  1. Acute encephalopathy secondary to hypercalcemia and malignancy 2. Worsening anemia, new onset thrombocytopenia, possible DIC related to malignancy 3. Hypercalcemia secondary to malignancy 4. Esophageal cancer with diffuse bone metastasis 5. Severe constipation secondary to narcotics and hypocalcemia 6. History of PE, on Xarelto  7. Severe calorie and protein malnutrition  Plan:  -Patient's overall condition has dramatically deteriorated in the past week, likely secondary to cancer progression. -Unfortunately is not a candidate for chemotherapy due to his extremely poor performance status -I anticipate his life expectancy would be likely a few days to a few weeks -I recommend hospice and comfort care. He is probably eligible for inpatient hospice -Patient previously has clearly indicated his palliative goal of care, and DNR/DNI -I spoke with his daughter and son, they are in agreement with comfort care -palliative care consult has been called, he will likely transition to  hospice  -Hold Xarelto due to his severe thrombocytopenia  -continue supportive care.  -I will follow up as needed.    Truitt Merle, MD 04/05/2016

## 2016-04-05 NOTE — Progress Notes (Signed)
PT Cancellation Note  Patient Details Name: CHRISTAIN MOURE MRN: [id number] DOB: 1955-10-31   Cancelled Treatment:    Reason Eval/Treat Not Completed: Patient not medically ready. Pt agitated this am. RN gave ativan. Will hold PT for now and check back another time.    Weston Anna, MPT Pager: [phone]

## 2016-04-05 NOTE — Progress Notes (Signed)
Enema was completed, no BM. Will make provider aware.

## 2016-04-05 NOTE — Progress Notes (Signed)
Chaplain consulted - pt's family has questions about power of attorney.    Pt has [place] on file.   Pt does not have Durable Power of [employer] features [employer] [place].    Pt's children at bedside.  As Pt is currently not competent to make decisions, Daughter communicates they are looking for guidance around how to navigate POA / executor.    Consulted with Jeanella Craze, Director Spiritual Care, recommended that daughter contact [place] to inquire whether there are emergency provisions in place for families who are in their situation.  Daughter reports pt orientation varies.  If there is a moment where pt is oriented and capable of executing, documents, we may be able to help this family have documentation in place.    Please page if family / care team feels pt is competent to execute documents.     Family is eager to speak with Palliative Care Team.  Part of this conversation may be whether there are any Palliative Measures that could afford the patient moments of clarity to execute documents.      Jerene Pitch [place]

## 2016-04-05 NOTE — Progress Notes (Signed)
[facility] PROGRESS NOTE    Progress Note  Walter Craig  [id number] DOB: 05-Oct-1955 DOA: 04/04/2016 PCP: [name], MD     Brief Narrative:   Walter Craig is an 61 y.o. 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 He was seen by the oncologist on the day of admission and was found to have a calcium of 15  Assessment/Plan:   Acute encephalopathy due to Hypercalcemia of malignancy: He was started on aggressive IV fluid hydration his calcium today is 11 he was given calcitonin and Zometa. He continues to be significantly dehydrated is hyponatremic and hypochloremic, we'll give him an additional liter of normal saline. Continue to monitor strict I's and O's. Daily standing weights. As per family members is not his baseline and seems to be increasingly worsening confusion, we'll get a CT scan of the head with and without contrast to rule out metastatic disease.  Anemia due to chemotherapy: On admission his hemoglobin was 9.5 question if he was hemoconcentrated at this time, today 7.9. Will continue recheck in the morning. Family denies any signs of hematemesis melena or bright red blood per rectum.  Esophageal cancer, stage IV [facility]): Further management per oncology as an outpatient.    Thrombocytopenia: Previous platelet count on 03/29/2016 was 105, today is 22. We'll avoid heparin relates. Start SCDs. Awaiting hematology oncology recommendations on whether to continue Xarelto.  Cancer related pain/  Bone metastasis ([place]) Cont narcotics for pain.  Essential hypertension Cont current meds.  Hx of PE (pulmonary thromboembolism) ([place]) Cont xarelto.  Prolonged QT interval Due to hypercalcemia, magnesium 1.8 potassium 3.8 which I keep magnesium above 2 potassium above 4. Check a total EKG at noon. Avoid any prolonged QT medications.  Sinus tachycardia: Likely due to dehydration we'll give  him an additional liter of normal saline continue to monitor on telemetry.  DVT prophylaxis: lovenox Family Communication:none Disposition Plan/Barrier to D/C: home in 5 days when mental status improved. Code Status:     Code Status Orders        Start     Ordered   04/05/16 0038  Limited resuscitation (code)  Continuous    Question Answer Comment  In the event of cardiac or respiratory ARREST: Initiate Code Blue, Call Rapid Response No   In the event of cardiac or respiratory ARREST: Perform CPR No   In the event of cardiac or respiratory ARREST: Perform Intubation/Mechanical Ventilation No   In the event of cardiac or respiratory ARREST: Use NIPPV/BiPAp only if indicated Yes   In the event of cardiac or respiratory ARREST: Administer ACLS medications if indicated Yes   In the event of cardiac or respiratory ARREST: Perform Defibrillation or Cardioversion if indicated Yes      04/05/16 0038    Code Status History    Date Active Date Inactive Code Status Order ID Comments User Context   03/02/2016  9:14 PM 03/06/2016  5:24 PM Full Code [id number]  Ivor Costa, MD ED    Advance Directive Documentation   Flowsheet Row Most Recent Value  Type of Advance Directive  Healthcare Power of Attorney, Living will  Pre-existing out of facility DNR order (yellow form or pink MOST form)  No data  "MOST" Form in Place?  No data        IV Access:    Peripheral IV   Procedures and diagnostic studies:   Dg Chest 2 View  Result Date:  04/04/2016 CLINICAL DATA:  Acute onset of difficulty swallowing. Confusion and dehydration. Acute onset of shortness of breath and generalized weakness. Initial encounter. EXAM: CHEST  2 VIEW COMPARISON:  Chest radiograph performed 01/05/2016, and CTA of the chest performed 03/02/2016 FINDINGS: The patient's left-sided lung and rib masses are again noted. A smaller right-sided lung and rib mass is also partially characterized. No superimposed focal airspace  consolidation is seen. No pleural effusion or pneumothorax identified. The heart is normal in size. A right-sided chest port is noted ending about the mid to distal SVC. Known metastatic disease along the thoracic spine is not well characterized. Clips are noted within the right upper quadrant, reflecting prior cholecystectomy. IMPRESSION: 1. Bilateral lung and rib masses again noted, larger on the left. 2. Known metastatic disease along the thoracic spine is not well characterized on radiograph. Electronically Signed   By: Garald Balding M.D.   On: 04/04/2016 17:32     Medical Consultants:    None.  Anti-Infectives:   None  Subjective:    [place] confused.  Objective:    Vitals:   04/04/16 2148 04/04/16 2352 04/05/16 0022 04/05/16 0412  BP: 132/89 125/88 115/86 135/88  Pulse: (!) 123 (!) 124 (!) 141 (!) 132  Resp: 17 20  18   Temp:   97.7 F (36.5 C) 98.3 F (36.8 C)  TempSrc:   Oral Oral  SpO2: 97% 91% 96% 90%  Weight:   90.6 kg (199 lb 11.8 oz)   Height:   5\' 10"  (1.778 m)     Intake/Output Summary (Last 24 hours) at 04/05/16 0801 Last data filed at 04/05/16 0324  Gross per 24 hour  Intake             4000 ml  Output              250 ml  Net             3750 ml   Filed Weights   04/04/16 1637 04/05/16 0022  Weight: 88.5 kg (195 lb) 90.6 kg (199 lb 11.8 oz)    Exam: General exam: In no acute distress. Respiratory system: Good air movement and clear to auscultation. Cardiovascular system: S1 & S2 heard, RRR. No JVD. Gastrointestinal system: Abdomen is nondistended, soft and nontender.  Central nervous system: Alert and oriented. No focal neurological deficits. Extremities: No pedal edema. Skin: No rashes, lesions or ulcers Psychiatry: Judgement and insight appear normal. Mood & affect appropriate.    Data Reviewed:    Labs: Basic Metabolic Panel:  Recent Labs Lab 03/29/16 1314 04/04/16 1446 04/04/16 1750 04/05/16 0654  NA 133* 136 134* 134*    K 4.8 4.0 3.7 3.8  CL  --   --  97* 100*  CO2 28 28 28 27   GLUCOSE 92 125 108* 133*  BUN 40.1* 30.6* 33* 26*  CREATININE 0.8 0.9 0.99 0.72  CALCIUM 11.7* 14.0* 13.5* 11.2*  MG  --   --   --  1.8  PHOS  --   --   --  3.0   GFR Estimated Creatinine Clearance: 112.5 mL/min (by C-G formula based on SCr of 0.72 mg/dL). Liver Function Tests:  Recent Labs Lab 03/29/16 1314 04/04/16 1446 04/04/16 1750 04/05/16 0654  AST 52* 45* 45* 48*  ALT 61* 37 35 34  ALKPHOS 162* 190* 145* 140*  BILITOT 1.02 1.00 1.1 0.7  PROT 6.0* 5.8* 5.6* 5.3*  ALBUMIN 2.8* 2.6* 2.5* 2.4*   No results for  input(s): LIPASE, AMYLASE in the last 168 hours. No results for input(s): AMMONIA in the last 168 hours. Coagulation profile No results for input(s): INR, PROTIME in the last 168 hours.  CBC:  Recent Labs Lab 03/29/16 1313 04/04/16 1445 04/04/16 1750  WBC 7.6 5.5 6.0  NEUTROABS 6.6* 4.5 4.9  HGB 12.3* 10.3* 9.5*  HCT 36.0* 30.5* 27.8*  MCV 91.0 90.7 90.3  PLT 105* 48* PLATELET CLUMPS NOTED ON SMEAR, COUNT APPEARS DECREASED   Cardiac Enzymes: No results for input(s): CKTOTAL, CKMB, CKMBINDEX, TROPONINI in the last 168 hours. BNP (last 3 results) No results for input(s): PROBNP in the last 8760 hours. CBG: No results for input(s): GLUCAP in the last 168 hours. D-Dimer: No results for input(s): DDIMER in the last 72 hours. Hgb A1c: No results for input(s): HGBA1C in the last 72 hours. Lipid Profile: No results for input(s): CHOL, HDL, LDLCALC, TRIG, CHOLHDL, LDLDIRECT in the last 72 hours. Thyroid function studies:  Recent Labs  04/05/16 0654  TSH 0.952   Anemia work up: No results for input(s): VITAMINB12, FOLATE, FERRITIN, TIBC, IRON, RETICCTPCT in the last 72 hours. Sepsis Labs:  Recent Labs Lab 03/29/16 1313 04/04/16 1445 04/04/16 1750  WBC 7.6 5.5 6.0   Microbiology No results found for this or any previous visit (from the past 240 hour(s)).   Medications:   .  calcitonin  354 Units Subcutaneous BID  . dexamethasone  2 mg Oral BID  . metoprolol succinate  25 mg Oral Daily  . morphine  15 mg Oral Q8H  . rivaroxaban  20 mg Oral Daily  . senna  1 tablet Oral BID  . sodium chloride flush  10-40 mL Intracatheter Q12H  . sodium chloride flush  3 mL Intravenous Q12H  . sucralfate  1 g Oral QID  . zolpidem  10 mg Oral QHS   Continuous Infusions: . sodium chloride 150 mL/hr at 04/05/16 0105    Time spent: 35 min   LOS: 1 day   Charlynne Cousins  [facility] Pager [phone]  *Please refer to [email or web address], password [email or web address] to get updated schedule on who will round on this patient, as hospitalists switch teams weekly. If 7PM-7AM, please contact night-coverage at [email or web address], password [email or web address] for any overnight needs.  04/05/2016, 8:01 AM

## 2016-04-06 ENCOUNTER — Inpatient Hospital Stay (HOSPITAL_COMMUNITY): Payer: BLUE CROSS/BLUE SHIELD

## 2016-04-06 DIAGNOSIS — E43 Unspecified severe protein-calorie malnutrition: Secondary | ICD-10-CM

## 2016-04-06 DIAGNOSIS — R000 Tachycardia, unspecified: Secondary | ICD-10-CM

## 2016-04-06 LAB — CBC
HCT: 20.4 % — ABNORMAL LOW (ref 39.0–52.0)
Hemoglobin: 7 g/dL — ABNORMAL LOW (ref 13.0–17.0)
MCH: 31.3 pg (ref 26.0–34.0)
MCHC: 34.3 g/dL (ref 30.0–36.0)
MCV: 91.1 fL (ref 78.0–100.0)
PLATELETS: 20 10*3/uL — AB (ref 150–400)
RBC: 2.24 MIL/uL — ABNORMAL LOW (ref 4.22–5.81)
RDW: 15.1 % (ref 11.5–15.5)
WBC: 4.8 10*3/uL (ref 4.0–10.5)

## 2016-04-06 LAB — ECHOCARDIOGRAM COMPLETE
Height: 70 in
WEIGHTICAEL: 3195.79 [oz_av]

## 2016-04-06 LAB — HAPTOGLOBIN: HAPTOGLOBIN: 76 mg/dL (ref 34–200)

## 2016-04-06 LAB — BASIC METABOLIC PANEL
Anion gap: 6 (ref 5–15)
BUN: 16 mg/dL (ref 6–20)
CALCIUM: 9.3 mg/dL (ref 8.9–10.3)
CO2: 28 mmol/L (ref 22–32)
CREATININE: 0.52 mg/dL — AB (ref 0.61–1.24)
Chloride: 103 mmol/L (ref 101–111)
Glucose, Bld: 133 mg/dL — ABNORMAL HIGH (ref 65–99)
Potassium: 3.9 mmol/L (ref 3.5–5.1)
SODIUM: 137 mmol/L (ref 135–145)

## 2016-04-06 LAB — HEMOGLOBIN A1C
HEMOGLOBIN A1C: 6 % — AB (ref 4.8–5.6)
MEAN PLASMA GLUCOSE: 126 mg/dL

## 2016-04-06 LAB — PTH, INTACT AND CALCIUM
Calcium, Total (PTH): 12.3 mg/dL — ABNORMAL HIGH (ref 8.7–10.2)
PTH: 5 pg/mL — ABNORMAL LOW (ref 15–65)

## 2016-04-06 LAB — MAGNESIUM: MAGNESIUM: 1.8 mg/dL (ref 1.7–2.4)

## 2016-04-06 MED ORDER — HEPARIN SOD (PORK) LOCK FLUSH 100 UNIT/ML IV SOLN
500.0000 [IU] | INTRAVENOUS | Status: AC | PRN
  Administered 2016-04-06: 500 [IU]

## 2016-04-06 MED ORDER — SODIUM CHLORIDE 0.9 % IV SOLN
30.0000 meq | Freq: Once | INTRAVENOUS | Status: DC
  Filled 2016-04-06: qty 15

## 2016-04-06 MED ORDER — MAGNESIUM SULFATE 2 GM/50ML IV SOLN
2.0000 g | Freq: Once | INTRAVENOUS | Status: DC
  Filled 2016-04-06: qty 50

## 2016-04-06 MED ORDER — SODIUM CHLORIDE 0.9 % IV SOLN
INTRAVENOUS | Status: DC
  Administered 2016-04-06: 11:00:00 via INTRAVENOUS

## 2016-04-06 MED ORDER — METHYLNALTREXONE BROMIDE 12 MG/0.6ML ~~LOC~~ SOLN
12.0000 mg | Freq: Once | SUBCUTANEOUS | Status: AC
  Administered 2016-04-06: 12 mg via SUBCUTANEOUS
  Filled 2016-04-06: qty 0.6

## 2016-04-06 MED ORDER — SORBITOL 70 % SOLN
960.0000 mL | TOPICAL_OIL | Freq: Once | ORAL | Status: AC
  Administered 2016-04-06: 960 mL via RECTAL
  Filled 2016-04-06: qty 240

## 2016-04-06 MED ORDER — BOOST PLUS PO LIQD
237.0000 mL | Freq: Three times a day (TID) | ORAL | Status: DC
  Filled 2016-04-06 (×2): qty 237

## 2016-04-06 MED ORDER — SORBITOL 70 % SOLN
960.0000 mL | TOPICAL_OIL | Freq: Once | ORAL | Status: DC
  Filled 2016-04-06: qty 240

## 2016-04-06 MED ORDER — METOPROLOL TARTRATE 5 MG/5ML IV SOLN: 5.0000 mg | Freq: Four times a day (QID) | INTRAVENOUS | Status: DC | PRN

## 2016-04-06 MED ORDER — POLYETHYLENE GLYCOL 3350 17 G PO PACK: 17.0000 g | PACK | Freq: Every day | ORAL | Status: DC

## 2016-04-06 MED ORDER — MORPHINE SULFATE ER 15 MG PO TBCR: 15.0000 mg | EXTENDED_RELEASE_TABLET | Freq: Two times a day (BID) | ORAL | Status: DC

## 2016-04-06 MED ORDER — SODIUM CHLORIDE 0.9% FLUSH
10.0000 mL | INTRAVENOUS | Status: DC | PRN
  Administered 2016-04-06: 10 mL
  Filled 2016-04-06: qty 40

## 2016-04-06 MED ORDER — LORAZEPAM 2 MG/ML IJ SOLN
2.0000 mg | Freq: Once | INTRAMUSCULAR | Status: AC
  Administered 2016-04-06: 2 mg via INTRAVENOUS
  Filled 2016-04-06: qty 1

## 2016-04-06 MED ORDER — POLYETHYLENE GLYCOL 3350 17 G PO PACK: 17.0000 g | PACK | Freq: Two times a day (BID) | ORAL | Status: DC

## 2016-04-06 NOTE — Progress Notes (Signed)
Report called to [place] at [facility] [phone]. Questions, concerns denied.

## 2016-04-06 NOTE — Consult Note (Signed)
Goals of care family consultation took place to include [place], [place], daughter, 2 sons and a nephew.    Continued discussion that was started last evening with Janett Billow regarding ultimately the goals of care for Mr. Hochstetler.  The unanimous decision is for full comfort with a focus on symptom management for what appears to be an acute EOL situation for Mr. Tonks.  Mr. Spomer is oriented x3 at the moment.  He experienced significant delirium symptoms over night that required medication.  His PO intake is very poor.  Lab results repeated today have not significantly improved despite IV hydration.  HGB 7.0 with platelets of 20.  He is a very high risk of bleed.  He denies acute pain and has been managed with MS contin q 8 hours.  He is contipated, most likely impacted, due to chronic opioid use.  PO and suppository relief have been utilized without effect.  Hospice discussed.  Janett Billow reports that they would like to utilize the services of [facility].  Based on his current clinical condition and significant decline over the past 48 hours as well as the transient uncontrolled delerium especially at night the family agreed to referral for inpatient hospice evaluation for [facility].  Case discussed with attending Dr. Aileen Fass and Palliative physicians Dr. Rhea Pink and Dr. Micheline Rough.  Consensus is to move forward with referral for inpatient hospice and hopefully transfer asap for symptom management and acute EOL care.  Evaluation of current condition, request for full comfort without heroic intervention appears to indicate life expectancy is most likely 7-10 days.  Referral for [facility] placed by CSW.  Will follow as needed.  Kizzie Fantasia, MSN, RN-BC, [facility] Palliative Clinical Specialist

## 2016-04-06 NOTE — Progress Notes (Signed)
  Echocardiogram 2D Echocardiogram has been performed.  Darlina Sicilian M 04/06/2016, 1:09 PM

## 2016-04-06 NOTE — Progress Notes (Signed)
[facility] PROGRESS NOTE    Progress Note  Walter Craig  [id number] DOB: 04/02/1955 DOA: 04/04/2016 PCP: [name], MD     Brief Narrative:   Walter Craig is an 61 y.o. 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 He was seen by the oncologist on the day of admission and was found to have a calcium of 15  Assessment/Plan:   Acute encephalopathy due to Hypercalcemia of malignancy: He was started on aggressive IV fluid hydration his calcium today is 9 he was given calcitonin and Zometa. His calcium today is improved. His hyponatremia and hyperkalemia has resolved. He's had good urine output. Daily standing weights. CT of the head showed no acute metastatic disease.  Awaiting hospice and additive care meeting with family. He has a very poor prognosis as he is not a candidate for chemotherapy oncology was consulted recommended to move to the hospice care out.  Acute confusional state: History QTC is 591 compared to previous EKG which was 433. His potassium is 3.8 alternative greater than 4 will continue IV supplementation. His magnesium was 1.8 which I keep greater than to give him an additional bolus of magnesium IV. Recheck a 12-lead EKG in the morning.  Anemia due to chemotherapy: On admission his hemoglobin was 9.5 question if he was hemoconcentrated at this time, today 7.9. Will continue recheck in the morning. Family denies any signs of hematemesis melena or bright red blood per rectum.  Esophageal cancer, stage IV [facility]): Further management per oncology as an outpatient.    Thrombocytopenia: Previous platelet count on 03/29/2016 was 105, today is 22. We'll avoid heparin relates. DC Xarelto arises thrombocytopenia continues to get worse.  Cancer related pain/  Bone metastasis ([place]) Cont narcotics for pain.  Essential hypertension Cont current meds.  Hx of PE (pulmonary  thromboembolism) (HCC) Hold Xarelto.  Prolonged QT interval Due to hypercalcemia, magnesium 1.8 potassium 3.8 which I keep magnesium above 2 potassium above 4. Check a total EKG at noon. Avoid any prolonged QT medications, including haldol and Seroquel.  Sinus tachycardia: Likely due to dehydration we'll give him an additional liter of normal saline continue to monitor on telemetry.  Severe Constipation: Started on MiraLAX twice a day and given a smog enema this could be contributing to his delirium.  DVT prophylaxis: lovenox Family Communication:none Disposition Plan/Barrier to D/C: home in 5 days when mental status improved. Code Status:     Code Status Orders        Start     Ordered   04/05/16 0038  Limited resuscitation (code)  Continuous    Question Answer Comment  In the event of cardiac or respiratory ARREST: Initiate Code Blue, Call Rapid Response No   In the event of cardiac or respiratory ARREST: Perform CPR No   In the event of cardiac or respiratory ARREST: Perform Intubation/Mechanical Ventilation No   In the event of cardiac or respiratory ARREST: Use NIPPV/BiPAp only if indicated Yes   In the event of cardiac or respiratory ARREST: Administer ACLS medications if indicated Yes   In the event of cardiac or respiratory ARREST: Perform Defibrillation or Cardioversion if indicated Yes      04/05/16 0038    Code Status History    Date Active Date Inactive Code Status Order ID Comments User Context   03/02/2016  9:14 PM 03/06/2016  5:24 PM Full Code [id number]  Ivor Costa, MD ED  Advance Directive Documentation   Flowsheet Row Most Recent Value  Type of Advance Directive  Healthcare Power of Attorney, Living will  Pre-existing out of facility DNR order (yellow form or pink MOST form)  No data  "MOST" Form in Place?  No data        IV Access:    Peripheral IV   Procedures and diagnostic studies:   Dg Chest 2 View  Result Date: 04/04/2016 CLINICAL  DATA:  Acute onset of difficulty swallowing. Confusion and dehydration. Acute onset of shortness of breath and generalized weakness. Initial encounter. EXAM: CHEST  2 VIEW COMPARISON:  Chest radiograph performed 01/05/2016, and CTA of the chest performed 03/02/2016 FINDINGS: The patient's left-sided lung and rib masses are again noted. A smaller right-sided lung and rib mass is also partially characterized. No superimposed focal airspace consolidation is seen. No pleural effusion or pneumothorax identified. The heart is normal in size. A right-sided chest port is noted ending about the mid to distal SVC. Known metastatic disease along the thoracic spine is not well characterized. Clips are noted within the right upper quadrant, reflecting prior cholecystectomy. IMPRESSION: 1. Bilateral lung and rib masses again noted, larger on the left. 2. Known metastatic disease along the thoracic spine is not well characterized on radiograph. Electronically Signed   By: Garald Balding M.D.   On: 04/04/2016 17:32   Ct Head W & Wo Contrast  Result Date: 04/05/2016 CLINICAL DATA:  Esophageal cancer with bone Mets. Fatigue and confusion. EXAM: CT HEAD WITHOUT AND WITH CONTRAST TECHNIQUE: Contiguous axial images were obtained from the base of the skull through the vertex without and with intravenous contrast CONTRAST:  75mL ISOVUE-300 IOPAMIDOL (ISOVUE-300) INJECTION 61% COMPARISON:  None. FINDINGS: Brain: No evidence of acute infarction, hemorrhage, hydrocephalus, extra-axial collection or mass lesion/mass effect. Patchy low-density in the cerebral white matter, likely chronic microvascular disease given vascular risk factors. 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 Major vessels are patent. Skull: Multiple lytic lesions consistent with metastatic disease, also seen on recent bone scan. Notable lesion in the right frontal and right parietal bone with erosion of the inner  table, both measuring up to 1 cm. Notable lesion in the upper right occipital condyles near but not and involving the hypoglossal foramen. There are multiple small calvarial lucencies which could be metastasis or arachnoid granulation/venous lakes. Sinuses/Orbits: No acute finding IMPRESSION: 1. No acute finding or evidence of parenchymal metastasis. 2. 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 parietal bone metastases erode the inner table. 3. 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 Electronically Signed   By: Monte Fantasia M.D.   On: 04/05/2016 11:26     Medical Consultants:    None.  Anti-Infectives:   None  Subjective:    [place] Setting [place] confused.  Objective:    Vitals:   04/05/16 1819 04/05/16 2116 04/06/16 0508 04/06/16 0758  BP: 129/85 (!) 155/87 118/82 (!) 147/92  Pulse: (!) 120 (!) 128 (!) 111 (!) 136  Resp: 18 (!) 24 20 20   Temp: 98 F (36.7 C) 99.3 F (37.4 C) 98.1 F (36.7 C) 98.3 F (36.8 C)  TempSrc: Oral Oral Oral Oral  SpO2: 94% 93% 94% 93%  Weight:      Height:        Intake/Output Summary (Last 24 hours) at 04/06/16 0844 Last data filed at 04/06/16 [id number]  Gross per 24 hour  Intake  342 ml  Output             2050 ml  Net            -1708 ml   Filed Weights   04/04/16 1637 04/05/16 0022  Weight: 88.5 kg (195 lb) 90.6 kg (199 lb 11.8 oz)    Exam: General exam: In no acute distress. Respiratory system: Good air movement and clear to auscultation. Cardiovascular system: S1 & S2 heard, RRR. No JVD. Gastrointestinal system: Abdomen is nondistended, soft and nontender.  Central nervous system: Alert and oriented. No focal neurological deficits. Extremities: No pedal edema. Skin: No rashes, lesions or ulcers Psychiatry: no Judgement and no insight.    Data Reviewed:    Labs: Basic Metabolic Panel:  Recent Labs Lab 04/04/16 1446  04/04/16 1750 04/05/16 0145 04/05/16 0654 04/06/16 0458  NA  136  --  134*  --  134* 137  K 4.0  < > 3.7  --  3.8 3.9  CL  --   --  97*  --  100* 103  CO2 28  --  28  --  27 28  GLUCOSE 125  --  108*  --  133* 133*  BUN 30.6*  --  33*  --  26* 16  CREATININE 0.9  --  0.99  --  0.72 0.52*  CALCIUM 14.0*  --  13.5* 12.3* 11.2* 9.3  MG  --   --   --   --  1.8  --   PHOS  --   --   --   --  3.0  --   < > = values in this interval not displayed. GFR Estimated Creatinine Clearance: 112.5 mL/min (by C-G formula based on SCr of 0.52 mg/dL (L)). Liver Function Tests:  Recent Labs Lab 04/04/16 1446 04/04/16 1750 04/05/16 0654  AST 45* 45* 48*  ALT 37 35 34  ALKPHOS 190* 145* 140*  BILITOT 1.00 1.1 0.7  PROT 5.8* 5.6* 5.3*  ALBUMIN 2.6* 2.5* 2.4*   No results for input(s): LIPASE, AMYLASE in the last 168 hours. No results for input(s): AMMONIA in the last 168 hours. Coagulation profile No results for input(s): INR, PROTIME in the last 168 hours.  CBC:  Recent Labs Lab 04/04/16 1445 04/04/16 1750 04/05/16 0654 04/06/16 0458  WBC 5.5 6.0 5.6 4.8  NEUTROABS 4.5 4.9  --   --   HGB 10.3* 9.5* 7.9* 7.0*  HCT 30.5* 27.8* 23.6* 20.4*  MCV 90.7 90.3 88.4 91.1  PLT 48* PLATELET CLUMPS NOTED ON SMEAR, COUNT APPEARS DECREASED 22* 20*   Cardiac Enzymes: No results for input(s): CKTOTAL, CKMB, CKMBINDEX, TROPONINI in the last 168 hours. BNP (last 3 results) No results for input(s): PROBNP in the last 8760 hours. CBG: No results for input(s): GLUCAP in the last 168 hours. D-Dimer: No results for input(s): DDIMER in the last 72 hours. Hgb A1c:  Recent Labs  04/05/16 0654  HGBA1C 6.0*   Lipid Profile: No results for input(s): CHOL, HDL, LDLCALC, TRIG, CHOLHDL, LDLDIRECT in the last 72 hours. Thyroid function studies:  Recent Labs  04/05/16 0654  TSH 0.952   Anemia work up: No results for input(s): VITAMINB12, FOLATE, FERRITIN, TIBC, IRON, RETICCTPCT in the last 72 hours. Sepsis Labs:  Recent Labs Lab 04/04/16 1445  04/04/16 1750 04/05/16 0654 04/06/16 0458  WBC 5.5 6.0 5.6 4.8   Microbiology No results found for this or any previous visit (from the past 240 hour(s)).   Medications:   .  calcitonin  354 Units Subcutaneous BID  . dexamethasone  2 mg Oral BID  . lactose free nutrition  237 mL Oral TID WC  . metoprolol succinate  25 mg Oral Daily  . morphine  15 mg Oral Q8H  . multivitamin with minerals  1 tablet Oral Daily  . polyethylene glycol  17 g Oral BID  . rivaroxaban  20 mg Oral Daily  . senna  1 tablet Oral BID  . sucralfate  1 g Oral QID  . zolpidem  10 mg Oral QHS   Continuous Infusions:   Time spent: 35 min   LOS: 2 days   Charlynne Cousins  [facility] Pager [phone]  *Please refer to [email or web address], password [email or web address] to get updated schedule on who will round on this patient, as hospitalists switch teams weekly. If 7PM-7AM, please contact night-coverage at [email or web address], password [email or web address] for any overnight needs.  04/06/2016, 8:44 AM

## 2016-04-06 NOTE — Progress Notes (Signed)
CSW received consult from Joslyn Devon, PMT RN for residential hospice placement, that family expressed interest in [facility]. CSW made referral to [place], Stac[place] - awaiting response re: bed availability/eligibility.    Raynaldo Opitz, [facility] Clinical Social Worker cell #: [phone]

## 2016-04-06 NOTE — Progress Notes (Signed)
Patient has had a very difficult night. Has continued with confusion and increased agitation as night has progressed.  Has taken mitts off several times and still attempts to pull at line and tubes. Son at bedside also very anxious and very concerned about his Dad's condition; He's having a difficult time coping.  NP on call notified and orders received for IV Ativan. Had to call a second time for another dose to finally get patient some relief. Will continue to monitor pt.

## 2016-04-06 NOTE — Progress Notes (Signed)
OT Cancellation Note  Patient Details Name: Walter Craig MRN: [id number] DOB: [birth year]   Cancelled Treatment:    Reason Eval/Treat Not Completed: Medical issues which prohibited therapy;Fatigue/lethargy limiting ability to participate;Other (comment). Spoke with pt's daughter and family would just like to keep pt comfortable and resting at this time and that they are planing for hospice placement  Britt Bottom 04/06/2016, 11:59 AM

## 2016-04-06 NOTE — Progress Notes (Signed)
Enema given, pt tolerated well. BM passed

## 2016-04-06 NOTE — Progress Notes (Signed)
PT Cancellation Note  Patient Details Name: Walter Craig MRN: [id number] DOB: Jul 28, 1955   Cancelled Treatment:    Reason Eval/Treat Not Completed: Family has decided on residential hospice. Goal is comfort care. Will sign off.    Weston Anna, MPT Pager: [phone]

## 2016-04-06 NOTE — Progress Notes (Signed)
[facility] called for transport.     Davonne Jarnigan, LCSW [facility] Clinical Social Worker cell #: [phone]  

## 2016-04-06 NOTE — Progress Notes (Signed)
[facility] Liaison:  RN  Received request from [place], [place], for patient/family interest in [facility] with request for transfer today.  Chart reviewed.  Spoke with patient's daughter, Janett Billow over the phone to confirm interest.  Stacie, RN will meet with family at 2:30pm today to explain services and get registration paperwork completed.  Family agreeable to transfer today.  CSW aware.  Dr. Orpah Melter to assume care per family request.  Please fax discharge summary to [phone].  Rn, please call report to [phone].  Please arrange transport for patient to arrive as soon as possible.   Thank you,  Edyth Gunnels, RN, [facility] Liaison [phone]  All hospital liaison's are now on [place].  Please feel free to contact me at the above number or call hospice at [phone] after 5pm.

## 2016-04-06 NOTE — Progress Notes (Signed)
Chaplain consulted to assist family in completing Last Will and Testament and [place].     Walter Going, RN, Palliative assessed pt for competency and in consultation with her as well as assessment at bedside, it is determined that pt is able to demonstrate orientation and is aware of scope of paperwork.   Daughter explained paperwork with pt.  Pt signed paperwork in presence of witnesses. Notarized by chaplain.      [place], [place]

## 2016-04-06 NOTE — Discharge Summary (Addendum)
Physician Discharge Summary  Walter Craig [id number] DOB: 15-Oct-1955 DOA: 04/04/2016  PCP: [name], MD  Admit date: 04/04/2016 Discharge date: 04/06/2016  Admitted From: home Disposition:  home  Recommendations for Outpatient Follow-up:  1. Will go to Residential hospice with full comfort.  Home Health:No Equipment/Devices:None  Discharge Condition:Stable CODE STATUS:DNR Diet recommendation: Comfort feed  Brief/Interim Summary: 61 y.o. 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 He was seen by the oncologist on the day of admission and was found to have a calcium of 15  Discharge Diagnoses:  Active Problems:   Esophageal cancer, stage IV (HCC)   Hypercalcemia of malignancy   Cancer related pain   Essential hypertension   PE (pulmonary thromboembolism) (HCC)   Bone metastasis (HCC)   Dehydration   Dysphagia   Prolonged QT interval   Hypercalcemia   Metabolic encephalopathy   Protein-calorie malnutrition, severe  Metabolic encephalopathy due to Hypercalcemia of malignancy: He was started on aggressive IV fluid hydration and  he was given calcitonin and Zometa his calcium today is 9. His hyponatremia and hyperkalemia has resolved. CT of the head showed no acute metastatic disease.  hospice and Palliative care talk with family and they decided to move toward comfort care. He will be transfer to residential hospice. He was started on roxanol. I do not think the patient return to baseline accordingly to the family. Life expectancy is less than 1 week. Poor oral intake and deteriorating.  Acute confusional state: History QTC is 591 compared to previous EKG which was 433. His potassium was kept closer to 4 and Ma > 2.0  Anemia due to chemotherapy: On admission his hemoglobin was 9.5 question if he was hemoconcentration. Family denies any signs of hematemesis melena or bright red blood  per rectum.  Esophageal cancer, stage IV [facility]): Further management per oncology as an outpatient.    Thrombocytopenia: Previous platelet count on 03/29/2016 was 105, today is 22.  DC Xarelto due to thrombocytopenia.  Cancer related pain/  Bone metastasis ([place]) Cont narcotics for pain.  Essential hypertension Cont current meds.  Hx of PE (pulmonary thromboembolism) (HCC) Hold Xarelto.  Prolonged QT interval Due to hypercalcemia, magnesium 1.8 potassium 3.8, keep magnesium above 2 potassium above 4.  Sinus tachycardia:  Severe Constipation: Given miralax and relistor, this could be contributing to his delirium.   Discharge Instructions  Discharge Instructions    Diet - low sodium heart healthy    Complete by:  As directed    Increase activity slowly    Complete by:  As directed      Allergies as of 04/06/2016   No Known Allergies     Medication List    STOP taking these medications   cyclobenzaprine 5 MG tablet Commonly known as:  FLEXERIL   dexamethasone 4 MG tablet Commonly known as:  DECADRON   emollient cream Commonly known as:  BIAFINE   Lactulose 20 GM/30ML Soln   lidocaine-prilocaine cream Commonly known as:  EMLA   metoprolol succinate 25 MG 24 hr tablet Commonly known as:  TOPROL-XL   ondansetron 8 MG tablet Commonly known as:  ZOFRAN   rivaroxaban 20 MG Tabs tablet Commonly known as:  XARELTO   sucralfate 1 g tablet Commonly known as:  CARAFATE   zolpidem 10 MG tablet Commonly known as:  AMBIEN     TAKE these medications   morphine CONCENTRATE 10 MG/0.5ML Soln concentrated solution Take 0.25-0.5 mLs (  5-10 mg total) by mouth every 4 (four) hours as needed for severe pain. What changed:  Another medication with the same name was removed. Continue taking this medication, and follow the directions you see here.   polyethylene glycol powder powder Commonly known as:  MIRALAX TAKE 6 CAPFULS OF MIRALAX IN A 32 OUNCE GATORADE AND  DRINK THE WHOLE BEVERAGE FOLLOWED BY 3 CAPFULS TWICE A DAY FOR THE NEXT WEEK AND FOLLOW UP WITH YOUR PRIMARY CARE PHYSICIAN.       No Known Allergies  Consultations:  PMT   Procedures/Studies: Dg Chest 2 View  Result Date: 04/04/2016 CLINICAL DATA:  Acute onset of difficulty swallowing. Confusion and dehydration. Acute onset of shortness of breath and generalized weakness. Initial encounter. EXAM: CHEST  2 VIEW COMPARISON:  Chest radiograph performed 01/05/2016, and CTA of the chest performed 03/02/2016 FINDINGS: The patient's left-sided lung and rib masses are again noted. A smaller right-sided lung and rib mass is also partially characterized. No superimposed focal airspace consolidation is seen. No pleural effusion or pneumothorax identified. The heart is normal in size. A right-sided chest port is noted ending about the mid to distal SVC. Known metastatic disease along the thoracic spine is not well characterized. Clips are noted within the right upper quadrant, reflecting prior cholecystectomy. IMPRESSION: 1. Bilateral lung and rib masses again noted, larger on the left. 2. Known metastatic disease along the thoracic spine is not well characterized on radiograph. Electronically Signed   By: Garald Balding M.D.   On: 04/04/2016 17:32   Ct Head W & Wo Contrast  Result Date: 04/05/2016 CLINICAL DATA:  Esophageal cancer with bone Mets. Fatigue and confusion. EXAM: CT HEAD WITHOUT AND WITH CONTRAST TECHNIQUE: Contiguous axial images were obtained from the base of the skull through the vertex without and with intravenous contrast CONTRAST:  45mL ISOVUE-300 IOPAMIDOL (ISOVUE-300) INJECTION 61% COMPARISON:  None. FINDINGS: Brain: No evidence of acute infarction, hemorrhage, hydrocephalus, extra-axial collection or mass lesion/mass effect. Patchy low-density in the cerebral white matter, likely chronic microvascular disease given vascular risk factors. Vascular: Bulbous appearance of the anterior  communicating artery region, probable 4 mm aneurysm. Major vessels are patent. Skull: Multiple lytic lesions consistent with metastatic disease, also seen on recent bone scan. Notable lesion in the right frontal and right parietal bone with erosion of the inner table, both measuring up to 1 cm. Notable lesion in the upper right occipital condyles near but not and involving the hypoglossal foramen. There are multiple small calvarial lucencies which could be metastasis or arachnoid granulation/venous lakes. Sinuses/Orbits: No acute finding IMPRESSION: 1. No acute finding or evidence of parenchymal metastasis. 2. 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 parietal bone metastases erode the inner table. 3. 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 Electronically Signed   By: Monte Fantasia M.D.   On: 04/05/2016 11:26    Subjective: Confused.  Discharge Exam: Vitals:   04/06/16 0758 04/06/16 0930  BP: (!) 147/92 138/88  Pulse: (!) 136 (!) 126  Resp: 20 18  Temp: 98.3 F (36.8 C) 98.8 F (37.1 C)   Vitals:   04/05/16 2116 04/06/16 0508 04/06/16 0758 04/06/16 0930  BP: (!) 155/87 118/82 (!) 147/92 138/88  Pulse: (!) 128 (!) 111 (!) 136 (!) 126  Resp: (!) 24 20 20 18   Temp: 99.3 F (37.4 C) 98.1 F (36.7 C) 98.3 F (36.8 C) 98.8 F (37.1 C)  TempSrc: Oral Oral Oral Oral  SpO2: 93% 94% 93% 98%  Weight:      Height:        General: Pt is alert, awake, not in acute distress Cardiovascular: RRR, S1/S2 +, no rubs, no gallops Respiratory: CTA bilaterally, no wheezing, no rhonchi Abdominal: Soft, NT, ND, bowel sounds + Extremities: no edema, no cyanosis    The results of significant diagnostics from this hospitalization (including imaging, microbiology, ancillary and laboratory) are listed below for reference.     Microbiology: No results found for this or any previous visit (from the past 240 hour(s)).   Labs: BNP (last 3 results)  Recent Labs   03/03/16 0348  BNP 0000000   Basic Metabolic Panel:  Recent Labs Lab 04/04/16 1446 04/04/16 1750 04/05/16 0145 04/05/16 0654 04/06/16 0458  NA 136 134*  --  134* 137  K 4.0 3.7  --  3.8 3.9  CL  --  97*  --  100* 103  CO2 28 28  --  27 28  GLUCOSE 125 108*  --  133* 133*  BUN 30.6* 33*  --  26* 16  CREATININE 0.9 0.99  --  0.72 0.52*  CALCIUM 14.0* 13.5* 12.3* 11.2* 9.3  MG  --   --   --  1.8 1.8  PHOS  --   --   --  3.0  --    Liver Function Tests:  Recent Labs Lab 04/04/16 1446 04/04/16 1750 04/05/16 0654  AST 45* 45* 48*  ALT 37 35 34  ALKPHOS 190* 145* 140*  BILITOT 1.00 1.1 0.7  PROT 5.8* 5.6* 5.3*  ALBUMIN 2.6* 2.5* 2.4*   No results for input(s): LIPASE, AMYLASE in the last 168 hours. No results for input(s): AMMONIA in the last 168 hours. CBC:  Recent Labs Lab 04/04/16 1445 04/04/16 1750 04/05/16 0654 04/06/16 0458  WBC 5.5 6.0 5.6 4.8  NEUTROABS 4.5 4.9  --   --   HGB 10.3* 9.5* 7.9* 7.0*  HCT 30.5* 27.8* 23.6* 20.4*  MCV 90.7 90.3 88.4 91.1  PLT 48* PLATELET CLUMPS NOTED ON SMEAR, COUNT APPEARS DECREASED 22* 20*   Cardiac Enzymes: No results for input(s): CKTOTAL, CKMB, CKMBINDEX, TROPONINI in the last 168 hours. BNP: Invalid input(s): POCBNP CBG: No results for input(s): GLUCAP in the last 168 hours. D-Dimer No results for input(s): DDIMER in the last 72 hours. Hgb A1c  Recent Labs  04/05/16 0654  HGBA1C 6.0*   Lipid Profile No results for input(s): CHOL, HDL, LDLCALC, TRIG, CHOLHDL, LDLDIRECT in the last 72 hours. Thyroid function studies  Recent Labs  04/05/16 0654  TSH 0.952   Anemia work up No results for input(s): VITAMINB12, FOLATE, FERRITIN, TIBC, IRON, RETICCTPCT in the last 72 hours. Urinalysis    Component Value Date/Time   COLORURINE YELLOW 03/25/2016 1730   APPEARANCEUR CLEAR 03/25/2016 1730   LABSPEC 1.018 03/25/2016 1730   PHURINE 5.0 03/25/2016 1730   GLUCOSEU 50 (A) 03/25/2016 1730   HGBUR NEGATIVE  03/25/2016 1730   BILIRUBINUR NEGATIVE 03/25/2016 1730   KETONESUR NEGATIVE 03/25/2016 1730   PROTEINUR NEGATIVE 03/25/2016 1730   NITRITE NEGATIVE 03/25/2016 1730   LEUKOCYTESUR NEGATIVE 03/25/2016 1730   Sepsis Labs Invalid input(s): PROCALCITONIN,  WBC,  LACTICIDVEN Microbiology No results found for this or any previous visit (from the past 240 hour(s)).   Time coordinating discharge: Over 30 minutes  SIGNED:   Charlynne Cousins, MD  [facility] 04/06/2016, 6:01 PM Pager   If 7PM-7AM, please contact night-coverage [email or web address] Password [email or web address]

## 2016-04-06 NOTE — Care Management Note (Signed)
Case Management Note  Patient Details  Name: Walter Craig MRN: [id number] Date of Birth: December 29, 1955  Subjective/Objective:Noted for palliative cons today-await recc.                    Action/Plan:d/c plan home.   Expected Discharge Date:   (unknown)               Expected Discharge Plan:  Home/Self Care  In-House Referral:     Discharge planning Services  Medication Assistance  Post Acute Care Choice:    Choice offered to:     DME Arranged:    DME Agency:     HH Arranged:    HH Agency:     Status of Service:  In process, will continue to follow  If discussed at Long Length of Stay Meetings, dates discussed:    Additional Comments:  Dessa Phi, RN 04/06/2016, 11:04 AM

## 2016-04-06 NOTE — Progress Notes (Signed)
NUTRITION NOTE  Pt was seen by RD yesterday with associated note at 1209. Reviewed note from that time yesterday through this AM. Dr. Burr Medico met with pt and his family at bedside yesterday. Her note indicates: -Patient's overall condition has dramatically deteriorated in the past week, likely secondary to cancer progression.  -Unfortunately is not a candidate for chemotherapy due to his extremely poor performance status  -I anticipate his life expectancy would be likely a few days to a few weeks  -I recommend hospice and comfort care. He is probably eligible for inpatient hospice  -Patient previously has clearly indicated his palliative goal of care, and DNR/DNI  -I spoke with his daughter and son, they are in agreement with comfort care  -palliative care consult has been called, he will likely transition to hospice    Pt was seen by SLP saw pt yesterday afternoon and recommended Dysphagia 3, thin liquids diet. Palliative Care RN had a phone conversation with pt's daughter yesterday evening and note from that discussion states: Phone conversation with POA, daughter Janett Billow.  Palliative concepts discussed.  Patient history and wishes are clear to family.  Goal is for comfort and symptom management for EOL care. Plan for full family meeting per Palliative Care today.   Will order Will order Boost Plus TID, each supplement provides 360 kcal and 14 grams of protein, as pt was drinking this supplement PTA. RD will follow-up Monday to assess GOC and if further intervention is warranted at that time; although from notes it sounds that pt is likely to be made comfort care.    Jarome Matin, MS, RD, LDN, [facility] Inpatient Clinical Dietitian Pager # [phone] After hours/weekend pager # [phone]

## 2016-04-06 NOTE — Progress Notes (Signed)
Chaplain visited patient's room as referred by [facility].  Patient lying in bed resting.  Chaplain was met at the door by patient's family member.  Family member expressed that they were waiting on a call from Hospice and that they would be moving there soon.  Family member also expressed that [facility] had been by and that he took care of their needs previously and were appreciative of his service.   Family thanked me for coming by and thanked everyone for being so nice.  No additional needs at this time.  Family seems fine, just waiting to move to [facility] for further Hospice care.  Berneice Heinrich. Chaplain Resident Pager:  [phone]    04/06/16 1410  Clinical Encounter Type  Visited With Family  Visit Type Follow-up;Spiritual support  Referral From Chaplain Financial trader )  Consult/Referral To Chaplain

## 2016-04-06 NOTE — Progress Notes (Signed)
CSW received call back from [facility], Amy that they have a bed for patient to go today. Awaiting dc summary & admission paperwork to be completed.    Raynaldo Opitz, [facility] Clinical Social Worker cell #: [phone]

## 2016-04-09 LAB — PTH-RELATED PEPTIDE: PTH-related peptide: 1.9 pmol/L

## 2016-04-10 ENCOUNTER — Ambulatory Visit: Payer: BLUE CROSS/BLUE SHIELD | Admitting: Hematology

## 2016-04-10 ENCOUNTER — Other Ambulatory Visit: Payer: BLUE CROSS/BLUE SHIELD

## 2016-04-10 ENCOUNTER — Ambulatory Visit: Payer: BLUE CROSS/BLUE SHIELD

## 2016-04-13 ENCOUNTER — Other Ambulatory Visit: Payer: Self-pay | Admitting: Hematology

## 2016-05-03 DEATH — deceased

## 2016-05-14 NOTE — Progress Notes (Addendum)
Walter Craig 61 y.o. man  with Esophageal cancer, Stage IV   radiation completed 04-02-16 one month FU.

## 2016-05-22 ENCOUNTER — Telehealth: Payer: Self-pay | Admitting: *Deleted

## 2016-05-22 NOTE — Telephone Encounter (Signed)
Called patient to alter fu appt. Per Jarrett Ables, nurse for Walter Craig, appt. moved to 10 am on 05-23-16, lvm for a return call

## 2016-05-23 ENCOUNTER — Ambulatory Visit
Admission: RE | Admit: 2016-05-23 | Discharge: 2016-05-23 | Disposition: A | Discharge status: Home / Self Care | Payer: BLUE CROSS/BLUE SHIELD | Source: Ambulatory Visit | Attending: Radiation Oncology | Admitting: Radiation Oncology

## 2016-05-23 ENCOUNTER — Encounter: Payer: Self-pay | Admitting: *Deleted

## 2016-05-23 ENCOUNTER — Encounter: Payer: Self-pay | Admitting: Radiation Oncology

## 2016-05-23 NOTE — Progress Notes (Signed)
1000 Called left a message about his 1000 appointment for today with Alsion Dara Lords, P.A..  Asked to please give [place] a call back at [phone] and ask for  Nurse Londyn Hotard. 10010  Spoke with [place] in scheduling will reschedule his follow up and send him a letter in reference to his appointment.

## 2016-06-01 ENCOUNTER — Telehealth: Payer: Self-pay | Admitting: *Deleted

## 2016-06-01 NOTE — Telephone Encounter (Signed)
Called patient to alter fu on 06-06-16 per Shona Simpson, appt. Moved to 06-05-16 @ 2 pm, lvm for a return call

## 2016-06-05 ENCOUNTER — Ambulatory Visit
Admission: RE | Admit: 2016-06-05 | Payer: BLUE CROSS/BLUE SHIELD | Source: Ambulatory Visit | Admitting: Radiation Oncology

## 2016-06-06 ENCOUNTER — Ambulatory Visit: Payer: BLUE CROSS/BLUE SHIELD | Admitting: Radiation Oncology

## 2016-06-08 NOTE — Progress Notes (Signed)
1650 Scheduling notified not to reschedule for a follow up he has enrolled in hospice.care.

## 2017-06-18 IMAGING — CT CT HEAD WO/W CM
3 of 4 series · 14 of 47 positions shown, 16 images · IV contrast (iopamidol)
Comparison: None.

CLINICAL DATA: Esophageal cancer with bone Mets. Fatigue and
confusion.

EXAM:
CT HEAD WITHOUT AND WITH CONTRAST
TECHNIQUE: Contiguous axial images were obtained from the base of the skull
through the vertex without and with intravenous contrast
CONTRAST:  75mL SPNSY2-PPP IOPAMIDOL (SPNSY2-PPP) INJECTION 61%

[Series 2: head wo · axial · 0.47mm/px · z∈[+1188,+1318]mm · 8 of 32 slices shown, 10 images]
[im 3/32  brain]
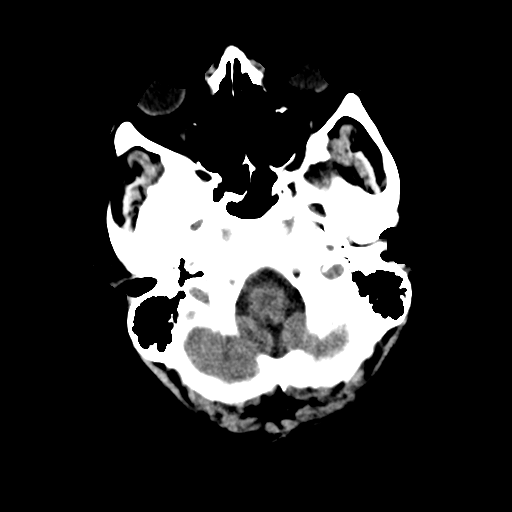
[im 3/32  bone]
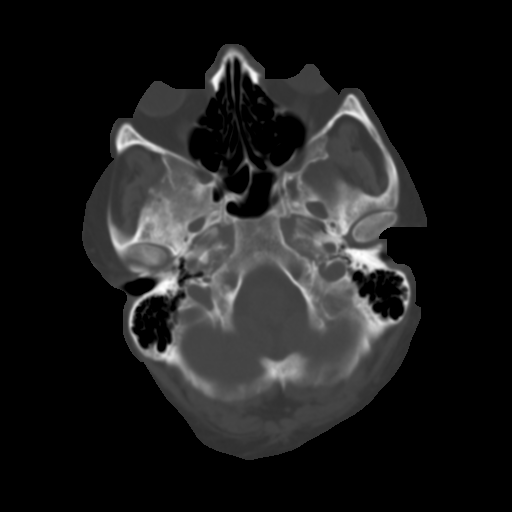
[im 7/32  brain]
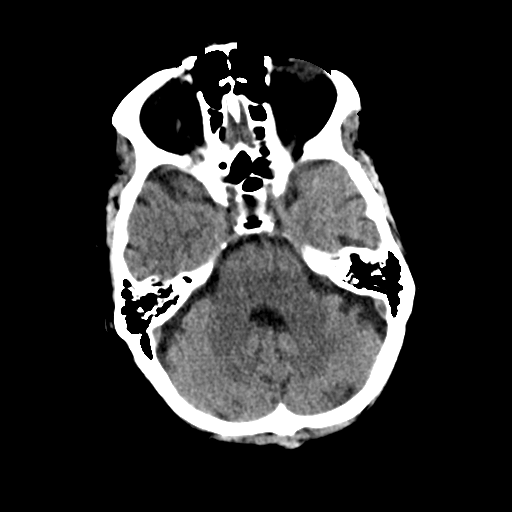
[im 12/32  brain]
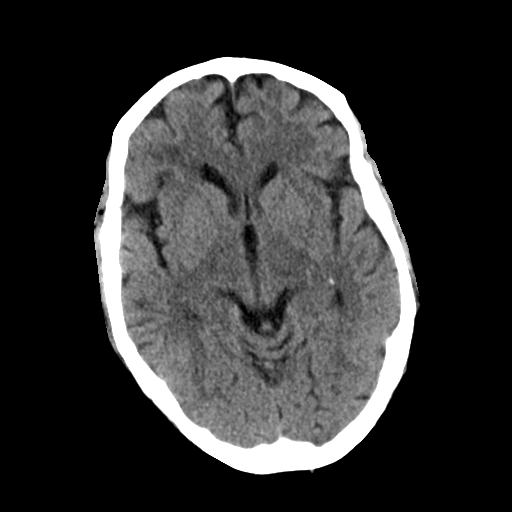
[im 14/32  brain]
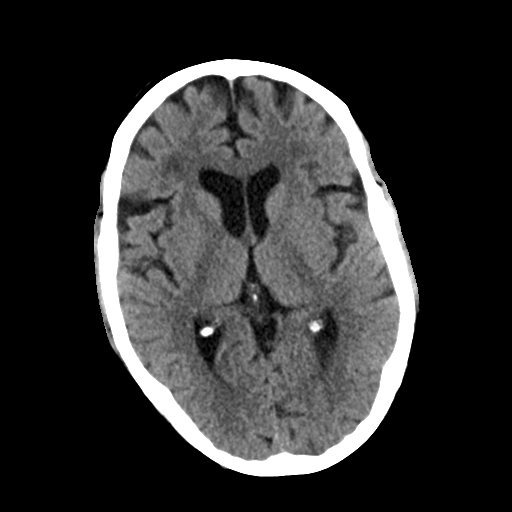
[im 18/32  brain]
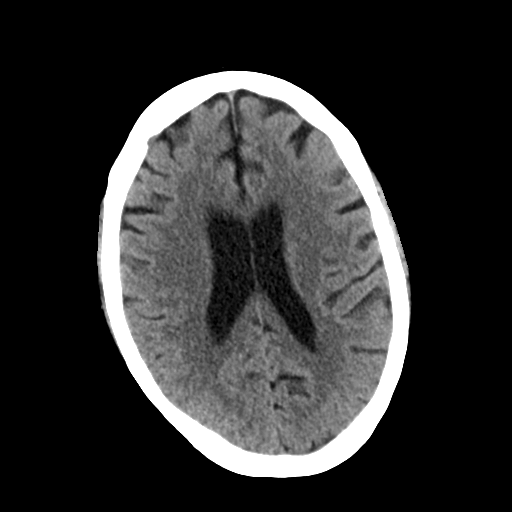
[im 18/32  bone]
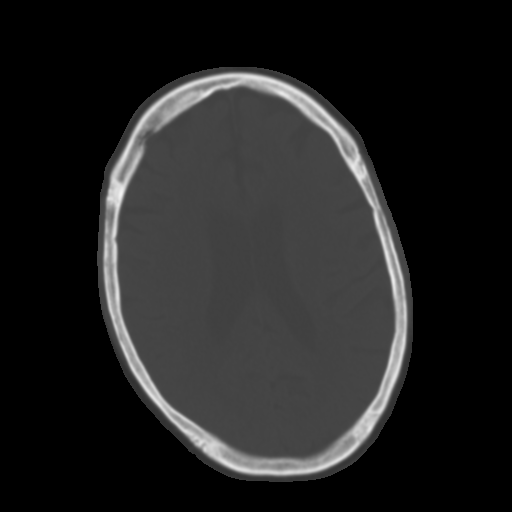
[im 20/32  brain]
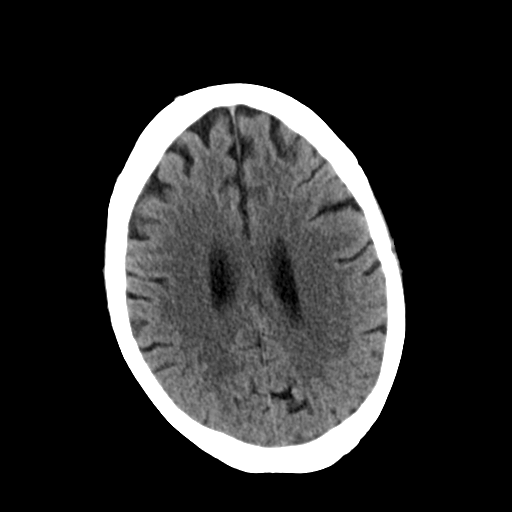
[im 25/32  brain]
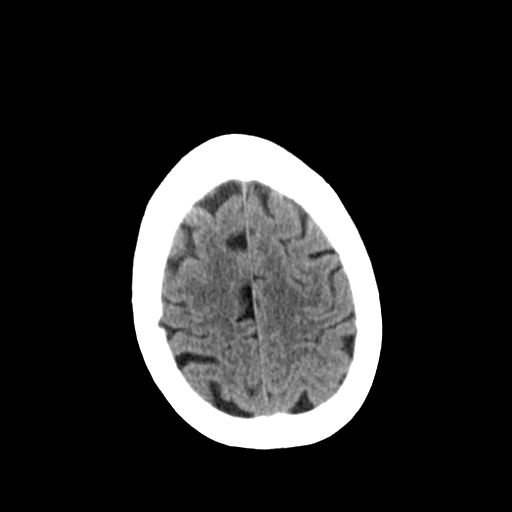
[im 29/32  brain]
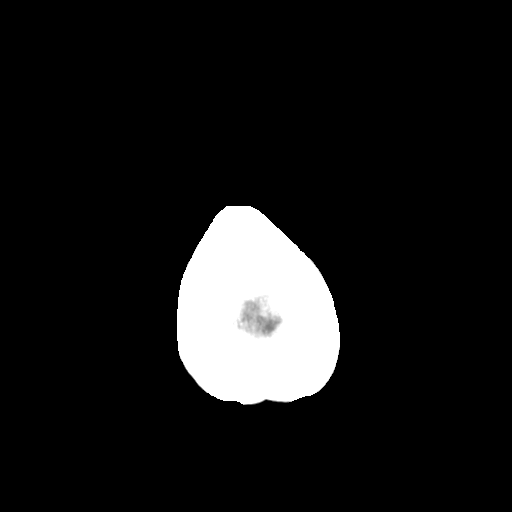

[Series 5: coronal soft tissue · coronal · 0.30mm/px · 3 of 75 slices shown]
[im 25/75  brain]
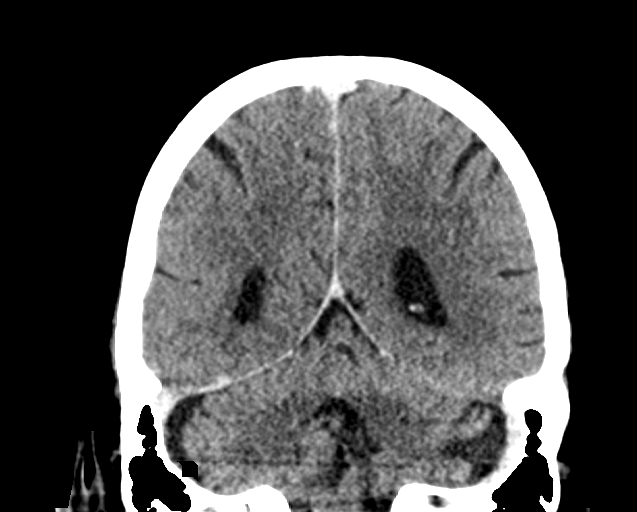
[im 33/75  brain]
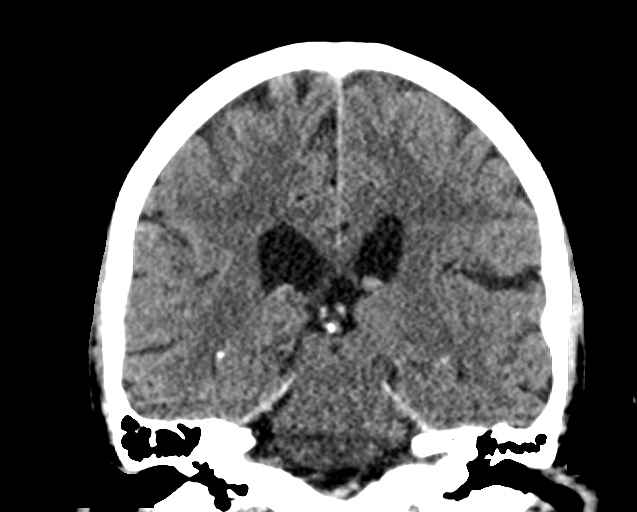
[im 42/75  brain]
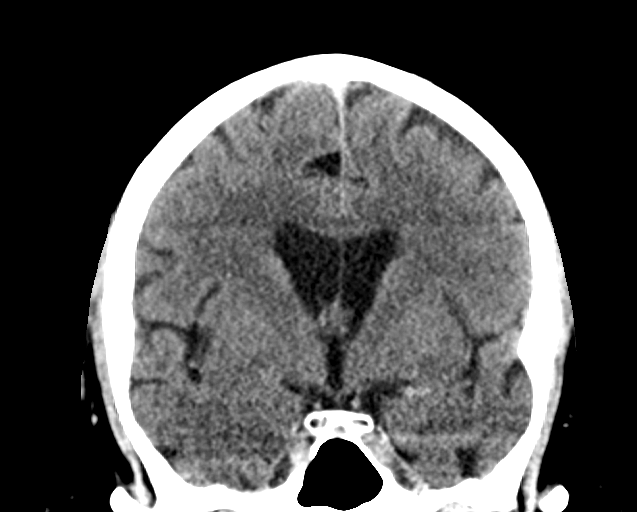

[Series 6: sagittal soft tissue · sagittal · 0.30mm/px · 3 of 61 slices shown]
[im 21/61  brain]
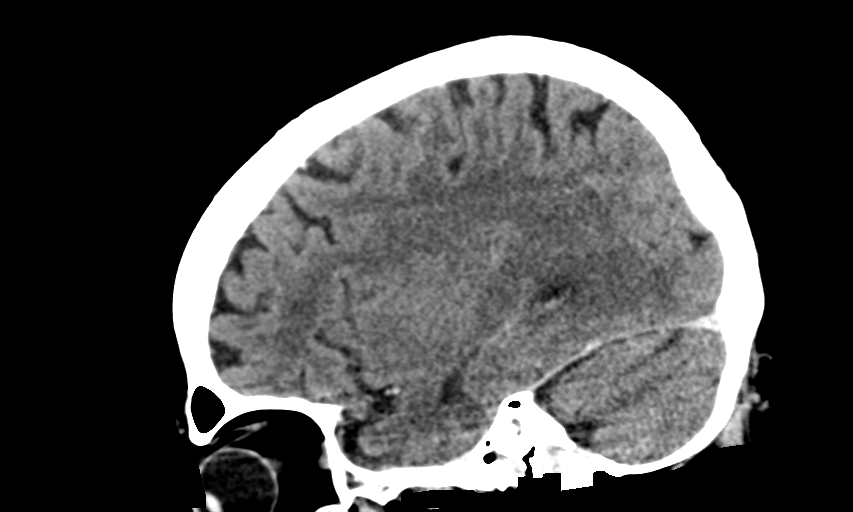
[im 31/61  brain]
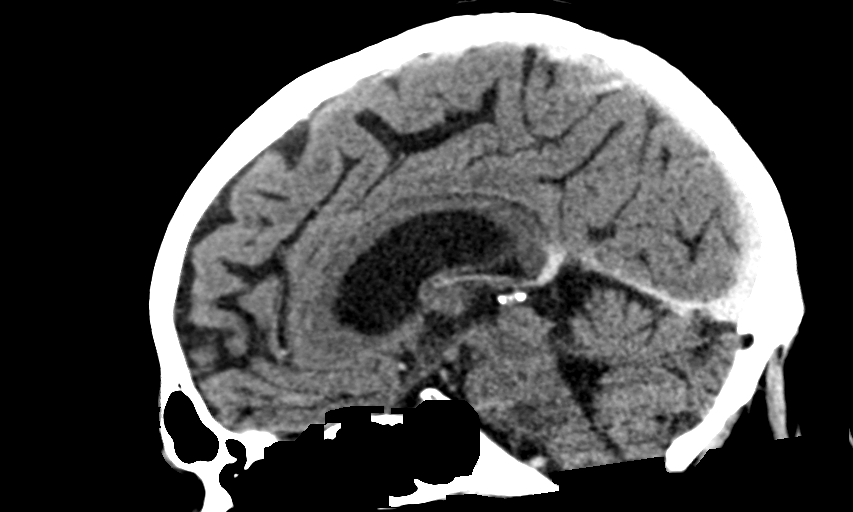
[im 41/61  brain]
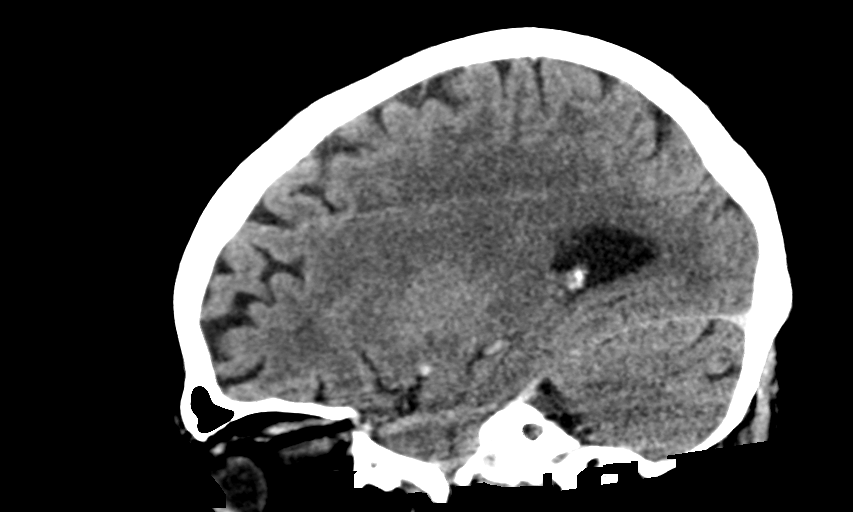

[14 of 47 positions shown; findings below may reference images not displayed]

FINDINGS: Brain: No evidence of acute infarction, hemorrhage, hydrocephalus,
extra-axial collection or mass lesion/mass effect. Patchy
low-density in the cerebral white matter, likely chronic
microvascular disease given vascular risk factors.

Vascular: Bulbous appearance of the anterior communicating artery
region, probable 4 mm aneurysm. Major vessels are patent.

Skull: Multiple lytic lesions consistent with metastatic disease,
also seen on recent bone scan. Notable lesion in the right frontal
and right parietal bone with erosion of the inner table, both
measuring up to 1 cm. Notable lesion in the upper right occipital
condyles near but not and involving the hypoglossal foramen. There
are multiple small calvarial lucencies which could be metastasis or
arachnoid granulation/venous lakes.

Sinuses/Orbits: No acute finding
IMPRESSION: 1. No acute finding or evidence of parenchymal metastasis.
2. Multiple osseous metastases in the skull. Right frontal and right
parietal bone metastases erode the inner table.
3. Suspected 4 mm anterior communicating artery aneurysm.
